# Patient Record
Sex: Female | Born: 1935 | Race: Black or African American | Hispanic: No | Smoking: Never smoker
Health system: Southern US, Community
[De-identification: ages and names within clinical notes are randomized; demographics above are authoritative.]

## PROBLEM LIST (undated history)

## (undated) DIAGNOSIS — F0280 Dementia in other diseases classified elsewhere without behavioral disturbance: Secondary | ICD-10-CM

## (undated) DIAGNOSIS — C801 Malignant (primary) neoplasm, unspecified: Secondary | ICD-10-CM

## (undated) DIAGNOSIS — I4891 Unspecified atrial fibrillation: Secondary | ICD-10-CM

## (undated) DIAGNOSIS — G309 Alzheimer's disease, unspecified: Secondary | ICD-10-CM

## (undated) HISTORY — PX: COLON SURGERY: SHX602

## (undated) HISTORY — PX: APPENDECTOMY: SHX54

## (undated) HISTORY — PX: CHOLECYSTECTOMY: SHX55

## (undated) HISTORY — PX: TONSILLECTOMY: SUR1361

---

## 1998-06-29 ENCOUNTER — Ambulatory Visit (HOSPITAL_COMMUNITY): Admission: RE | Admit: 1998-06-29 | Discharge: 1998-06-29 | Payer: Self-pay | Admitting: General Surgery

## 2000-01-01 ENCOUNTER — Ambulatory Visit (HOSPITAL_COMMUNITY): Admission: RE | Admit: 2000-01-01 | Discharge: 2000-01-01 | Payer: Self-pay | Admitting: *Deleted

## 2001-04-10 ENCOUNTER — Encounter: Payer: Self-pay | Admitting: Emergency Medicine

## 2001-04-10 ENCOUNTER — Emergency Department (HOSPITAL_COMMUNITY): Admission: EM | Admit: 2001-04-10 | Discharge: 2001-04-10 | Payer: Self-pay | Admitting: Emergency Medicine

## 2001-11-19 ENCOUNTER — Ambulatory Visit (HOSPITAL_COMMUNITY): Admission: RE | Admit: 2001-11-19 | Discharge: 2001-11-19 | Payer: Self-pay | Admitting: *Deleted

## 2002-03-17 ENCOUNTER — Emergency Department (HOSPITAL_COMMUNITY): Admission: EM | Admit: 2002-03-17 | Discharge: 2002-03-17 | Payer: Self-pay | Admitting: Emergency Medicine

## 2002-03-17 ENCOUNTER — Encounter: Payer: Self-pay | Admitting: Emergency Medicine

## 2003-11-18 ENCOUNTER — Other Ambulatory Visit: Admission: RE | Admit: 2003-11-18 | Discharge: 2003-11-18 | Payer: Self-pay | Admitting: Internal Medicine

## 2003-12-22 ENCOUNTER — Ambulatory Visit (HOSPITAL_COMMUNITY): Admission: RE | Admit: 2003-12-22 | Discharge: 2003-12-22 | Payer: Self-pay | Admitting: *Deleted

## 2003-12-22 ENCOUNTER — Encounter (INDEPENDENT_AMBULATORY_CARE_PROVIDER_SITE_OTHER): Payer: Self-pay | Admitting: Specialist

## 2004-02-15 ENCOUNTER — Ambulatory Visit (HOSPITAL_COMMUNITY): Admission: RE | Admit: 2004-02-15 | Discharge: 2004-02-15 | Payer: Self-pay | Admitting: *Deleted

## 2004-03-15 ENCOUNTER — Ambulatory Visit (HOSPITAL_COMMUNITY): Admission: RE | Admit: 2004-03-15 | Discharge: 2004-03-15 | Payer: Self-pay | Admitting: *Deleted

## 2006-12-18 ENCOUNTER — Emergency Department (HOSPITAL_COMMUNITY): Admission: EM | Admit: 2006-12-18 | Discharge: 2006-12-18 | Payer: Self-pay | Admitting: Family Medicine

## 2007-04-08 ENCOUNTER — Emergency Department (HOSPITAL_COMMUNITY): Admission: EM | Admit: 2007-04-08 | Discharge: 2007-04-08 | Payer: Self-pay | Admitting: Emergency Medicine

## 2007-12-28 ENCOUNTER — Emergency Department (HOSPITAL_COMMUNITY): Admission: EM | Admit: 2007-12-28 | Discharge: 2007-12-28 | Payer: Self-pay | Admitting: Emergency Medicine

## 2008-04-05 ENCOUNTER — Ambulatory Visit (HOSPITAL_COMMUNITY): Admission: RE | Admit: 2008-04-05 | Discharge: 2008-04-05 | Payer: Self-pay | Admitting: *Deleted

## 2010-10-10 NOTE — Op Note (Signed)
NAMEQUEEN, ABBETT NO.:  [id number]   MEDICAL RECORD NO.:  [id number]          PATIENT TYPE:  AMB   LOCATION:  ENDO                         FACILITY:  [facility]   PHYSICIAN:  Georgiana Spinner, M.D.    DATE OF BIRTH:  [birth year]   DATE OF PROCEDURE:  04/05/2008  DATE OF DISCHARGE:                               OPERATIVE REPORT   PROCEDURE:  Colonoscopy.   INDICATIONS:  Colon cancer follow-up.   ANESTHESIA:  Fentanyl 60 mcg, Versed 5 mg.   PROCEDURE:  With the patient mildly sedated in the left lateral  decubitus position, the Pentax videoscopic pediatric colonoscope was  inserted in the rectum and passed under direct vision to the cecum,  identified by ileocecal valve and appendiceal orifice, both which were  photographed.  From this point the colonoscope was slowly withdrawn  taking circumferential views of colonic mucosa stopping only in the  rectum which appeared normal on direct.  Tried to get a retroflexed view  but could not and decided not to proceed further.  The endoscope was  withdrawn.  The patient's vital signs, pulse oximeter remained stable.  The patient tolerated procedure well without apparent complication.   FINDINGS:  Negative examination.   PLAN:  Probably would not to any further screening, but will discuss  this in 5 years with the patient.           ______________________________  Georgiana Spinner, M.D.     GMO/MEDQ  D:  04/05/2008  T:  04/06/2008  Job:  [id number]

## 2010-10-13 NOTE — Op Note (Signed)
NAME:  Kathleen [name], Kathleen [name]                           ACCOUNT NO.:  [id number]   MEDICAL RECORD NO.:  [id number]                   PATIENT TYPE:  AMB   LOCATION:  ENDO                                 FACILITY:  [facility]   PHYSICIAN:  Georgiana Spinner, M.D.                 DATE OF BIRTH:  06-20-1925   DATE OF PROCEDURE:  12/22/2003  DATE OF DISCHARGE:                                 OPERATIVE REPORT   PROCEDURE:  Colonoscopy.   INDICATIONS:  Rectal bleeding.   ANESTHESIA:  1. Demerol 20.  2. Versed 2 mg.   DESCRIPTION OF PROCEDURE:  With patient mildly sedated in the left lateral  decubitus position, the Olympus videoscopic colonoscope was inserted in the  rectum and passed under direct vision to the cecum, identified by the  ileocecal valve and base of cecum.  From this point, the colonoscope was  slowly withdrawn, taking circumferential views of the colonic mucosa,  stopping in the rectum which appeared somewhat thickened but without  abnormal mucosa or mass.  This was photographed on direct view.  I attempted  briefly a retroflexed view, but the lumen was not capable of handling this,  so I just pulled back into the anal canal and photographed hemorrhoids that  were seen.  The endoscope was withdrawn.  The patient's vital signs and  pulse oximetry remained stable.  The patient tolerated the procedure well  without apparent complications.   FINDINGS:  Status post colonic resection for cancer with somewhat thickened  rectum which does not appear to be pathologic internal hemorrhoids.   PLAN:  1. See endoscopy note for further details.  2. Will have patient follow up with me as an outpatient, consider further     studies depending on patient's clinical situation.                                               Georgiana Spinner, M.D.    GMO/MEDQ  D:  12/22/2003  T:  12/22/2003  Job:  [id number]

## 2010-10-13 NOTE — Op Note (Signed)
NAME:  Kathleen [name], Kathleen [name]                           ACCOUNT NO.:  [id number]   MEDICAL RECORD NO.:  [id number]                   PATIENT TYPE:  AMB   LOCATION:  ENDO                                 FACILITY:  [facility]   PHYSICIAN:  Georgiana Spinner, M.D.                 DATE OF BIRTH:  [birth year]   DATE OF PROCEDURE:  DATE OF DISCHARGE:                                 OPERATIVE REPORT   PROCEDURE:  Upper endoscopy.   INDICATIONS FOR PROCEDURE:  Abdominal pain, rectal bleeding.   ANESTHESIA:  Demerol 40, Versed 4 mg.   DESCRIPTION OF PROCEDURE:  With the patient mildly sedated in the left  lateral decubitus position, the Olympus videoscopic endoscope was inserted  in the mouth and passed under direct vision through the esophagus which  appeared normal until we reached the distal esophagus and there were changes  of some mild __________ of the gastroesophageal junction.  It was not  uniform and this was photographed and biopsied. We entered into the stomach.  The fundus, body, antrum, duodenal bulb and second portion of the duodenum  were visualized.  From this point, the endoscope was slowly withdrawn taking  circumferential views of the duodenal mucosa until the endoscope was then  pulled back in the stomach, placed in retroflexion to view the stomach from  below. The endoscope was straightened and withdrawn taking circumferential  views of the remaining gastric and esophageal mucosa. The patient's vital  signs and pulse oximeter remained stable. The patient tolerated the  procedure well without apparent complications.   FINDINGS:  Changes of the distal esophagus biopsied. Await biopsy report.  The patient will call me for results and followup with me as an outpatient.  Proceed to colonoscopy as planned.                                               Georgiana Spinner, M.D.    GMO/MEDQ  D:  12/22/2003  T:  12/22/2003  Job:  [id number]

## 2010-10-13 NOTE — Procedures (Signed)
[facility]  Patient:    Kathleen [name], PUCCIO                        MRN: [id number] Proc. Date: 01/01/00 Adm. Date:  [id number] Attending:  Sabino Gasser                           Procedure Report  PROCEDURE PERFORMED:  Colonoscopy.  ENDOSCOPIST:  Sabino Gasser, M.D.  INDICATIONS:  Colon cancer with rectal bleeding.  ANESTHESIA:  Demerol 60 mg and Versed 5 mg given intravenously in divided doses.  DESCRIPTION OF PROCEDURE:  With the patient mildly sedated in the left lateral decubitus position, the Olympus videoscopic pediatric colonoscope was inserted into the rectum and passed under direct vision to the cecum.  The cecum identified, right ileocecal valve and appendiceal orifice both of which were photographed.  From this point, the colonoscope was slowly withdrawn taking circumferential views of the entire colonic mucosa.  We pulled through the surgical anastomosis spot which was somewhat narrowed but smooth without mucosal lesions noted and this was done until we reached the rectum which appeared normal under direct view at first and then we could see the hemorrhoids.  We did not attempt a retroflex view.  The patient did not hold the air well, so therefore this was not attempted and visualized this area quite well in straight view.  The endoscope was withdrawn.  The patients vital signs and pulse oximeter remained stable.  The patient tolerated the procedure well without apparent complications.  FINDINGS:  Hemorrhoids.  A smooth tapered stricture at anastomosis site otherwise unremarkable examination.  PLAN:  Treat symptomatically. DD:  01/01/00 TD:  01/01/00 Job: [id number]

## 2010-10-13 NOTE — Procedures (Signed)
[facility]  Patient:    Kathleen [name], Kathleen [name] Visit Number: [id number] MRN: [id number]          Service Type: END Location: ENDO Attending Physician:  Sabino Gasser Dictated by:   Sabino Gasser, M.D. Proc. Date: 11/19/01 Admit Date:  11/19/2001   CC:         Samul Dada, M.D.  Mardene Celeste Lurene Shadow, M.D.   Procedure Report  PROCEDURE:  Colonoscopy.  SURGEON:  Sabino Gasser, M.D.  INDICATIONS:  Followup of colon cancer.  ANESTHESIA:  Demerol 60 and Versed 6 mg.  PREPARATION:  Visicol tablets and result was very good and excellent.  DESCRIPTION OF PROCEDURE:  With the patient mildly sedated in the left lateral decubitus position, the Olympus videoscopic colonoscope was inserted in the rectum, and passed under direct vision to the cecum, identified by the ileocecal valve and appendiceal orifice, the former of which was photographed. From this point, the colonoscope was slowly withdrawn, taking circumferential views of the remaining colonic mucosa, stopping only in the proximal rectum where it was scarred from the previous anastomosis was well-seen.  No abnormalities were noted except there was some mild modeling, presumably secondary to radiation changes, and we withdrew all the way to the distal rectum.  I elected not to place the endoscope in retroflexion due to the smallness of the residual rectum and the scope was withdrawn.  The patients vital signs and pulse oximeter remained stable.  The patient tolerated the procedure well and without apparent complications.  FINDINGS:  Negative postoperative colonoscopic examination to the cecum.  PLAN:  Repeat examination and have the patient seen as an outpatient in approximately two to three years. Dictated by:   Sabino Gasser, M.D. Attending Physician:  Sabino Gasser DD:  11/19/01 TD:  11/20/01 Job: [id number]

## 2010-12-18 ENCOUNTER — Ambulatory Visit (INDEPENDENT_AMBULATORY_CARE_PROVIDER_SITE_OTHER): Payer: BC Managed Care – PPO

## 2010-12-18 ENCOUNTER — Inpatient Hospital Stay (INDEPENDENT_AMBULATORY_CARE_PROVIDER_SITE_OTHER)
Admission: RE | Admit: 2010-12-18 | Discharge: 2010-12-18 | Disposition: A | Discharge status: Home / Self Care | Payer: BC Managed Care – PPO | Source: Ambulatory Visit | Attending: Emergency Medicine | Admitting: Emergency Medicine

## 2010-12-18 ENCOUNTER — Other Ambulatory Visit (HOSPITAL_COMMUNITY): Payer: Self-pay | Admitting: Emergency Medicine

## 2010-12-18 DIAGNOSIS — W19XXXA Unspecified fall, initial encounter: Secondary | ICD-10-CM

## 2010-12-18 DIAGNOSIS — S20219A Contusion of unspecified front wall of thorax, initial encounter: Secondary | ICD-10-CM

## 2010-12-18 DIAGNOSIS — R319 Hematuria, unspecified: Secondary | ICD-10-CM

## 2010-12-18 LAB — POCT URINALYSIS DIP (DEVICE)
Leukocytes, UA: NEGATIVE
Nitrite: NEGATIVE
Protein, ur: NEGATIVE mg/dL
pH: 5.5 (ref 5.0–8.0)

## 2012-06-12 ENCOUNTER — Emergency Department (HOSPITAL_COMMUNITY): Payer: Medicare Other

## 2012-06-12 ENCOUNTER — Encounter (HOSPITAL_COMMUNITY): Payer: Self-pay | Admitting: Emergency Medicine

## 2012-06-12 ENCOUNTER — Inpatient Hospital Stay (HOSPITAL_COMMUNITY): Payer: Medicare Other

## 2012-06-12 ENCOUNTER — Inpatient Hospital Stay (HOSPITAL_COMMUNITY)
Admission: EM | Admit: 2012-06-12 | Discharge: 2012-06-15 | DRG: 689 | Disposition: A | Discharge status: Home Health | Payer: Medicare Other | Attending: Internal Medicine | Admitting: Internal Medicine

## 2012-06-12 DIAGNOSIS — R509 Fever, unspecified: Secondary | ICD-10-CM

## 2012-06-12 DIAGNOSIS — R6889 Other general symptoms and signs: Secondary | ICD-10-CM

## 2012-06-12 DIAGNOSIS — Z9049 Acquired absence of other specified parts of digestive tract: Secondary | ICD-10-CM

## 2012-06-12 DIAGNOSIS — Z85038 Personal history of other malignant neoplasm of large intestine: Secondary | ICD-10-CM | POA: Diagnosis present

## 2012-06-12 DIAGNOSIS — F0390 Unspecified dementia without behavioral disturbance: Secondary | ICD-10-CM

## 2012-06-12 DIAGNOSIS — R197 Diarrhea, unspecified: Secondary | ICD-10-CM | POA: Diagnosis present

## 2012-06-12 DIAGNOSIS — N390 Urinary tract infection, site not specified: Principal | ICD-10-CM

## 2012-06-12 DIAGNOSIS — R4182 Altered mental status, unspecified: Secondary | ICD-10-CM

## 2012-06-12 DIAGNOSIS — K649 Unspecified hemorrhoids: Secondary | ICD-10-CM | POA: Diagnosis present

## 2012-06-12 DIAGNOSIS — E876 Hypokalemia: Secondary | ICD-10-CM | POA: Diagnosis not present

## 2012-06-12 DIAGNOSIS — F0280 Dementia in other diseases classified elsewhere without behavioral disturbance: Secondary | ICD-10-CM | POA: Diagnosis present

## 2012-06-12 DIAGNOSIS — Z9089 Acquired absence of other organs: Secondary | ICD-10-CM

## 2012-06-12 DIAGNOSIS — G309 Alzheimer's disease, unspecified: Secondary | ICD-10-CM | POA: Diagnosis present

## 2012-06-12 DIAGNOSIS — G9340 Encephalopathy, unspecified: Secondary | ICD-10-CM

## 2012-06-12 DIAGNOSIS — R5381 Other malaise: Secondary | ICD-10-CM | POA: Diagnosis present

## 2012-06-12 HISTORY — DX: Alzheimer's disease, unspecified: G30.9

## 2012-06-12 HISTORY — DX: Malignant (primary) neoplasm, unspecified: C80.1

## 2012-06-12 HISTORY — DX: Dementia in other diseases classified elsewhere, unspecified severity, without behavioral disturbance, psychotic disturbance, mood disturbance, and anxiety: F02.80

## 2012-06-12 LAB — CBC WITH DIFFERENTIAL/PLATELET
Basophils Absolute: 0 10*3/uL (ref 0.0–0.1)
Basophils Absolute: 0 10*3/uL (ref 0.0–0.1)
Basophils Relative: 0 % (ref 0–1)
Eosinophils Absolute: 0 10*3/uL (ref 0.0–0.7)
Eosinophils Absolute: 0 10*3/uL (ref 0.0–0.7)
HCT: 34.5 % — ABNORMAL LOW (ref 36.0–46.0)
HCT: 36.3 % (ref 36.0–46.0)
Lymphs Abs: 1.5 10*3/uL (ref 0.7–4.0)
MCH: 29 pg (ref 26.0–34.0)
MCH: 29 pg (ref 26.0–34.0)
MCHC: 32.8 g/dL (ref 30.0–36.0)
MCHC: 32.8 g/dL (ref 30.0–36.0)
MCV: 88.5 fL (ref 78.0–100.0)
Monocytes Absolute: 0.5 10*3/uL (ref 0.1–1.0)
Monocytes Absolute: 0.6 10*3/uL (ref 0.1–1.0)
Neutro Abs: 5.2 10*3/uL (ref 1.7–7.7)
Neutro Abs: 6.1 10*3/uL (ref 1.7–7.7)
Neutrophils Relative %: 77 % (ref 43–77)
RDW: 14.7 % (ref 11.5–15.5)
RDW: 14.6 % (ref 11.5–15.5)

## 2012-06-12 LAB — URINALYSIS, ROUTINE W REFLEX MICROSCOPIC
Ketones, ur: 15 mg/dL — AB
Nitrite: NEGATIVE
Protein, ur: 30 mg/dL — AB
Urobilinogen, UA: 4 mg/dL — ABNORMAL HIGH (ref 0.0–1.0)
pH: 6 (ref 5.0–8.0)

## 2012-06-12 LAB — POCT I-STAT, CHEM 8
Calcium, Ion: 1.16 mmol/L (ref 1.13–1.30)
Chloride: 100 mEq/L (ref 96–112)
Glucose, Bld: 124 mg/dL — ABNORMAL HIGH (ref 70–99)
HCT: 36 % (ref 36.0–46.0)
Hemoglobin: 12.2 g/dL (ref 12.0–15.0)
TCO2: 29 mmol/L (ref 0–100)

## 2012-06-12 LAB — COMPREHENSIVE METABOLIC PANEL
AST: 58 U/L — ABNORMAL HIGH (ref 0–37)
Albumin: 2.1 g/dL — ABNORMAL LOW (ref 3.5–5.2)
Alkaline Phosphatase: 187 U/L — ABNORMAL HIGH (ref 39–117)
BUN: 8 mg/dL (ref 6–23)
Chloride: 97 mEq/L (ref 96–112)
Potassium: 3.4 mEq/L — ABNORMAL LOW (ref 3.5–5.1)
Total Bilirubin: 1 mg/dL (ref 0.3–1.2)

## 2012-06-12 LAB — LIPASE, BLOOD: Lipase: 16 U/L (ref 11–59)

## 2012-06-12 LAB — URINE MICROSCOPIC-ADD ON

## 2012-06-12 LAB — INFLUENZA PANEL BY PCR (TYPE A & B): Influenza B By PCR: NEGATIVE

## 2012-06-12 LAB — CG4 I-STAT (LACTIC ACID): Lactic Acid, Venous: 1.55 mmol/L (ref 0.5–2.2)

## 2012-06-12 MED ORDER — DEXTROSE 5 % IV SOLN
1.0000 g | INTRAVENOUS | Status: DC
  Administered 2012-06-13 – 2012-06-14 (×2): 1 g via INTRAVENOUS
  Filled 2012-06-12 (×2): qty 10

## 2012-06-12 MED ORDER — IOHEXOL 300 MG/ML  SOLN: 100.0000 mL | Freq: Once | INTRAMUSCULAR | Status: AC | PRN

## 2012-06-12 MED ORDER — ACETAMINOPHEN 650 MG RE SUPP
650.0000 mg | Freq: Once | RECTAL | Status: AC
  Administered 2012-06-12: 650 mg via RECTAL
  Filled 2012-06-12: qty 1

## 2012-06-12 MED ORDER — SODIUM CHLORIDE 0.9 % IV SOLN: INTRAVENOUS | Status: AC

## 2012-06-12 MED ORDER — DEXTROSE 5 % IV SOLN
1.0000 g | Freq: Once | INTRAVENOUS | Status: AC
  Administered 2012-06-12: 1 g via INTRAVENOUS
  Filled 2012-06-12: qty 10

## 2012-06-12 MED ORDER — ACETAMINOPHEN 325 MG PO TABS
650.0000 mg | ORAL_TABLET | Freq: Four times a day (QID) | ORAL | Status: DC | PRN
  Administered 2012-06-12: 650 mg via ORAL
  Filled 2012-06-12: qty 2

## 2012-06-12 MED ORDER — ONDANSETRON HCL 4 MG/2ML IJ SOLN: 4.0000 mg | Freq: Four times a day (QID) | INTRAMUSCULAR | Status: DC | PRN

## 2012-06-12 MED ORDER — ACETAMINOPHEN 650 MG RE SUPP
650.0000 mg | Freq: Four times a day (QID) | RECTAL | Status: DC | PRN
  Administered 2012-06-13: 650 mg via RECTAL
  Filled 2012-06-12: qty 1

## 2012-06-12 MED ORDER — IOHEXOL 300 MG/ML  SOLN
80.0000 mL | Freq: Once | INTRAMUSCULAR | Status: AC | PRN
  Administered 2012-06-12: 07:00:00 via INTRAVENOUS

## 2012-06-12 MED ORDER — IOHEXOL 300 MG/ML  SOLN
50.0000 mL | Freq: Once | INTRAMUSCULAR | Status: AC | PRN
  Administered 2012-06-12: 50 mL via ORAL

## 2012-06-12 MED ORDER — ONDANSETRON HCL 4 MG PO TABS: 4.0000 mg | ORAL_TABLET | Freq: Four times a day (QID) | ORAL | Status: DC | PRN

## 2012-06-12 NOTE — ED Notes (Signed)
CT advised that patient IV was out upon arrival for CT.  They further advised that they tried x 3 to get a line in patient without success.    IV team called and will go straight to CT to place line.  IV antibiotics due, but will not be able to place on patient until she returns with line.

## 2012-06-12 NOTE — ED Notes (Signed)
This RN spoke with Dr. Rennis Chris regarding placing a EJ in pt so that she can receive CT, antibiotics, and go to floor. States that he will speak with Dr. Ranae Palms who is the MD on POD [facility] where pt is currently at.

## 2012-06-12 NOTE — ED Notes (Addendum)
Talked with Ranae Palms, he advised that he doesn't have patient, as patient was supposed to be going upstairs.  He advised that I call Dr. Benjamine Mola, attending.  I spoke with Dr. Benjamine Mola and she wants IV access in ED (central line or other) so we can complete a round of antibiotics and get CT's taken care of.  Per IV team, backed up on PICC lines and will be hours before we can get.  Trying to get either a line via doppler or EJ or central line in ED.

## 2012-06-12 NOTE — ED Provider Notes (Signed)
History     CSN: [id number]  Arrival date & time 06/12/12  0058   First MD Initiated Contact with Patient 06/12/12 0120      Chief Complaint  Patient presents with  . Fatigue  . Fever    103.8 Tempanic  . Abdominal Pain    (Consider location/radiation/quality/duration/timing/severity/associated sxs/prior treatment) Patient is a 79 y.o. female presenting with fever and abdominal pain. The history is provided by a caregiver. The history is limited by the condition of the patient. No language interpreter was used.  Fever Primary symptoms of the febrile illness include fever, abdominal pain and altered mental status. Primary symptoms do not include cough, vomiting or diarrhea. The current episode started yesterday. This is a new problem. The problem has not changed since onset. The fever began yesterday. The fever has been unchanged since its onset. The maximum temperature recorded prior to her arrival was 103 to 104 F.  The abdominal pain began yesterday. The abdominal pain has been unchanged since its onset. The abdominal pain is located in the suprapubic region.  The change in mental status began yesterday. The altered mental status developed insidiously. The change in mental status has been unchanged since its onset. The change in mental status includes confusion.  Risk factors: none.Primary symptoms comment: global weakness  Abdominal Pain The primary symptoms of the illness include abdominal pain and fever. The primary symptoms of the illness do not include vomiting or diarrhea. Primary symptoms comment: global weakness    Past Medical History  Diagnosis Date  . Cancer colon   . Alzheimer's dementia     Past Surgical History  Procedure Date  . Colon surgery     No family history on file.  History  Substance Use Topics  . Smoking status: Never Smoker   . Smokeless tobacco: Not on file  . Alcohol Use: No    OB History    Grav Para Term Preterm Abortions TAB SAB Ect  Mult Living                  Review of Systems  Unable to perform ROS Constitutional: Positive for fever.  Respiratory: Negative for cough.   Gastrointestinal: Positive for abdominal pain. Negative for vomiting and diarrhea.  Psychiatric/Behavioral: Positive for altered mental status.    Allergies  Review of patient's allergies indicates no known allergies.  Home Medications   Current Outpatient Rx  Name  Route  Sig  Dispense  Refill  . DONEPEZIL HCL 10 MG PO TABS   Oral   Take 10 mg by mouth at bedtime.         Marland Kitchen MEMANTINE HCL ER 28 MG PO CP24   Oral   Take 1 capsule by mouth daily.           BP 131/70  Pulse 76  Temp 99.8 F (37.7 C) (Rectal)  Resp 12  SpO2 98%  Physical Exam  Constitutional: She appears well-developed and well-nourished. No distress.  HENT:  Head: Normocephalic and atraumatic.  Mouth/Throat: Oropharynx is clear and moist.  Eyes: Conjunctivae normal are normal. Pupils are equal, round, and reactive to light.  Neck: Normal range of motion. Neck supple.  Cardiovascular: Normal rate, regular rhythm and intact distal pulses.   Pulmonary/Chest: Effort normal and breath sounds normal. No stridor. She has no wheezes. She has no rales.  Abdominal: Soft. Bowel sounds are normal. There is no tenderness. There is no rebound and no guarding.  Musculoskeletal: Normal range of motion. She  exhibits no edema.  Lymphadenopathy:    She has no cervical adenopathy.  Neurological: She is alert. She has normal reflexes.  Skin: Skin is warm and dry.  Psychiatric: She has a normal mood and affect.    ED Course  Procedures (including critical care time)  Labs Reviewed  CBC WITH DIFFERENTIAL - Abnormal; Notable for the following:    Hemoglobin 11.3 (*)     HCT 34.5 (*)     All other components within normal limits  URINALYSIS, ROUTINE W REFLEX MICROSCOPIC - Abnormal; Notable for the following:    Color, Urine ORANGE (*)  BIOCHEMICALS MAY BE AFFECTED BY  COLOR   APPearance CLOUDY (*)     Hgb urine dipstick MODERATE (*)     Bilirubin Urine MODERATE (*)     Ketones, ur 15 (*)     Protein, ur 30 (*)     Urobilinogen, UA 4.0 (*)     Leukocytes, UA SMALL (*)     All other components within normal limits  POCT I-STAT, CHEM 8 - Abnormal; Notable for the following:    Glucose, Bld 124 (*)     All other components within normal limits  URINE MICROSCOPIC-ADD ON - Abnormal; Notable for the following:    Casts HYALINE CASTS (*)     All other components within normal limits  CG4 I-STAT (LACTIC ACID)  CULTURE, BLOOD (ROUTINE X 2)  CULTURE, BLOOD (ROUTINE X 2)   Dg Chest 2 View  06/12/2012  *RADIOLOGY REPORT*  Clinical Data: Fever.  Lethargy.  Lower abdominal pain.  CHEST - 2 VIEW  Comparison: 01/08/2012  Findings: Borderline heart size with normal pulmonary vascularity. Peribronchial thickening and central interstitial changes suggest chronic bronchitis.  No focal airspace consolidation.  No blunting of costophrenic angles.  No pneumothorax.  Mediastinal contours appear intact.  Tortuous aorta.  Degenerative changes in the spine. No significant change since previous study.  IMPRESSION: Chronic bronchitic changes.  No evidence of active consolidation.   Original Report Authenticated By: Burman Nieves, M.D.      1. UTI (lower urinary tract infection)   2. Altered mental state       MDM  UTI fever, will CT will require admission        Naydelin Ziegler K Stephaie Dardis-Rasch, MD 06/12/12 [id number]

## 2012-06-12 NOTE — ED Notes (Signed)
Per Misty Stanley, IV team, she was unable to get access on patient after multiple attempts.  Will try to contact MD to doppler patient and to advise if waiting on CT or sending to floor.

## 2012-06-12 NOTE — ED Notes (Signed)
Ct brought patient back to be cleaned up.  Patient had two episodes of diarrhea while in CT.  Patient cleaned and IV team at bedside to gain good access.

## 2012-06-12 NOTE — ED Notes (Signed)
CT notified that patient can get scanned now.

## 2012-06-12 NOTE — H&P (Signed)
Kathleen [name] is an 79 y.o. female.  Patient was seen and examined on June 12, 2012. PCP - Dr. Pearson Grippe. History obtained from patient's caregiver and ER physician.  Chief Complaint: Confusion and fever. HPI: 79 year-old female with history of advanced dementia, previous history of colon cancer status post surgery was brought to the ER by patient's caregiver after patient was found to be increasingly confused complaining of abdominal pain and right leg pain and was found to be febrile. In the ER patient was found to be having a fever of 103F and UA was compatible UTI. Chest x-ray was only showing features of bronchitis. Patient did not have any shortness of breath cough or chest pain. As per the caregiver patient's mental status is at the baseline. Patient at this time does not complain of any abdominal pain. Not sure the exact location of the abdomen pain. Patient is able to move extremities without any pain at this time. Patient has been admitted for further management.  Past Medical History  Diagnosis Date  . Cancer colon   . Alzheimer's dementia     Past Surgical History  Procedure Date  . Colon surgery   . Appendectomy   . Cholecystectomy   . Tonsillectomy     History reviewed. No pertinent family history. Social History:  reports that she has never smoked. She does not have any smokeless tobacco history on file. She reports that she does not drink alcohol. Her drug history not on file.  Allergies: No Known Allergies   (Not in a hospital admission)  Results for orders placed during the hospital encounter of 06/12/12 (from the past 48 hour(s))  CBC WITH DIFFERENTIAL     Status: Abnormal   Collection Time   06/12/12  1:36 AM      Component Value Range Comment   WBC 6.8  4.0 - 10.5 K/uL    RBC 3.90  3.87 - 5.11 MIL/uL    Hemoglobin 11.3 (*) 12.0 - 15.0 g/dL    HCT 16.1 (*) 09.6 - 46.0 %    MCV 88.5  78.0 - 100.0 fL    MCH 29.0  26.0 - 34.0 pg    MCHC 32.8  30.0 - 36.0  g/dL    RDW 04.5  40.9 - 81.1 %    Platelets 207  150 - 400 K/uL    Neutrophils Relative 77  43 - 77 %    Neutro Abs 5.2  1.7 - 7.7 K/uL    Lymphocytes Relative 15  12 - 46 %    Lymphs Abs 1.0  0.7 - 4.0 K/uL    Monocytes Relative 8  3 - 12 %    Monocytes Absolute 0.5  0.1 - 1.0 K/uL    Eosinophils Relative 0  0 - 5 %    Eosinophils Absolute 0.0  0.0 - 0.7 K/uL    Basophils Relative 0  0 - 1 %    Basophils Absolute 0.0  0.0 - 0.1 K/uL   URINALYSIS, ROUTINE W REFLEX MICROSCOPIC     Status: Abnormal   Collection Time   06/12/12  2:00 AM      Component Value Range Comment   Color, Urine ORANGE (*) YELLOW BIOCHEMICALS MAY BE AFFECTED BY COLOR   APPearance CLOUDY (*) CLEAR    Specific Gravity, Urine 1.026  1.005 - 1.030    pH 6.0  5.0 - 8.0    Glucose, UA NEGATIVE  NEGATIVE mg/dL    Hgb urine dipstick  MODERATE (*) NEGATIVE    Bilirubin Urine MODERATE (*) NEGATIVE    Ketones, ur 15 (*) NEGATIVE mg/dL    Protein, ur 30 (*) NEGATIVE mg/dL    Urobilinogen, UA 4.0 (*) 0.0 - 1.0 mg/dL    Nitrite NEGATIVE  NEGATIVE    Leukocytes, UA SMALL (*) NEGATIVE   URINE MICROSCOPIC-ADD ON     Status: Abnormal   Collection Time   06/12/12  2:00 AM      Component Value Range Comment   Squamous Epithelial / LPF RARE  RARE    WBC, UA 3-6  <3 WBC/hpf    RBC / HPF 7-10  <3 RBC/hpf    Bacteria, UA RARE  RARE    Casts HYALINE CASTS (*) NEGATIVE    Urine-Other MUCOUS PRESENT     POCT I-STAT, CHEM 8     Status: Abnormal   Collection Time   06/12/12  2:06 AM      Component Value Range Comment   Sodium 138  135 - 145 mEq/L    Potassium 3.9  3.5 - 5.1 mEq/L    Chloride 100  96 - 112 mEq/L    BUN 10  6 - 23 mg/dL    Creatinine, Ser 7.82  0.50 - 1.10 mg/dL    Glucose, Bld 956 (*) 70 - 99 mg/dL    Calcium, Ion 2.13  0.86 - 1.30 mmol/L    TCO2 29  0 - 100 mmol/L    Hemoglobin 12.2  12.0 - 15.0 g/dL    HCT 57.8  46.9 - 62.9 %   CG4 I-STAT (LACTIC ACID)     Status: Normal   Collection Time   06/12/12  2:07  AM      Component Value Range Comment   Lactic Acid, Venous 1.55  0.5 - 2.2 mmol/L    Dg Chest 2 View  06/12/2012  *RADIOLOGY REPORT*  Clinical Data: Fever.  Lethargy.  Lower abdominal pain.  CHEST - 2 VIEW  Comparison: 01/08/2012  Findings: Borderline heart size with normal pulmonary vascularity. Peribronchial thickening and central interstitial changes suggest chronic bronchitis.  No focal airspace consolidation.  No blunting of costophrenic angles.  No pneumothorax.  Mediastinal contours appear intact.  Tortuous aorta.  Degenerative changes in the spine. No significant change since previous study.  IMPRESSION: Chronic bronchitic changes.  No evidence of active consolidation.   Original Report Authenticated By: Burman Nieves, M.D.     Review of Systems  Constitutional: Positive for fever.  HENT: Negative.   Eyes: Negative.   Respiratory: Negative.   Cardiovascular: Negative.   Gastrointestinal: Positive for abdominal pain.  Genitourinary: Negative.   Musculoskeletal: Negative.   Skin: Negative.   Neurological:       Increasing confusion.  Endo/Heme/Allergies: Negative.   Psychiatric/Behavioral: Negative.     Blood pressure 131/70, pulse 76, temperature 99.8 F (37.7 C), temperature source Rectal, resp. rate 12, SpO2 98.00%. Physical Exam  Constitutional: She appears well-developed.       Moderately nourished.  HENT:  Head: Normocephalic and atraumatic.  Eyes: Conjunctivae normal are normal. Pupils are equal, round, and reactive to light. Right eye exhibits no discharge. Left eye exhibits no discharge. No scleral icterus.  Neck: Normal range of motion. Neck supple.  Cardiovascular: Normal rate and regular rhythm.   Respiratory: Effort normal and breath sounds normal. No respiratory distress. She has no wheezes. She has no rales.  GI: Soft. Bowel sounds are normal. She exhibits no distension. There is no tenderness. There  is no rebound.  Musculoskeletal: She exhibits no edema.    Neurological: She is alert.       Does not follow command. Not oriented to name place or person.  Skin: Skin is warm.     Assessment/Plan #1. Acute encephalopathy probably secondary to fever - as per the caregiver patient's mental status is back to baseline. Care was not sure if patient had a fall but has had episodes where she had imbalance and difficulty walking for which at this time a CT head has been ordered results pending.  #2. Fever probably from UTI - urine cultures have been ordered and patient has been started on ceftriaxone. Check influenza PCR. #3. Advanced dementia - continue present medications once fever has decreased. #4. History of colon cancer.  At this moment CT head, CT abdomen and pelvis and x-ray pelvis and TSH and repeat labs are pending.  CODE STATUS - full code and is to be confirmed the patient's family once can be reached. I was not able to reach with the phone number provided.   06/12/2012, 6:59 AM

## 2012-06-12 NOTE — Progress Notes (Signed)
Patient admitted early this AM by Dr. Kirtland Bouchard.  Having diarrhea check c diff no recent abx per patient but she is demented.  Trying to get IV access.  +UTI.  Await CT abd/pelvis.  Marlin Canary DO

## 2012-06-12 NOTE — ED Notes (Signed)
Patient transported by [facility] from home with a complaint of fever, lethargic and lower abdominal pain since yesterday morning.  Caregiver at home states she has been more lethargic and not eating well since yesterday morning.

## 2012-06-12 NOTE — ED Notes (Signed)
IV antibiotics -waiting for pump.

## 2012-06-13 DIAGNOSIS — Z85038 Personal history of other malignant neoplasm of large intestine: Secondary | ICD-10-CM

## 2012-06-13 LAB — CBC
HCT: 32.8 % — ABNORMAL LOW (ref 36.0–46.0)
MCHC: 32 g/dL (ref 30.0–36.0)
MCV: 87.2 fL (ref 78.0–100.0)
RDW: 14.6 % (ref 11.5–15.5)

## 2012-06-13 LAB — BASIC METABOLIC PANEL
BUN: 8 mg/dL (ref 6–23)
CO2: 26 mEq/L (ref 19–32)
Chloride: 98 mEq/L (ref 96–112)
Creatinine, Ser: 0.75 mg/dL (ref 0.50–1.10)

## 2012-06-13 MED ORDER — POTASSIUM CHLORIDE CRYS ER 20 MEQ PO TBCR
40.0000 meq | EXTENDED_RELEASE_TABLET | Freq: Once | ORAL | Status: DC
  Filled 2012-06-13: qty 2

## 2012-06-13 MED ORDER — SODIUM CHLORIDE 0.9 % IV SOLN
INTRAVENOUS | Status: DC
  Administered 2012-06-13 – 2012-06-14 (×3): via INTRAVENOUS

## 2012-06-13 MED ORDER — CEFTRIAXONE SODIUM 1 G IJ SOLR: 1.0000 g | INTRAMUSCULAR | Status: DC

## 2012-06-13 MED ORDER — POTASSIUM CHLORIDE 20 MEQ/15ML (10%) PO LIQD
40.0000 meq | Freq: Every day | ORAL | Status: DC
  Administered 2012-06-14 – 2012-06-15 (×2): 40 meq via ORAL
  Filled 2012-06-13 (×3): qty 30

## 2012-06-13 MED ORDER — POTASSIUM CHLORIDE 20 MEQ/15ML (10%) PO LIQD
40.0000 meq | Freq: Every day | ORAL | Status: DC
  Filled 2012-06-13: qty 30

## 2012-06-13 NOTE — Progress Notes (Addendum)
[facility] PROGRESS NOTE  Kathleen [name] ZOX:[id number] DOB: 03/18/1926 DOA: 06/12/2012 PCP: Pearson Grippe, MD  Assessment/Plan: #1. Acute encephalopathy probably secondary to fever - as per the caregiver patient's mental status is back to baseline. Care was not sure if patient had a fall but has had episodes where she had imbalance and difficulty walking for which at this time a CT head has been ordered and negative.  #2. Fever probably from UTI - urine cultures have been ordered and patient has been started on ceftriaxone (give IM if unable to get access-pulled out EJ) . Urine cx pending.  influenza PCR negative, BC x 2 negative so far #3. Advanced dementia - continue present medications once fever has decreased.  #4. History of colon cancer #5.  Hypokalemia- repleat #6. Diarrhea- resolved   Code Status: full Family Communication: full Disposition Plan:    Consultants:    Procedures:    Antibiotics:    HPI/Subjective: Pleasant/cooperative Eating well Answers a few questions Up and moving with nursing help  Objective: Filed Vitals:   06/12/12 1115 06/12/12 1420 06/12/12 2105 06/13/12 0527  BP: 135/60 126/71 138/58 142/54  Pulse: 84 82 67 90  Temp: 101.3 F (38.5 C) 102.4 F (39.1 C) 98.6 F (37 C) 97.9 F (36.6 C)  TempSrc: Oral Oral    Resp: 18  18 18   Weight: 57.6 kg (126 lb 15.8 oz)     SpO2: 96% 97% 97% 97%    Intake/Output Summary (Last 24 hours) at 06/13/12 1241 Last data filed at 06/12/12 1843  Gross per 24 hour  Intake    120 ml  Output      0 ml  Net    120 ml   Filed Weights   06/12/12 1115  Weight: 57.6 kg (126 lb 15.8 oz)    Exam:   General:  Pleasant/cooperative  Cardiovascular: rrr  Respiratory: clear anterior  Abdomen: +BS, soft, NT/ND  Data Reviewed: Basic Metabolic Panel:  Lab 06/13/12 8119 06/12/12 1310 06/12/12 0206  NA 136 132* 138  K 3.0* 3.4* 3.9  CL 98 97 100  CO2 26 24 --  GLUCOSE 109* 94 124*  BUN 8 8 10     CREATININE 0.75 0.72 1.10  CALCIUM 9.2 9.4 --  MG -- -- --  PHOS -- -- --   Liver Function Tests:  Lab 06/12/12 1310  AST 58*  ALT 35  ALKPHOS 187*  BILITOT 1.0  PROT 7.8  ALBUMIN 2.1*    Lab 06/12/12 1310  LIPASE 16  AMYLASE --   No results found for this basename: AMMONIA:5 in the last 168 hours CBC:  Lab 06/13/12 0605 06/12/12 1310 06/12/12 0206 06/12/12 0136  WBC 9.4 8.2 -- 6.8  NEUTROABS -- 6.1 -- 5.2  HGB 10.5* 11.9* 12.2 11.3*  HCT 32.8* 36.3 36.0 34.5*  MCV 87.2 88.5 -- 88.5  PLT 247 PLATELET CLUMPS NOTED ON SMEAR, COUNT APPEARS ADEQUATE -- 207   Cardiac Enzymes: No results found for this basename: CKTOTAL:5,CKMB:5,CKMBINDEX:5,TROPONINI:5 in the last 168 hours BNP (last 3 results) No results found for this basename: PROBNP:3 in the last 8760 hours CBG: No results found for this basename: GLUCAP:5 in the last 168 hours  Recent Results (from the past 240 hour(s))  CULTURE, BLOOD (ROUTINE X 2)     Status: Normal (Preliminary result)   Collection Time   06/12/12  1:30 AM      Component Value Range Status Comment   Specimen Description BLOOD RIGHT ARM  Final    Special Requests BOTTLES DRAWN AEROBIC ONLY 5CC   Final    Culture  Setup Time 06/12/2012 08:36   Final    Culture     Final    Value:        BLOOD CULTURE RECEIVED NO GROWTH TO DATE CULTURE WILL BE HELD FOR 5 DAYS BEFORE ISSUING A FINAL NEGATIVE REPORT   Report Status PENDING   Incomplete   CULTURE, BLOOD (ROUTINE X 2)     Status: Normal (Preliminary result)   Collection Time   06/12/12  1:45 AM      Component Value Range Status Comment   Specimen Description BLOOD LEFT ARM   Final    Special Requests BOTTLES DRAWN AEROBIC ONLY 6CC   Final    Culture  Setup Time 06/12/2012 08:36   Final    Culture     Final    Value:        BLOOD CULTURE RECEIVED NO GROWTH TO DATE CULTURE WILL BE HELD FOR 5 DAYS BEFORE ISSUING A FINAL NEGATIVE REPORT   Report Status PENDING   Incomplete   CLOSTRIDIUM DIFFICILE BY  PCR     Status: Normal   Collection Time   06/12/12  4:40 PM      Component Value Range Status Comment   C difficile by pcr NEGATIVE  NEGATIVE Final      Studies: Ct Abdomen Pelvis Wo Contrast  06/12/2012  *RADIOLOGY REPORT*  Clinical Data: Abdominal pain, fever, UTI, advanced dementia  CT ABDOMEN AND PELVIS WITHOUT CONTRAST  Technique:  Multidetector CT imaging of the abdomen and pelvis was performed following the standard protocol without intravenous contrast.  Comparison: 02/15/2004  Findings: Trace bilateral pleural effusions.  Trace pericardial effusion, unchanged.  Unenhanced liver, spleen, and adrenal glands are unremarkable.  Fluid density lesion along the anterior aspect of the pancreatic head/body, measuring 1.2 x 1.4 cm, of uncertain etiology. Differential considerations include a pancreatic pseudocyst (in the setting of recent prior pancreatitis) or a pancreatic cystic lesion.  Status post cholecystectomy.  Stable prominence of the common bile duct.  Kidneys are unremarkable.  No renal calculi or hydronephrosis.  No evidence of bowel obstruction. Prior distal colonic resection. Mild thickening of the rectum (series 2/image 55), similar to 2005.  Associated presacral soft tissue thickening/fluid (series 2/image 55), grossly unchanged, suggesting radiation changes.  Atherosclerotic calcifications of the abdominal aorta and branch vessels.  2.0 x 2.6 cm fluid density lesion in the left para-aortic space (series 2/image 35), mildly increased when compared to 2005 (previously 1.8 x 2.0 cm), likely benign.  No suspicious abdominopelvic lymphadenopathy.  Status post hysterectomy.  No adnexal masses.  Bladder is within normal limits.  Degenerative changes of the visualized thoracolumbar spine.  Mild superior endplate changes at L2.  IMPRESSION: No evidence of bowel obstruction.  Prior distal colonic resection.  Stable mild thickening of the rectum with associated presacral soft tissue/stranding, likely  reflecting radiation changes.  1.4 cm cystic lesion along the anterior pancreatic head/body. Primary differential considerations include a pancreatic pseudocyst (in the setting of recent prior pancreatitis) or a pancreatic cystic lesion such as an IPMN.  If further characterization is clinically warranted, pancreatic protocol CT abdomen with/without contrast could be performed (MRI would likely be motion degraded).   Original Report Authenticated By: Charline Bills, M.D.    Dg Chest 2 View  06/12/2012  *RADIOLOGY REPORT*  Clinical Data: Fever.  Lethargy.  Lower abdominal pain.  CHEST - 2 VIEW  Comparison:  01/08/2012  Findings: Borderline heart size with normal pulmonary vascularity. Peribronchial thickening and central interstitial changes suggest chronic bronchitis.  No focal airspace consolidation.  No blunting of costophrenic angles.  No pneumothorax.  Mediastinal contours appear intact.  Tortuous aorta.  Degenerative changes in the spine. No significant change since previous study.  IMPRESSION: Chronic bronchitic changes.  No evidence of active consolidation.   Original Report Authenticated By: Burman Nieves, M.D.    Dg Pelvis 1-2 Views  06/12/2012  *RADIOLOGY REPORT*  Clinical Data: Pelvic pain, status post fall  PELVIS - 1-2 VIEW  Comparison: None.  Findings: Pelvis is rotated.  No fracture is seen.  Mild degenerative changes of the bilateral hips.  Iliac bones/sacrum are obscured by overlying oral contrast.  Surgical clips overlying the pelvis.  IMPRESSION: No fracture is seen.   Original Report Authenticated By: Charline Bills, M.D.    Ct Head Wo Contrast  06/12/2012  *RADIOLOGY REPORT*  Clinical Data: Fever, confusion, lethargy  CT HEAD WITHOUT CONTRAST  Technique:  Contiguous axial images were obtained from the base of the skull through the vertex without contrast.  Comparison: None.  Findings: No evidence of parenchymal hemorrhage or extra-axial fluid collection. No mass lesion, mass  effect, or midline shift.  No CT evidence of acute infarction.  Mild age related atrophy with secondary ventricular prominence.  Mild partial opacification of the left sphenoid sinus.  The visualized paranasal sinuses and mastoid air cells are otherwise clear.  No evidence of calvarial fracture.  IMPRESSION: No evidence of acute intracranial abnormality.   Original Report Authenticated By: Charline Bills, M.D.     Scheduled Meds:   . cefTRIAXone (ROCEPHIN)  IV  1 g Intravenous Q24H   Continuous Infusions:   Principal Problem:  *Encephalopathy acute Active Problems:  Fever  UTI (lower urinary tract infection)  History of colon cancer  Dementia    Time spent: 35    [facility], [place] Owczar[place]ak  [facility] Pager [phone]. If 8PM-8AM, please contact night-coverage at [email or web address], password [facility] 06/13/2012, 12:41 PM  LOS: 1 day

## 2012-06-14 MED ORDER — MEMANTINE HCL ER 28 MG PO CP24
1.0000 | ORAL_CAPSULE | Freq: Every day | ORAL | Status: DC
  Administered 2012-06-14: 28 mg via ORAL
  Filled 2012-06-14 (×2): qty 28

## 2012-06-14 MED ORDER — WITCH HAZEL-GLYCERIN EX PADS
MEDICATED_PAD | Freq: Three times a day (TID) | CUTANEOUS | Status: DC | PRN
  Filled 2012-06-14 (×2): qty 100

## 2012-06-14 MED ORDER — DONEPEZIL HCL 10 MG PO TABS
10.0000 mg | ORAL_TABLET | Freq: Every day | ORAL | Status: DC
  Administered 2012-06-14: 10 mg via ORAL
  Filled 2012-06-14 (×2): qty 1

## 2012-06-14 MED ORDER — CIPROFLOXACIN HCL 250 MG PO TABS
250.0000 mg | ORAL_TABLET | Freq: Two times a day (BID) | ORAL | Status: DC
  Administered 2012-06-14 – 2012-06-15 (×2): 250 mg via ORAL
  Filled 2012-06-14 (×4): qty 1

## 2012-06-14 NOTE — Progress Notes (Signed)
[facility] PROGRESS NOTE  Kathleen [name] ZOX:[id number] DOB: [birth year] DOA: 06/12/2012 PCP: Pearson Grippe, MD  Assessment/Plan: Acute encephalopathy probably secondary to fever As per the caregiver patient's daughters mental status is back to baseline. Head CT on 06/12/2012 showed no acute intracranial abnormality.    Generalized weakness Will request PT consultation as patient has some weakness, likely due to deconditioning.   Fever Improved. Probably from UTI. Urine cultures pending, patient on empiric ceftriaxone which will be transitioned to ciprofloxacin. Influenza PCR negative, BC x 2 negative to date.  Advanced dementia  Continue present medications once fever has decreased. Restart home medications.  History of colon cancer Stable.  Hypokalemia Replace as needed.   Diarrhea C. Diff PCR negative.   Hemorrhoidal pain Per family has seen Dr. Loreta Ave in the past. Sitz bath and Tucks as needed.  Code Status: Full code. Family Communication: Discussed with daughters at bedside. Disposition Plan: Continue to monitor, as patient has had fevers during the night of discharge today.  Consider discharge tomorrow with home health.  Consultants:  None  Procedures:  None  Antibiotics:  Ceftriaxone 06/12/2012 >> 06/14/2012  Ciprofloxacin 06/14/2012 >>  HPI/Subjective: Pleasantly confused.  Complaining of hemorrhoidal pain  Objective: Filed Vitals:   06/13/12 2015 06/13/12 2218 06/13/12 2222 06/14/12 0606  BP:   136/67 107/52  Pulse:   90 70  Temp: 101.5 F (38.6 C)  101.4 F (38.6 C) 99.9 F (37.7 C)  TempSrc: Oral  Oral Oral  Resp:   18 16  Height:  5\' 3"  (1.6 m)    Weight:  57.69 kg (127 lb 2.9 oz)    SpO2:   96% 98%    Intake/Output Summary (Last 24 hours) at 06/14/12 1601 Last data filed at 06/14/12 0600  Gross per 24 hour  Intake 1451.25 ml  Output      0 ml  Net 1451.25 ml   Filed Weights   06/12/12 1115 06/13/12 2218  Weight: 57.6 kg (126 lb  15.8 oz) 57.69 kg (127 lb 2.9 oz)    Exam: Physical Exam: General: Awake, Oriented to self, No acute distress. HEENT: EOMI. Neck: Supple CV: S1 and S2 Lungs: Clear to ascultation bilaterally Abdomen: Soft, Nontender, Nondistended, +bowel sounds. Ext: Good pulses. Trace edema.  Data Reviewed: Basic Metabolic Panel:  Lab 06/13/12 8119 06/12/12 1310 06/12/12 0206  NA 136 132* 138  K 3.0* 3.4* 3.9  CL 98 97 100  CO2 26 24 --  GLUCOSE 109* 94 124*  BUN 8 8 10   CREATININE 0.75 0.72 1.10  CALCIUM 9.2 9.4 --  MG -- -- --  PHOS -- -- --   Liver Function Tests:  Lab 06/12/12 1310  AST 58*  ALT 35  ALKPHOS 187*  BILITOT 1.0  PROT 7.8  ALBUMIN 2.1*    Lab 06/12/12 1310  LIPASE 16  AMYLASE --   No results found for this basename: AMMONIA:5 in the last 168 hours CBC:  Lab 06/13/12 0605 06/12/12 1310 06/12/12 0206 06/12/12 0136  WBC 9.4 8.2 -- 6.8  NEUTROABS -- 6.1 -- 5.2  HGB 10.5* 11.9* 12.2 11.3*  HCT 32.8* 36.3 36.0 34.5*  MCV 87.2 88.5 -- 88.5  PLT 247 PLATELET CLUMPS NOTED ON SMEAR, COUNT APPEARS ADEQUATE -- 207   Cardiac Enzymes: No results found for this basename: CKTOTAL:5,CKMB:5,CKMBINDEX:5,TROPONINI:5 in the last 168 hours BNP (last 3 results) No results found for this basename: PROBNP:3 in the last 8760 hours CBG: No results found for this basename: GLUCAP:5 in  the last 168 hours  Recent Results (from the past 240 hour(s))  CULTURE, BLOOD (ROUTINE X 2)     Status: Normal (Preliminary result)   Collection Time   06/12/12  1:30 AM      Component Value Range Status Comment   Specimen Description BLOOD RIGHT ARM   Final    Special Requests BOTTLES DRAWN AEROBIC ONLY 5CC   Final    Culture  Setup Time 06/12/2012 08:36   Final    Culture     Final    Value:        BLOOD CULTURE RECEIVED NO GROWTH TO DATE CULTURE WILL BE HELD FOR 5 DAYS BEFORE ISSUING [name] FINAL NEGATIVE REPORT   Report Status PENDING   Incomplete   CULTURE, BLOOD (ROUTINE X 2)     Status:  Normal (Preliminary result)   Collection Time   06/12/12  1:45 AM      Component Value Range Status Comment   Specimen Description BLOOD LEFT ARM   Final    Special Requests BOTTLES DRAWN AEROBIC ONLY 6CC   Final    Culture  Setup Time 06/12/2012 08:36   Final    Culture     Final    Value:        BLOOD CULTURE RECEIVED NO GROWTH TO DATE CULTURE WILL BE HELD FOR 5 DAYS BEFORE ISSUING [name] FINAL NEGATIVE REPORT   Report Status PENDING   Incomplete   CLOSTRIDIUM DIFFICILE BY PCR     Status: Normal   Collection Time   06/12/12  4:40 PM      Component Value Range Status Comment   C difficile by pcr NEGATIVE  NEGATIVE Final      Studies: No results found.  Scheduled Meds:    . ciprofloxacin  250 mg Oral BID  . potassium chloride  40 mEq Oral Daily   Continuous Infusions:    . sodium chloride 75 mL/hr at 06/14/12 0348    Principal Problem:  *Encephalopathy acute Active Problems:  Fever  UTI (lower urinary tract infection)  History of colon cancer  Dementia   Kathleen [name]  [facility] Pager [phone]. If 7PM-7AM, please contact night-coverage at [email or web address], password [facility] 06/14/2012, 4:01 PM  LOS: 2 days

## 2012-06-14 NOTE — Progress Notes (Signed)
Spoke to pharmacy and pharmacy will like family to bring in memantine ER.  Spoke to family at the bedside regarding bringing in memantine ER tonight or tomorrow.  Both daughters agreed to bring it back as soon as they can.

## 2012-06-15 LAB — BASIC METABOLIC PANEL
CO2: 23 mEq/L (ref 19–32)
Calcium: 9.5 mg/dL (ref 8.4–10.5)
Creatinine, Ser: 0.64 mg/dL (ref 0.50–1.10)
Glucose, Bld: 106 mg/dL — ABNORMAL HIGH (ref 70–99)

## 2012-06-15 LAB — CBC
HCT: 31 % — ABNORMAL LOW (ref 36.0–46.0)
Hemoglobin: 10 g/dL — ABNORMAL LOW (ref 12.0–15.0)
MCH: 28.5 pg (ref 26.0–34.0)
MCV: 88.3 fL (ref 78.0–100.0)
RBC: 3.51 MIL/uL — ABNORMAL LOW (ref 3.87–5.11)

## 2012-06-15 MED ORDER — WITCH HAZEL-GLYCERIN EX PADS: MEDICATED_PAD | Freq: Three times a day (TID) | CUTANEOUS | Status: DC | PRN

## 2012-06-15 MED ORDER — CIPROFLOXACIN HCL 250 MG PO TABS: 250.0000 mg | ORAL_TABLET | Freq: Two times a day (BID) | ORAL | Status: DC

## 2012-06-15 NOTE — Discharge Planning (Signed)
Patient discharged home in stable condition. Daughter verbalizes understanding of all discharge instructions, including home medications and follow up appointments. 

## 2012-06-15 NOTE — Progress Notes (Signed)
NCM spoke to pt's daughter, Cleda Mccreedy # [phone]. States pt lives at home alone. They plan to assist with her care at home. Alternate contact, Roselie Skinner # [phone]. Offered choice to dtr for [facility]. Request for [facility]. Faxed orders and facesheet to [facility]. Isidoro Donning RN CCM Case Mgmt phone [phone]

## 2012-06-15 NOTE — Progress Notes (Signed)
[facility] PROGRESS NOTE  Kathleen [name] ZOX:[id number] DOB: 1925/07/21 DOA: 06/12/2012 PCP: Pearson Grippe, MD  Assessment/Plan: Acute encephalopathy probably secondary to fever As per patient's daughters mental status is back to baseline. Head CT on 06/12/2012 showed no acute intracranial abnormality.   Generalized weakness Request PT consultation as patient has some weakness, likely due to deconditioning. PT has not had [name] chance to evaluate the patient. Request home health PT.   Fever Improved. Probably from UTI. Urine cultures unfortunately not sent. Patient was on empiric ceftriaxone which will be transitioned to ciprofloxacin. Influenza PCR negative, BC x 2 negative to date.  Advanced dementia  Continue present medications once fever has decreased. Restart home medications.  History of colon cancer Stable.  Hypokalemia Resolved with replacement.   Diarrhea C. Diff PCR negative.   Hemorrhoidal pain Per family has seen Dr. Loreta Ave in the past. Sitz bath and Tucks as needed.  Code Status: Full code. Family Communication: Discussed with daughters at bedside. Disposition Plan: Discharge the patient with home health services.  Consultants:  None  Procedures:  None  Antibiotics:  Ceftriaxone 06/12/2012 >> 06/14/2012  Ciprofloxacin 06/14/2012 >>  HPI/Subjective: Pleasantly confused.  No specific complaints.  Objective: Filed Vitals:   06/14/12 0606 06/14/12 1742 06/14/12 2224 06/15/12 0525  BP: 107/52  142/61 146/54  Pulse: 70  80 80  Temp: 99.9 F (37.7 C) 98 F (36.7 C) 100.2 F (37.9 C) 99.7 F (37.6 C)  TempSrc: Oral Oral Oral   Resp: 16  17 17   Height:      Weight:      SpO2: 98%  98% 95%    Intake/Output Summary (Last 24 hours) at 06/15/12 1335 Last data filed at 06/15/12 0900  Gross per 24 hour  Intake    240 ml  Output      0 ml  Net    240 ml   Filed Weights   06/12/12 1115 06/13/12 2218  Weight: 57.6 kg (126 lb 15.8 oz) 57.69 kg (127  lb 2.9 oz)    Exam: Physical Exam: General: Awake, Oriented to self, No acute distress. HEENT: EOMI. Neck: Supple CV: S1 and S2 Lungs: Clear to ascultation bilaterally Abdomen: Soft, Nontender, Nondistended, +bowel sounds. Ext: Good pulses. Trace edema.  Data Reviewed: Basic Metabolic Panel:  Lab 06/15/12 8119 06/13/12 0605 06/12/12 1310 06/12/12 0206  NA 137 136 132* 138  K 3.5 3.0* 3.4* 3.9  CL 103 98 97 100  CO2 23 26 24  --  GLUCOSE 106* 109* 94 124*  BUN 7 8 8 10   CREATININE 0.64 0.75 0.72 1.10  CALCIUM 9.5 9.2 9.4 --  MG 1.6 -- -- --  PHOS -- -- -- --   Liver Function Tests:  Lab 06/12/12 1310  AST 58*  ALT 35  ALKPHOS 187*  BILITOT 1.0  PROT 7.8  ALBUMIN 2.1*    Lab 06/12/12 1310  LIPASE 16  AMYLASE --   No results found for this basename: AMMONIA:5 in the last 168 hours CBC:  Lab 06/15/12 0650 06/13/12 0605 06/12/12 1310 06/12/12 0206 06/12/12 0136  WBC 7.5 9.4 8.2 -- 6.8  NEUTROABS -- -- 6.1 -- 5.2  HGB 10.0* 10.5* 11.9* 12.2 11.3*  HCT 31.0* 32.8* 36.3 36.0 34.5*  MCV 88.3 87.2 88.5 -- 88.5  PLT 254 247 PLATELET CLUMPS NOTED ON SMEAR, COUNT APPEARS ADEQUATE -- 207   Cardiac Enzymes: No results found for this basename: CKTOTAL:5,CKMB:5,CKMBINDEX:5,TROPONINI:5 in the last 168 hours BNP (last 3 results) No results  found for this basename: PROBNP:3 in the last 8760 hours CBG: No results found for this basename: GLUCAP:5 in the last 168 hours  Recent Results (from the past 240 hour(s))  CULTURE, BLOOD (ROUTINE X 2)     Status: Normal (Preliminary result)   Collection Time   06/12/12  1:30 AM      Component Value Range Status Comment   Specimen Description BLOOD RIGHT ARM   Final    Special Requests BOTTLES DRAWN AEROBIC ONLY 5CC   Final    Culture  Setup Time 06/12/2012 08:36   Final    Culture     Final    Value:        BLOOD CULTURE RECEIVED NO GROWTH TO DATE CULTURE WILL BE HELD FOR 5 DAYS BEFORE ISSUING [name] FINAL NEGATIVE REPORT   Report  Status PENDING   Incomplete   CULTURE, BLOOD (ROUTINE X 2)     Status: Normal (Preliminary result)   Collection Time   06/12/12  1:45 AM      Component Value Range Status Comment   Specimen Description BLOOD LEFT ARM   Final    Special Requests BOTTLES DRAWN AEROBIC ONLY 6CC   Final    Culture  Setup Time 06/12/2012 08:36   Final    Culture     Final    Value:        BLOOD CULTURE RECEIVED NO GROWTH TO DATE CULTURE WILL BE HELD FOR 5 DAYS BEFORE ISSUING [name] FINAL NEGATIVE REPORT   Report Status PENDING   Incomplete   CLOSTRIDIUM DIFFICILE BY PCR     Status: Normal   Collection Time   06/12/12  4:40 PM      Component Value Range Status Comment   C difficile by pcr NEGATIVE  NEGATIVE Final      Studies: No results found.  Scheduled Meds:    . ciprofloxacin  250 mg Oral BID  . donepezil  10 mg Oral QHS  . Memantine HCl ER  1 capsule Oral Daily  . potassium chloride  40 mEq Oral Daily   Continuous Infusions:    Principal Problem:  *Encephalopathy acute Active Problems:  Fever  UTI (lower urinary tract infection)  History of colon cancer  Dementia   Kathleen [name]  [facility] Pager [phone]. If 7PM-7AM, please contact night-coverage at [email or web address], password [facility] 06/15/2012, 1:35 PM  LOS: 3 days

## 2012-06-15 NOTE — Discharge Summary (Signed)
Physician Discharge Summary  Kathleen [name] ZOX:[id number] DOB: 1926/01/12 DOA: 06/12/2012  PCP: Pearson Grippe, MD  Admit date: 06/12/2012 Discharge date: 06/15/2012  Time spent: 35 minutes  Recommendations for Outpatient Follow-up:  Followup with Pearson Grippe, MD (PCP) in 1 week.  Home health PT/OT/RN/Aide arranged at discharge.  Discharge Diagnoses:  Principal Problem:  *Encephalopathy acute Active Problems:  Fever  UTI (lower urinary tract infection)  History of colon cancer  Dementia   Discharge Condition: Stable  Diet recommendation: General diet  Filed Weights   06/12/12 1115 06/13/12 2218  Weight: 57.6 kg (126 lb 15.8 oz) 57.69 kg (127 lb 2.9 oz)    History of present illness:  On admission: "79 year-old female with history of advanced dementia, previous history of colon cancer status post surgery was brought to the ER by patient's caregiver after patient was found to be increasingly confused complaining of abdominal pain and right leg pain and was found to be febrile. In the ER patient was found to be having a fever of 103F and UA was compatible UTI. Chest x-ray was only showing features of bronchitis. Patient did not have any shortness of breath cough or chest pain. As per the caregiver patient's mental status is at the baseline. Patient at this time does not complain of any abdominal pain. Not sure the exact location of the abdomen pain. Patient is able to move extremities without any pain at this time. Patient has been admitted for further management."  Hospital Course:  Acute encephalopathy probably secondary to fever  As per patient's daughters mental status is back to baseline. Head CT on 06/12/2012 showed no acute intracranial abnormality.   Generalized weakness  Requested PT consultation as patient has some weakness, likely due to deconditioning. PT has not had a chance to evaluate the patient. Request home health PT at the time of discharge.   Fever  Improved.  Probably from UTI. Urine cultures unfortunately not sent. Patient was on empiric ceftriaxone which will be transitioned to ciprofloxacin. Influenza PCR negative, BC x 2 negative to date. Continue ciprofloxacin till 06/17/2012 to complete 7 day course.  Advanced dementia  Continue present medications once fever has decreased. Restart home medications.   History of colon cancer  Stable.   Hypokalemia  Resolved with replacement.   Diarrhea  C. Diff PCR negative.   Hemorrhoidal pain  Per family has seen Dr. Loreta Ave in the past. Sitz bath and Tucks as needed.   Consultants:  None Procedures:  None Antibiotics:  Ceftriaxone 06/12/2012 >> 06/14/2012  Ciprofloxacin 06/14/2012 >> Till 06/17/2012 to complete 7 day course.  Discharge Exam: Filed Vitals:   06/14/12 0606 06/14/12 1742 06/14/12 2224 06/15/12 0525  BP: 107/52  142/61 146/54  Pulse: 70  80 80  Temp: 99.9 F (37.7 C) 98 F (36.7 C) 100.2 F (37.9 C) 99.7 F (37.6 C)  TempSrc: Oral Oral Oral   Resp: 16  17 17   Height:      Weight:      SpO2: 98%  98% 95%   Discharge Instructions  Discharge Orders    Future Orders Please Complete By Expires   Diet general      Increase activity slowly      Discharge instructions      Comments:   Followup with Pearson Grippe, MD (PCP) in 1 week.       Medication List     As of 06/15/2012  1:42 PM    TAKE these medications  acetaminophen 500 MG tablet   Commonly known as: TYLENOL   Take 1,000 mg by mouth every 6 (six) hours as needed. For pain/fever      ciprofloxacin 250 MG tablet   Commonly known as: CIPRO   Take 1 tablet (250 mg total) by mouth 2 (two) times daily.      donepezil 10 MG tablet   Commonly known as: ARICEPT   Take 10 mg by mouth at bedtime.      NAMENDA XR 28 MG Cp24   Generic drug: Memantine HCl ER   Take 1 capsule by mouth daily.      witch hazel-glycerin pad   Commonly known as: TUCKS   Apply topically 3 (three) times daily as needed.           The results of significant diagnostics from this hospitalization (including imaging, microbiology, ancillary and laboratory) are listed below for reference.    Significant Diagnostic Studies: Ct Abdomen Pelvis Wo Contrast  06/12/2012  *RADIOLOGY REPORT*  Clinical Data: Abdominal pain, fever, UTI, advanced dementia  CT ABDOMEN AND PELVIS WITHOUT CONTRAST  Technique:  Multidetector CT imaging of the abdomen and pelvis was performed following the standard protocol without intravenous contrast.  Comparison: 02/15/2004  Findings: Trace bilateral pleural effusions.  Trace pericardial effusion, unchanged.  Unenhanced liver, spleen, and adrenal glands are unremarkable.  Fluid density lesion along the anterior aspect of the pancreatic head/body, measuring 1.2 x 1.4 cm, of uncertain etiology. Differential considerations include a pancreatic pseudocyst (in the setting of recent prior pancreatitis) or a pancreatic cystic lesion.  Status post cholecystectomy.  Stable prominence of the common bile duct.  Kidneys are unremarkable.  No renal calculi or hydronephrosis.  No evidence of bowel obstruction. Prior distal colonic resection. Mild thickening of the rectum (series 2/image 55), similar to 2005.  Associated presacral soft tissue thickening/fluid (series 2/image 55), grossly unchanged, suggesting radiation changes.  Atherosclerotic calcifications of the abdominal aorta and branch vessels.  2.0 x 2.6 cm fluid density lesion in the left para-aortic space (series 2/image 35), mildly increased when compared to 2005 (previously 1.8 x 2.0 cm), likely benign.  No suspicious abdominopelvic lymphadenopathy.  Status post hysterectomy.  No adnexal masses.  Bladder is within normal limits.  Degenerative changes of the visualized thoracolumbar spine.  Mild superior endplate changes at L2.  IMPRESSION: No evidence of bowel obstruction.  Prior distal colonic resection.  Stable mild thickening of the rectum with associated  presacral soft tissue/stranding, likely reflecting radiation changes.  1.4 cm cystic lesion along the anterior pancreatic head/body. Primary differential considerations include a pancreatic pseudocyst (in the setting of recent prior pancreatitis) or a pancreatic cystic lesion such as an IPMN.  If further characterization is clinically warranted, pancreatic protocol CT abdomen with/without contrast could be performed (MRI would likely be motion degraded).   Original Report Authenticated By: Charline Bills, M.D.    Dg Chest 2 View  06/12/2012  *RADIOLOGY REPORT*  Clinical Data: Fever.  Lethargy.  Lower abdominal pain.  CHEST - 2 VIEW  Comparison: 01/08/2012  Findings: Borderline heart size with normal pulmonary vascularity. Peribronchial thickening and central interstitial changes suggest chronic bronchitis.  No focal airspace consolidation.  No blunting of costophrenic angles.  No pneumothorax.  Mediastinal contours appear intact.  Tortuous aorta.  Degenerative changes in the spine. No significant change since previous study.  IMPRESSION: Chronic bronchitic changes.  No evidence of active consolidation.   Original Report Authenticated By: Burman Nieves, M.D.    Dg Pelvis 1-2  Views  06/12/2012  *RADIOLOGY REPORT*  Clinical Data: Pelvic pain, status post fall  PELVIS - 1-2 VIEW  Comparison: None.  Findings: Pelvis is rotated.  No fracture is seen.  Mild degenerative changes of the bilateral hips.  Iliac bones/sacrum are obscured by overlying oral contrast.  Surgical clips overlying the pelvis.  IMPRESSION: No fracture is seen.   Original Report Authenticated By: Charline Bills, M.D.    Ct Head Wo Contrast  06/12/2012  *RADIOLOGY REPORT*  Clinical Data: Fever, confusion, lethargy  CT HEAD WITHOUT CONTRAST  Technique:  Contiguous axial images were obtained from the base of the skull through the vertex without contrast.  Comparison: None.  Findings: No evidence of parenchymal hemorrhage or extra-axial fluid  collection. No mass lesion, mass effect, or midline shift.  No CT evidence of acute infarction.  Mild age related atrophy with secondary ventricular prominence.  Mild partial opacification of the left sphenoid sinus.  The visualized paranasal sinuses and mastoid air cells are otherwise clear.  No evidence of calvarial fracture.  IMPRESSION: No evidence of acute intracranial abnormality.   Original Report Authenticated By: Charline Bills, M.D.     Microbiology: Recent Results (from the past 240 hour(s))  CULTURE, BLOOD (ROUTINE X 2)     Status: Normal (Preliminary result)   Collection Time   06/12/12  1:30 AM      Component Value Range Status Comment   Specimen Description BLOOD RIGHT ARM   Final    Special Requests BOTTLES DRAWN AEROBIC ONLY 5CC   Final    Culture  Setup Time 06/12/2012 08:36   Final    Culture     Final    Value:        BLOOD CULTURE RECEIVED NO GROWTH TO DATE CULTURE WILL BE HELD FOR 5 DAYS BEFORE ISSUING A FINAL NEGATIVE REPORT   Report Status PENDING   Incomplete   CULTURE, BLOOD (ROUTINE X 2)     Status: Normal (Preliminary result)   Collection Time   06/12/12  1:45 AM      Component Value Range Status Comment   Specimen Description BLOOD LEFT ARM   Final    Special Requests BOTTLES DRAWN AEROBIC ONLY 6CC   Final    Culture  Setup Time 06/12/2012 08:36   Final    Culture     Final    Value:        BLOOD CULTURE RECEIVED NO GROWTH TO DATE CULTURE WILL BE HELD FOR 5 DAYS BEFORE ISSUING A FINAL NEGATIVE REPORT   Report Status PENDING   Incomplete   CLOSTRIDIUM DIFFICILE BY PCR     Status: Normal   Collection Time   06/12/12  4:40 PM      Component Value Range Status Comment   C difficile by pcr NEGATIVE  NEGATIVE Final      Labs: Basic Metabolic Panel:  Lab 06/15/12 4540 06/13/12 0605 06/12/12 1310 06/12/12 0206  NA 137 136 132* 138  K 3.5 3.0* 3.4* 3.9  CL 103 98 97 100  CO2 23 26 24  --  GLUCOSE 106* 109* 94 124*  BUN 7 8 8 10   CREATININE 0.64 0.75 0.72  1.10  CALCIUM 9.5 9.2 9.4 --  MG 1.6 -- -- --  PHOS -- -- -- --   Liver Function Tests:  Lab 06/12/12 1310  AST 58*  ALT 35  ALKPHOS 187*  BILITOT 1.0  PROT 7.8  ALBUMIN 2.1*    Lab 06/12/12 1310  LIPASE 16  AMYLASE --   No results found for this basename: AMMONIA:5 in the last 168 hours CBC:  Lab 06/15/12 0650 06/13/12 0605 06/12/12 1310 06/12/12 0206 06/12/12 0136  WBC 7.5 9.4 8.2 -- 6.8  NEUTROABS -- -- 6.1 -- 5.2  HGB 10.0* 10.5* 11.9* 12.2 11.3*  HCT 31.0* 32.8* 36.3 36.0 34.5*  MCV 88.3 87.2 88.5 -- 88.5  PLT 254 247 PLATELET CLUMPS NOTED ON SMEAR, COUNT APPEARS ADEQUATE -- 207   Cardiac Enzymes: No results found for this basename: CKTOTAL:5,CKMB:5,CKMBINDEX:5,TROPONINI:5 in the last 168 hours BNP: BNP (last 3 results) No results found for this basename: PROBNP:3 in the last 8760 hours CBG: No results found for this basename: GLUCAP:5 in the last 168 hours     Signed:  Alem Fahl A  [facility] 06/15/2012, 1:42 PM

## 2012-06-17 ENCOUNTER — Encounter (HOSPITAL_COMMUNITY): Payer: Self-pay

## 2012-06-17 ENCOUNTER — Emergency Department (INDEPENDENT_AMBULATORY_CARE_PROVIDER_SITE_OTHER)
Admission: EM | Admit: 2012-06-17 | Discharge: 2012-06-17 | Disposition: A | Discharge status: Home / Self Care | Payer: Medicare Other | Source: Home / Self Care | Attending: Family Medicine | Admitting: Family Medicine

## 2012-06-17 DIAGNOSIS — H109 Unspecified conjunctivitis: Secondary | ICD-10-CM

## 2012-06-17 MED ORDER — TOBRAMYCIN 0.3 % OP SOLN: 1.0000 [drp] | Freq: Four times a day (QID) | OPHTHALMIC | Status: DC

## 2012-06-17 NOTE — ED Notes (Signed)
Eye redness; seen by MD only

## 2012-06-17 NOTE — ED Provider Notes (Addendum)
History     CSN: [id number]  Arrival date & time 06/17/12  1844   First MD Initiated Contact with Patient 06/17/12 1850      No chief complaint on file.   (Consider location/radiation/quality/duration/timing/severity/associated sxs/prior treatment) Patient is a 79 y.o. female presenting with eye problem. The history is provided by a caregiver.  Eye Problem  This is a new problem. The current episode started yesterday. The left eye is affected.There was no injury mechanism. The patient is experiencing no pain. There is no history of trauma to the eye. There is no known exposure to pink eye.    Past Medical History  Diagnosis Date  . Cancer colon   . Alzheimer's dementia     Past Surgical History  Procedure Date  . Colon surgery   . Appendectomy   . Cholecystectomy   . Tonsillectomy     No family history on file.  History  Substance Use Topics  . Smoking status: Never Smoker   . Smokeless tobacco: Never Used  . Alcohol Use: No    OB History    Grav Para Term Preterm Abortions TAB SAB Ect Mult Living                  Review of Systems  Constitutional: Negative.   Eyes: Negative.     Allergies  Review of patient's allergies indicates no known allergies.  Home Medications   Current Outpatient Rx  Name  Route  Sig  Dispense  Refill  . ACETAMINOPHEN 500 MG PO TABS   Oral   Take 1,000 mg by mouth every 6 (six) hours as needed. For pain/fever         . CIPROFLOXACIN HCL 250 MG PO TABS   Oral   Take 1 tablet (250 mg total) by mouth 2 (two) times daily.   5 tablet   0   . DONEPEZIL HCL 10 MG PO TABS   Oral   Take 10 mg by mouth at bedtime.         Marland Kitchen MEMANTINE HCL ER 28 MG PO CP24   Oral   Take 1 capsule by mouth daily.         . TOBRAMYCIN SULFATE 0.3 % OP SOLN   Left Eye   Place 1 drop into the left eye every 6 (six) hours. After warm cloth to eye for 5 minutes   5 mL   0   . WITCH HAZEL-GLYCERIN EX PADS   Topical   Apply topically 3  (three) times daily as needed.   40 each   0     BP 143/61  Pulse 76  Temp 98.2 F (36.8 C) (Oral)  Resp 20  Ht 5\' 6"  (1.676 m)  Wt 129 lb (58.514 kg)  BMI 20.82 kg/m2  SpO2 97%  Physical Exam  Nursing note and vitals reviewed. Constitutional: She appears well-developed and well-nourished.  HENT:  Head: Normocephalic.  Eyes: Conjunctivae normal and EOM are normal. Pupils are equal, round, and reactive to light.  Neck: Normal range of motion. Neck supple.  Neurological: She is alert.       Alzheimer's dementia.  Skin: Skin is warm and dry.  Psychiatric: She has a normal mood and affect.    ED Course  Procedures (including critical care time)  Labs Reviewed - No data to display No results found.   1. Conjunctivitis of left eye       MDM  Linna Hoff, MD 06/17/12 4782  Linna Hoff, MD 06/19/12 [phone]

## 2012-06-18 LAB — CULTURE, BLOOD (ROUTINE X 2): Culture: NO GROWTH

## 2013-06-12 ENCOUNTER — Emergency Department (HOSPITAL_COMMUNITY): Payer: Medicare Other

## 2013-06-12 ENCOUNTER — Encounter (HOSPITAL_COMMUNITY): Payer: Self-pay | Admitting: Emergency Medicine

## 2013-06-12 ENCOUNTER — Inpatient Hospital Stay (HOSPITAL_COMMUNITY)
Admission: EM | Admit: 2013-06-12 | Discharge: 2013-06-13 | DRG: 071 | Disposition: A | Discharge status: Home / Self Care | Payer: Medicare Other | Attending: Internal Medicine | Admitting: Internal Medicine

## 2013-06-12 DIAGNOSIS — G309 Alzheimer's disease, unspecified: Secondary | ICD-10-CM | POA: Diagnosis present

## 2013-06-12 DIAGNOSIS — T68XXXA Hypothermia, initial encounter: Secondary | ICD-10-CM | POA: Diagnosis present

## 2013-06-12 DIAGNOSIS — G9340 Encephalopathy, unspecified: Secondary | ICD-10-CM | POA: Diagnosis present

## 2013-06-12 DIAGNOSIS — I4891 Unspecified atrial fibrillation: Secondary | ICD-10-CM

## 2013-06-12 DIAGNOSIS — X31XXXA Exposure to excessive natural cold, initial encounter: Secondary | ICD-10-CM | POA: Diagnosis present

## 2013-06-12 DIAGNOSIS — F0281 Dementia in other diseases classified elsewhere with behavioral disturbance: Secondary | ICD-10-CM | POA: Diagnosis present

## 2013-06-12 DIAGNOSIS — E860 Dehydration: Secondary | ICD-10-CM | POA: Diagnosis present

## 2013-06-12 DIAGNOSIS — G9341 Metabolic encephalopathy: Secondary | ICD-10-CM | POA: Diagnosis present

## 2013-06-12 DIAGNOSIS — F02818 Dementia in other diseases classified elsewhere, unspecified severity, with other behavioral disturbance: Secondary | ICD-10-CM | POA: Diagnosis present

## 2013-06-12 DIAGNOSIS — F0280 Dementia in other diseases classified elsewhere without behavioral disturbance: Secondary | ICD-10-CM | POA: Diagnosis present

## 2013-06-12 DIAGNOSIS — Z9181 History of falling: Secondary | ICD-10-CM

## 2013-06-12 DIAGNOSIS — Z85038 Personal history of other malignant neoplasm of large intestine: Secondary | ICD-10-CM

## 2013-06-12 HISTORY — DX: Unspecified atrial fibrillation: I48.91

## 2013-06-12 LAB — RPR: RPR Ser Ql: NONREACTIVE

## 2013-06-12 LAB — URINALYSIS, ROUTINE W REFLEX MICROSCOPIC
BILIRUBIN URINE: NEGATIVE
Glucose, UA: NEGATIVE mg/dL
KETONES UR: NEGATIVE mg/dL
Leukocytes, UA: NEGATIVE
Nitrite: NEGATIVE
PH: 6 (ref 5.0–8.0)
Protein, ur: NEGATIVE mg/dL
SPECIFIC GRAVITY, URINE: 1.009 (ref 1.005–1.030)
Urobilinogen, UA: 0.2 mg/dL (ref 0.0–1.0)

## 2013-06-12 LAB — COMPREHENSIVE METABOLIC PANEL
ALK PHOS: 68 U/L (ref 39–117)
ALT: 22 U/L (ref 0–35)
AST: 41 U/L — AB (ref 0–37)
Albumin: 3.1 g/dL — ABNORMAL LOW (ref 3.5–5.2)
BUN: 10 mg/dL (ref 6–23)
CALCIUM: 10.7 mg/dL — AB (ref 8.4–10.5)
CO2: 28 meq/L (ref 19–32)
Chloride: 101 mEq/L (ref 96–112)
Creatinine, Ser: 0.68 mg/dL (ref 0.50–1.10)
GFR calc Af Amer: 90 mL/min — ABNORMAL LOW (ref >90)
GFR, EST NON AFRICAN AMERICAN: 78 mL/min — AB (ref >90)
Glucose, Bld: 103 mg/dL — ABNORMAL HIGH (ref 70–99)
POTASSIUM: 4.3 meq/L (ref 3.7–5.3)
SODIUM: 141 meq/L (ref 137–147)
Total Bilirubin: 0.4 mg/dL (ref 0.3–1.2)
Total Protein: 9.1 g/dL — ABNORMAL HIGH (ref 6.0–8.3)

## 2013-06-12 LAB — CBC
HCT: 42.2 % (ref 36.0–46.0)
Hemoglobin: 13.6 g/dL (ref 12.0–15.0)
MCH: 30.4 pg (ref 26.0–34.0)
MCHC: 32.2 g/dL (ref 30.0–36.0)
MCV: 94.4 fL (ref 78.0–100.0)
PLATELETS: 236 10*3/uL (ref 150–400)
RBC: 4.47 MIL/uL (ref 3.87–5.11)
RDW: 14.8 % (ref 11.5–15.5)
WBC: 6.4 10*3/uL (ref 4.0–10.5)

## 2013-06-12 LAB — MRSA PCR SCREENING: MRSA BY PCR: NEGATIVE

## 2013-06-12 LAB — POCT I-STAT, CHEM 8
BUN: 9 mg/dL (ref 6–23)
CHLORIDE: 102 meq/L (ref 96–112)
CREATININE: 0.8 mg/dL (ref 0.50–1.10)
Calcium, Ion: 1.29 mmol/L (ref 1.13–1.30)
GLUCOSE: 108 mg/dL — AB (ref 70–99)
HCT: 47 % — ABNORMAL HIGH (ref 36.0–46.0)
Hemoglobin: 16 g/dL — ABNORMAL HIGH (ref 12.0–15.0)
POTASSIUM: 4.2 meq/L (ref 3.7–5.3)
Sodium: 142 mEq/L (ref 137–147)
TCO2: 30 mmol/L (ref 0–100)

## 2013-06-12 LAB — T4, FREE: Free T4: 1.16 ng/dL (ref 0.80–1.80)

## 2013-06-12 LAB — TSH: TSH: 1.274 u[IU]/mL (ref 0.350–4.500)

## 2013-06-12 LAB — VITAMIN B12: Vitamin B-12: 383 pg/mL (ref 211–911)

## 2013-06-12 LAB — URINE MICROSCOPIC-ADD ON

## 2013-06-12 MED ORDER — ONDANSETRON HCL 4 MG/2ML IJ SOLN: 4.0000 mg | Freq: Four times a day (QID) | INTRAMUSCULAR | Status: DC | PRN

## 2013-06-12 MED ORDER — HALOPERIDOL LACTATE 5 MG/ML IJ SOLN
0.5000 mg | Freq: Two times a day (BID) | INTRAMUSCULAR | Status: DC | PRN
  Administered 2013-06-12: 0.5 mg via INTRAVENOUS
  Filled 2013-06-12: qty 1

## 2013-06-12 MED ORDER — DONEPEZIL HCL 10 MG PO TABS
10.0000 mg | ORAL_TABLET | Freq: Every day | ORAL | Status: DC
  Administered 2013-06-12: 10 mg via ORAL
  Filled 2013-06-12 (×2): qty 1

## 2013-06-12 MED ORDER — SODIUM CHLORIDE 0.9 % IV SOLN
INTRAVENOUS | Status: DC
  Administered 2013-06-12: 07:00:00 via INTRAVENOUS

## 2013-06-12 MED ORDER — DILTIAZEM HCL ER COATED BEADS 120 MG PO CP24
120.0000 mg | ORAL_CAPSULE | Freq: Every day | ORAL | Status: DC
  Administered 2013-06-12 – 2013-06-13 (×2): 120 mg via ORAL
  Filled 2013-06-12 (×2): qty 1

## 2013-06-12 MED ORDER — MEMANTINE HCL 10 MG PO TABS
10.0000 mg | ORAL_TABLET | Freq: Two times a day (BID) | ORAL | Status: DC
  Filled 2013-06-12 (×2): qty 1

## 2013-06-12 MED ORDER — INFLUENZA VAC SPLIT QUAD 0.5 ML IM SUSP
0.5000 mL | INTRAMUSCULAR | Status: DC
  Filled 2013-06-12: qty 0.5

## 2013-06-12 MED ORDER — PNEUMOCOCCAL VAC POLYVALENT 25 MCG/0.5ML IJ INJ
0.5000 mL | INJECTION | INTRAMUSCULAR | Status: DC
Start: 2013-06-13 — End: 2013-06-13
  Filled 2013-06-12: qty 0.5

## 2013-06-12 MED ORDER — MEMANTINE HCL 10 MG PO TABS
10.0000 mg | ORAL_TABLET | Freq: Two times a day (BID) | ORAL | Status: DC
  Administered 2013-06-13: 10 mg via ORAL
  Filled 2013-06-12 (×3): qty 1

## 2013-06-12 MED ORDER — ACETAMINOPHEN 650 MG RE SUPP: 650.0000 mg | Freq: Four times a day (QID) | RECTAL | Status: DC | PRN

## 2013-06-12 MED ORDER — SODIUM CHLORIDE 0.9 % IV SOLN
INTRAVENOUS | Status: AC
  Administered 2013-06-12: 19:00:00 via INTRAVENOUS

## 2013-06-12 MED ORDER — MIRTAZAPINE 15 MG PO TABS
15.0000 mg | ORAL_TABLET | Freq: Every day | ORAL | Status: DC
  Administered 2013-06-12: 15 mg via ORAL
  Filled 2013-06-12 (×2): qty 1

## 2013-06-12 MED ORDER — ACETAMINOPHEN 325 MG PO TABS: 650.0000 mg | ORAL_TABLET | Freq: Four times a day (QID) | ORAL | Status: DC | PRN

## 2013-06-12 MED ORDER — HEPARIN SODIUM (PORCINE) 5000 UNIT/ML IJ SOLN
5000.0000 [IU] | Freq: Three times a day (TID) | INTRAMUSCULAR | Status: DC
  Administered 2013-06-12 – 2013-06-13 (×3): 5000 [IU] via SUBCUTANEOUS
  Filled 2013-06-12 (×6): qty 1

## 2013-06-12 MED ORDER — ONDANSETRON HCL 4 MG PO TABS: 4.0000 mg | ORAL_TABLET | Freq: Four times a day (QID) | ORAL | Status: DC | PRN

## 2013-06-12 MED ORDER — WITCH HAZEL-GLYCERIN EX PADS
1.0000 "application " | MEDICATED_PAD | Freq: Three times a day (TID) | CUTANEOUS | Status: DC | PRN
  Administered 2013-06-12: 1 via TOPICAL
  Filled 2013-06-12: qty 100

## 2013-06-12 MED ORDER — ASPIRIN EC 81 MG PO TBEC
81.0000 mg | DELAYED_RELEASE_TABLET | Freq: Every day | ORAL | Status: DC | PRN
  Filled 2013-06-12: qty 1

## 2013-06-12 MED ORDER — MEMANTINE HCL ER 28 MG PO CP24
1.0000 | ORAL_CAPSULE | Freq: Every day | ORAL | Status: DC
  Filled 2013-06-12 (×2): qty 28

## 2013-06-12 MED ORDER — QUETIAPINE 12.5 MG HALF TABLET
37.5000 mg | ORAL_TABLET | Freq: Every day | ORAL | Status: DC
  Administered 2013-06-12: 37.5 mg via ORAL
  Filled 2013-06-12 (×2): qty 1

## 2013-06-12 NOTE — ED Notes (Signed)
Attempted report.  Will await return phone call.

## 2013-06-12 NOTE — H&P (Signed)
[facility] History and Physical  [name] [id number] DOB: 15-May-1926 DOA: 06/12/2013  Referring physician: Dr. Marnette Burgess PCP: Jani Gravel, MD   Chief Complaint: hypothermic, confused  HPI: Kathleen [name] is a 80 y.o. female with pmh significant for dementia (with behavioral disturbances), atrial fibrillation and hx of colon cancer. Was found outside in cold weather very confused. EMS called and brought patient to ED. In ED other than hypothermia and confusion no other complaints or abnormalities where found. [facility] called to admit patient for further evaluation/treatment. Temp on arrival 92.5; no signs of infection or septic appearance  EMS able to get in touch with patient's daughter, who reported that patient has a history of advance dementia and has a caregiver in the daytime but not overnight. Patient does not recall what happened. She denies any pain or injury  Review of Systems:  Negative except as mentioned on HPI. Patient with dementia making difficult any further review.  Past Medical History  Diagnosis Date  . Cancer colon   . Alzheimer's dementia   . A-fib    Past Surgical History  Procedure Laterality Date  . Colon surgery    . Appendectomy    . Cholecystectomy    . Tonsillectomy     Social History:  reports that she has never smoked. She has never used smokeless tobacco. She reports that she does not drink alcohol or use illicit drugs.  No Known Allergies  Family history: unable to be reviewed due to dementia.  Prior to Admission medications   Medication Sig Start Date End Date Taking? Authorizing Provider  ALPRAZolam Duanne Moron) 1 MG tablet Take 1 mg by mouth 2 (two) times daily.   Yes Historical Provider, MD  aspirin EC 81 MG tablet Take 81 mg by mouth daily as needed for mild pain (hemorrhoids).   Yes Historical Provider, MD  donepezil (ARICEPT) 10 MG tablet Take 10 mg by mouth at bedtime.   Yes Historical Provider, MD  Memantine HCl ER (NAMENDA XR) 28 MG CP24  Take 1 capsule by mouth daily.   Yes Historical Provider, MD  mirtazapine (REMERON) 15 MG tablet Take 15 mg by mouth at bedtime.   Yes Historical Provider, MD  Vitamin D, Ergocalciferol, (DRISDOL) 50000 UNITS CAPS capsule Take 50,000 Units by mouth every 7 (seven) days.   Yes Historical Provider, MD  witch hazel-glycerin (TUCKS) pad Apply 1 application topically 3 (three) times daily as needed for hemorrhoids.   Yes Historical Provider, MD   Physical Exam: Filed Vitals:   06/12/13 0630  BP: 146/83  Pulse: 125  Temp:   Resp: 19    BP 146/83  Pulse 125  Temp(Src) 93.9 F (34.4 C) (Rectal)  Resp 19  SpO2 99%  General:  Appears comfortable, confused. Had hx of dementia, unknown baseline. Currently oriented to person only. Eyes: PERRL, normal lids, irises & conjunctiva ENT: grossly normal hearing,mild dryness of MM, no erythema or exudates Neck: no LAD, masses or thyromegaly Cardiovascular: RRR, no m/r/g. No LE edema. EKG: with atrial fibrillation. Respiratory: CTA bilaterally, no w/r/r. Normal respiratory effort. Abdomen: soft, nt, nd, positive BS Skin: no rash or induration seen on exam Musculoskeletal: grossly normal tone BUE/BLE Neurologic: grossly non-focal. Patient was able to follow simple commands, move all 4 limbs spontaneously. CN intact          Labs on Admission:  Basic Metabolic Panel:  Recent Labs Lab 06/12/13 0630 06/12/13 0647  NA 141 142  K 4.3 4.2  CL 101 102  CO2 28  --   GLUCOSE 103* 108*  BUN 10 9  CREATININE 0.68 0.80  CALCIUM 10.7*  --    Liver Function Tests:  Recent Labs Lab 06/12/13 0630  AST 41*  ALT 22  ALKPHOS 68  BILITOT 0.4  PROT 9.1*  ALBUMIN 3.1*   CBC:  Recent Labs Lab 06/12/13 0630 06/12/13 0647  WBC 6.4  --   HGB 13.6 16.0*  HCT 42.2 47.0*  MCV 94.4  --   PLT 236  --    Radiological Exams on Admission: Dg Chest 1 View  06/12/2013   CLINICAL DATA:  80 year old female with altered mental status. Shortness of  Breath. Initial encounter.  EXAM: CHEST - 1 VIEW  COMPARISON:  CT Abdomen and Pelvis 06/12/2012 and earlier.  FINDINGS: Portable AP semi upright view at 0703 hrs. Small pleural effusions no longer evident. Stable cardiomegaly and mediastinal contours. Visualized tracheal air column is within normal limits. No pneumothorax or pulmonary edema. Increased interstitial markings appear stable. No acute pulmonary opacity.  IMPRESSION: No acute cardiopulmonary abnormality.   Electronically Signed   By: Lars Pinks M.D.   On: 06/12/2013 08:35   Ct Head Wo Contrast  06/12/2013   CLINICAL DATA:  80 year old female altered mental status. Initial encounter.  EXAM: CT HEAD WITHOUT CONTRAST  TECHNIQUE: Contiguous axial images were obtained from the base of the skull through the vertex without intravenous contrast.  COMPARISON:  06/12/2012.  FINDINGS: Stable Mild sphenoid sinus mucosal thickening. Other Visualized paranasal sinuses and mastoids are clear. No acute osseous abnormality identified. No acute orbit or scalp soft tissue findings; small suboccipital probable sebaceous cyst on the right was partially visible before.  Small dural calcifications re- identified. Stable cerebral volume. Stable ventricle size and configuration. No midline shift, mass effect, or evidence of intracranial mass lesion. No evidence of cortically based acute infarction identified. No acute intracranial hemorrhage identified. No suspicious intracranial vascular hyperdensity. Gray-white matter differentiation is within normal limits throughout the brain.  IMPRESSION: No acute intracranial abnormality.  Stable non contrast CT appearance of the brain, with ventricular prominence favored to be ex vacuo in nature.   Electronically Signed   By: Lars Pinks M.D.   On: 06/12/2013 07:17    EKG:  Ventricular Rate: 112  PR Interval:  QRS Duration: 70  QT Interval: 383  QTC Calculation: 523  R Axis: 41  Text Interpretation: Atrial fibrillation Borderline  low voltage   Assessment/Plan 1-Acute encephalopathy: appears to be secondary to environmental exposure on top of underlying dementia.  -will provide supportive care and IVF's -warming blankets -to be thorough will check TSH, RPR and B12 -follow clinical response -will hold PRN xanax for now in case patient benzo's are contributing to confusion. -will check urine culture. -CT head negative and no focal neurologic deficit -CXR w/o acute cardiopulmonary process   2-Hypothermia: due to environmental exposure. On arrival to ED temp 92.5 -will continue warming blankets -admitted to step down -provide IVF's and supportive care   3-Atrial fibrillation: not a candidate for chronic anticoagulation. (high risk for falls) -continue ASA and cardizem  4-Dementia of Alzheimer's type with behavioral disturbance: unknown baseline. But definitely not safe for her be home alone. -continue namenda and aricept -continue remeron QHS -provide supportive care -CSW consulted for assistance with placement.  5-Mild dehydration: will provide IVF's -BMET in am.  DVT: heparin.  Code Status: Full Family Communication: no family at bedside Disposition Plan: inpatient, LOS > 2 midnights, stepdown  Time spent:  23 minutes  Kenzlei Runions [facility] Pager [phone]

## 2013-06-12 NOTE — ED Notes (Signed)
Report called to [facility], Therapist, sports.

## 2013-06-12 NOTE — ED Provider Notes (Signed)
CSN: [id number]     Arrival date & time 06/12/13  0548 History   First MD Initiated Contact with Patient 06/12/13 [phone]     Chief Complaint  Patient presents with  . Altered Mental Status   (Consider location/radiation/quality/duration/timing/severity/associated sxs/prior Treatment) HPI History provided by EMS: Found down outside by deliveryman in cold weather patient was sitting outside of her house. She is noted to have a history of dementia and atrial fibrillation - EMS was only able to find a prescription bottle for Cardizem. EMS able to get in touch with patient's daughter reports that she has a history of dementia and has a caregiver in the daytime hours but not overnight. Patient does not recall what happened, is not a reliable historian. She denies any pain or injury. Level V caveat applies Past Medical History  Diagnosis Date  . Cancer colon   . Alzheimer's dementia   . A-fib    Past Surgical History  Procedure Laterality Date  . Colon surgery    . Appendectomy    . Cholecystectomy    . Tonsillectomy     No family history on file. History  Substance Use Topics  . Smoking status: Never Smoker   . Smokeless tobacco: Never Used  . Alcohol Use: No   OB History   Grav Para Term Preterm Abortions TAB SAB Ect Mult Living                 Review of Systems  Unable to perform ROS  level V caveat as above  Allergies  Review of patient's allergies indicates no known allergies.  Home Medications   Current Outpatient Rx  Name  Route  Sig  Dispense  Refill  . acetaminophen (TYLENOL) 500 MG tablet   Oral   Take 1,000 mg by mouth every 6 (six) hours as needed. For pain/fever         . ciprofloxacin (CIPRO) 250 MG tablet   Oral   Take 1 tablet (250 mg total) by mouth 2 (two) times daily.   5 tablet   0   . donepezil (ARICEPT) 10 MG tablet   Oral   Take 10 mg by mouth at bedtime.         . Memantine HCl ER (NAMENDA XR) 28 MG CP24   Oral   Take 1 capsule by mouth  daily.         Marland Kitchen tobramycin (TOBREX) 0.3 % ophthalmic solution   Left Eye   Place 1 drop into the left eye every 6 (six) hours. After warm cloth to eye for 5 minutes   5 mL   0   . witch hazel-glycerin (TUCKS) pad   Topical   Apply topically 3 (three) times daily as needed.   40 each   0    BP 154/128  Pulse 108  Temp(Src) 93.9 F (34.4 C) (Rectal)  Resp 20  SpO2 98% Physical Exam  Constitutional: She appears well-developed and well-nourished.  HENT:  Head: Normocephalic and atraumatic.  Mouth/Throat: Oropharynx is clear and moist.  Eyes: EOM are normal. Pupils are equal, round, and reactive to light.  Neck: Neck supple.  Cardiovascular: Normal rate, regular rhythm and intact distal pulses.   Pulmonary/Chest: Effort normal and breath sounds normal. No respiratory distress. She exhibits no tenderness.  Abdominal: Soft. Bowel sounds are normal. She exhibits no distension. There is no tenderness. There is no rebound.  Musculoskeletal: Normal range of motion. She exhibits no edema.  Neurological:  Awake and  alert oriented to self. Not oriented to day of the week, month, year. No unilateral deficits. No pronator drift. No facial droop. Speech is clear. Equal grips, biceps, triceps. Equal dorsi plantar flexion.  Skin: No rash noted.  Cool to touch    ED Course  Procedures (including critical care time) Labs Review Labs Reviewed  URINALYSIS, ROUTINE W REFLEX MICROSCOPIC - Abnormal; Notable for the following:    Hgb urine dipstick SMALL (*)    All other components within normal limits  POCT I-STAT, CHEM 8 - Abnormal; Notable for the following:    Glucose, Bld 108 (*)    Hemoglobin 16.0 (*)    HCT 47.0 (*)    All other components within normal limits  CBC  URINE MICROSCOPIC-ADD ON  COMPREHENSIVE METABOLIC PANEL   Imaging Review Ct Head Wo Contrast  06/12/2013   CLINICAL DATA:  80 year old female altered mental status. Initial encounter.  EXAM: CT HEAD WITHOUT CONTRAST   TECHNIQUE: Contiguous axial images were obtained from the base of the skull through the vertex without intravenous contrast.  COMPARISON:  06/12/2012.  FINDINGS: Stable Mild sphenoid sinus mucosal thickening. Other Visualized paranasal sinuses and mastoids are clear. No acute osseous abnormality identified. No acute orbit or scalp soft tissue findings; small suboccipital probable sebaceous cyst on the right was partially visible before.  Small dural calcifications re- identified. Stable cerebral volume. Stable ventricle size and configuration. No midline shift, mass effect, or evidence of intracranial mass lesion. No evidence of cortically based acute infarction identified. No acute intracranial hemorrhage identified. No suspicious intracranial vascular hyperdensity. Gray-white matter differentiation is within normal limits throughout the brain.  IMPRESSION: No acute intracranial abnormality.  Stable non contrast CT appearance of the brain, with ventricular prominence favored to be ex vacuo in nature.   Electronically Signed   By: Lars Pinks M.D.   On: 06/12/2013 07:17    EKG Interpretation    Date/Time:  Friday June 12 2013 07:13:07 EST Ventricular Rate:  112 PR Interval:    QRS Duration: 70 QT Interval:  383 QTC Calculation: 523 R Axis:   41 Text Interpretation:  Atrial fibrillation Borderline low voltage, extremity leads Prolonged QT interval Confirmed by Jillayne Witte  MD, Zamzam Whinery ([id number]) on 06/12/2013 7:19:34 AM           Warming blanket warming measures initiated. Foley temp catheter placed. Labs, imaging and urinalysis obtained to further evaluate.   Plan medical admission as patient is not safe to be discharged home.   EMS did bring in all of Cardizem, will give by mouth dose now and continue to monitor heart rate.   Dr. Dyann Kief to admit to trial hospitalist service step down unit MDM  Diagnosis: Altered mental status, history of dementia  Warming measures as above EKG, CT brain, labs,  urinalysis reviewed SDU admit  Teressa Lower, MD 06/12/13 [phone]

## 2013-06-12 NOTE — Evaluation (Addendum)
Physical Therapy Evaluation/ Discharge Patient Details Name: Kathleen [name] MRN: [id number] DOB: 02/07/1926 Today's Date: 06/12/2013 Time: 7672-0947 PT Time Calculation (min): 33 min  PT Assessment / Plan / Recommendation History of Present Illness  Kathleen [name] is a 80 y.o. female with pmh significant for dementia (with behavioral disturbances), atrial fibrillation and hx of colon cancer. Was found outside in cold weather very confused. EMS called and brought patient to ED. In ED other than hypothermia and confusion no other complaints or abnormalities where found. [facility] called to admit patient for further evaluation/treatment. Temp on arrival 92.5; no signs of infection or septic appearance   Clinical Impression  Pt pleasantly confused and not oriented other than to person throughout despite various times of reorienting pt. Pt incontinent of stool on arrival with assist to [facility] [place] and assist for pericare completeness and linen change prior to gait. Pt with unsteady gait and mobility today and is not safe to be home from a mobility or cognition perspective at anytime during the day. Caregiver present throughout stating they feel family will just hire another person for home. Recommend ALF memory care, SNF or 24hrs of home assist for pt safety pending what family prefers. No further therapy needs as pt unable to retain education. Recommend daily ambulation with staff acutely. All education provided to pt and caregiver and dgtr entering room and educated for recommendation as well.     PT Assessment  Patent does not need any further PT services    Follow Up Recommendations  No PT follow up;Supervision/Assistance - 24 hour;Other (comment) (ALF memory care, SNF, vs 24hr assist at home)    Does the patient have the potential to tolerate intense rehabilitation      Barriers to Discharge        Equipment Recommendations  Rolling walker with 5" wheels    Recommendations for Other Services      Frequency      Precautions / Restrictions Precautions Precautions: Fall   Pertinent Vitals/Pain Hemorrhoid pain 8/10 VSS on RA     Mobility  Bed Mobility Overal bed mobility: Needs Assistance Bed Mobility: Supine to Sit Supine to sit: Supervision General bed mobility comments: supervision for safety with lines Transfers Overall transfer level: Needs assistance Transfers: Sit to/from Stand;Stand Pivot Transfers Sit to Stand: Min assist Stand pivot transfers: Min assist General transfer comment: min assist for balance and safety with cueing for hand placement Ambulation/Gait Ambulation/Gait assistance: Min assist Ambulation Distance (Feet): 150 Feet Assistive device: 1 person hand held assist Gait Pattern/deviations: Trunk flexed;Narrow base of support;Shuffle Gait velocity interpretation: <1.8 ft/sec, indicative of risk for recurrent falls General Gait Details: pt with min assist to maintain balance with 2 partial LOB during gait  Stairs: Yes Stairs assistance: Min guard Stair Management: One rail Right;Step to pattern;Forwards Number of Stairs: 3    Exercises     PT Diagnosis:    PT Problem List:   PT Treatment Interventions:       PT Goals(Current goals can be found in the care plan section) Acute Rehab PT Goals PT Goal Formulation: No goals set, d/c therapy  Visit Information  Last PT Received On: 06/12/13 Assistance Needed: +1 History of Present Illness: Kathleen [name] is a 80 y.o. female with pmh significant for dementia (with behavioral disturbances), atrial fibrillation and hx of colon cancer. Was found outside in cold weather very confused. EMS called and brought patient to ED. In ED other than hypothermia and confusion no other complaints or  abnormalities where found. TRH called to admit patient for further evaluation/treatment. Temp on arrival 92.5; no signs of infection or septic appearance        Prior [place] expects to  be discharged to:: Private residence Living Arrangements: Non-relatives/Friends Available Help at Discharge: Available PRN/intermittently Type of Home: House Home Access: Stairs to enter Technical brewer of Steps: 3 Home Layout: One level Home Equipment: None Prior Function Level of Independence: Needs assistance Comments: caregiver from 9am-10pm. supervision for bathing and dressing. Caregivers do all housework and cooking Communication Communication: No difficulties    Cognition  Cognition Arousal/Alertness: Awake/alert Behavior During Therapy: WFL for tasks assessed/performed Overall Cognitive Status: History of cognitive impairments - at baseline    Extremity/Trunk Assessment Upper Extremity Assessment Upper Extremity Assessment: Generalized weakness Lower Extremity Assessment Lower Extremity Assessment: Generalized weakness Cervical / Trunk Assessment Cervical / Trunk Assessment: Normal   Balance    End of Session PT - End of Session Equipment Utilized During Treatment: Gait belt Activity Tolerance: Patient tolerated treatment well Patient left: in chair;with call bell/phone within [name];with family/visitor present Nurse Communication: Mobility status  GP     Kathleen [name] 06/12/2013, 12:45 PM Kathleen [name], [place]

## 2013-06-12 NOTE — ED Notes (Signed)
Pt arrives via EMS after newspaper delivery man found pt sitting outside of her house in the cold. EMS found pt to be very cold to the touch. Hx dementia, afib. Cardizem only medication found by EMS. CBG 90 HR 90 BP 157/96. Pt has caregivers from 10a-10p. Uncertain how long pt has been outside. EMS spoke to daughter who reported pt baseline dementia.   DAUGHTER Veatrice Bourbon [phone]

## 2013-06-12 NOTE — Progress Notes (Signed)
Clinical Social Work Department BRIEF PSYCHOSOCIAL ASSESSMENT 06/12/2013  Patient:  Kathleen [name],Kathleen [name]     Account Number:  [id number]     Admit date:  06/12/2013  Clinical Social Worker:  Freeman Caldron  Date/Time:  06/12/2013 03:48 PM  Referred by:  Physician  Date Referred:  06/12/2013 Referred for  SNF Placement  ALF Placement   Other Referral:   Interview type:  Family Other interview type:   CSW also spoke with [name] caregiver that was in pt's room.    PSYCHOSOCIAL DATA Living Status:  ALONE Admitted from facility:   Level of care:   Primary support name:  Adaisha Campise [phone]) Primary support relationship to patient:  CHILD, ADULT Degree of support available:   Good--pt has caregivers in the home and [name] daughter who provides support.    CURRENT CONCERNS Current Concerns  Post-Acute Placement   Other Concerns:    SOCIAL WORK ASSESSMENT / PLAN CSW read PT note stating "Recommend ALF memory care, SNF or 24hrs of home assist for pt safety pending what family prefers. No further therapy needs as pt unable to retain education." CSW consulted for placement, and CSW went to pt's room to speak with family about placement. Pt's caregiver was in the room and explained that pt's daughter was likely more interested in hiring [name] night-shift caregiver than placing her in [name] facility. Pt's caregiver explains pt was placed in SNF before and was very depressed and lost [name] lot of weight. CSW called pt's daughter to touch base with her about plan for when pt leaves the hospital. CSW recounted conversation with caregiver and that according to the PT note it appears daughter is interested in hiring more help to be with her mother 24/7. Daughter states this is her plan, and when CSW offered resources and information about memory care or assisted living, pt's daughter declined, stating she is going to take care of her mother and hire more help. CSW affirmed decision and thanked daughter for  speaking with CSW.   Assessment/plan status:  No Further Intervention Required Other assessment/ plan:   Information/referral to community resources:   N/[name]--pt's daughter refused resources for memory care/assisted living.    PATIENT'S/FAMILY'S RESPONSE TO PLAN OF CARE: Daughter was brief with CSW on the phone, stating she plans to hire more help for her mother and does not want assistance with placement. CSW respected this decision and thanked daughter for her time.       Ky Barban, MSW, [facility] Clinical Social Worker [phone]

## 2013-06-13 LAB — COMPREHENSIVE METABOLIC PANEL
ALBUMIN: 2.4 g/dL — AB (ref 3.5–5.2)
ALT: 17 U/L (ref 0–35)
AST: 33 U/L (ref 0–37)
Alkaline Phosphatase: 62 U/L (ref 39–117)
BILIRUBIN TOTAL: 0.7 mg/dL (ref 0.3–1.2)
BUN: 9 mg/dL (ref 6–23)
CHLORIDE: 104 meq/L (ref 96–112)
CO2: 27 mEq/L (ref 19–32)
CREATININE: 0.79 mg/dL (ref 0.50–1.10)
Calcium: 9.5 mg/dL (ref 8.4–10.5)
GFR calc Af Amer: 86 mL/min — ABNORMAL LOW (ref >90)
GFR calc non Af Amer: 74 mL/min — ABNORMAL LOW (ref >90)
Glucose, Bld: 96 mg/dL (ref 70–99)
Potassium: 3.5 mEq/L — ABNORMAL LOW (ref 3.7–5.3)
Sodium: 142 mEq/L (ref 137–147)
TOTAL PROTEIN: 7.1 g/dL (ref 6.0–8.3)

## 2013-06-13 LAB — CBC
HEMATOCRIT: 35.5 % — AB (ref 36.0–46.0)
Hemoglobin: 11.5 g/dL — ABNORMAL LOW (ref 12.0–15.0)
MCH: 30.3 pg (ref 26.0–34.0)
MCHC: 32.4 g/dL (ref 30.0–36.0)
MCV: 93.7 fL (ref 78.0–100.0)
Platelets: 199 10*3/uL (ref 150–400)
RBC: 3.79 MIL/uL — ABNORMAL LOW (ref 3.87–5.11)
RDW: 15.1 % (ref 11.5–15.5)
WBC: 7.8 10*3/uL (ref 4.0–10.5)

## 2013-06-13 MED ORDER — DILTIAZEM HCL ER COATED BEADS 120 MG PO CP24: 120.0000 mg | ORAL_CAPSULE | Freq: Every day | ORAL | Status: DC

## 2013-06-13 NOTE — Progress Notes (Signed)
Pt has a foley catheter, but there is no MD order for foley catheter insertion. Foley catheter not indicated; Foley removed.

## 2013-06-13 NOTE — Progress Notes (Signed)
PT Cancellation Note  Patient Details Name: HONESTII MARTON MRN: [id number] DOB: 04-08-1926   Cancelled Treatment:    Reason Eval/Treat Not Completed: Other (comment) (received 2nd eval order 1/16 however eval completed and entered 1/16 without change in pt status or DC plan. Will not perform another evaluation at this time please refer to 1/16 note.)Thanks   Lanetta Inch Beth 06/13/2013, 7:08 AM Elwyn Reach, [place]

## 2013-06-13 NOTE — Discharge Summary (Signed)
Physician Discharge Summary  Kathleen [name] [id number] DOB: Jun 29, 1925 DOA: 06/12/2013  PCP: Jani Gravel, MD  Admit date: 06/12/2013 Discharge date: 06/13/2013  Time spent: >30 minutes  Recommendations for Outpatient Follow-up:  -follow EKG for evaluation of prolong QT -reassess needs for seroquel and if QT still elevated consider discontinue this drug -reassess BP and rate/rhythm and adjust cardizem dose as needed  Discharge Diagnoses:  Principal Problem:   Acute encephalopathy Active Problems:   Encephalopathy acute   Hypothermia   Atrial fibrillation   Dementia of Alzheimer's type with behavioral disturbance   Mild dehydration   Discharge Condition: stable and improved. Will be discharge home with family care and private caregiver. Family understands needs of 24/7 assistance and care.  Diet recommendation: heart healthy diet  Filed Weights   06/12/13 0857 06/12/13 2000 06/13/13 0402  Weight: 62.7 kg (138 lb 3.7 oz) 62.5 kg (137 lb 12.6 oz) 62.6 kg (138 lb 0.1 oz)    History of present illness:  80 y.o. female with pmh significant for dementia (with behavioral disturbances), atrial fibrillation and hx of colon cancer. Was found outside in cold weather very confused. EMS called and brought patient to ED. In ED other than hypothermia and confusion no other complaints or abnormalities where found. TRH called to admit patient for further evaluation/treatment. Temp on arrival 92.5; no signs of infection or septic appearance  EMS able to get in touch with patient's daughter, who reported that patient has a history of advance dementia and has a caregiver in the daytime but not overnight. Patient does not recall what happened. She denies any pain or injury   Hospital Course:  1-Acute encephalopathy: appears to be secondary to environmental exposure on top of underlying dementia.  -all secondary to hypothermia -RPR, TSH and B12 WNL -will resume home meds -CT head negative and no  focal neurologic deficit appreciated on exam -CXR w/o acute cardiopulmonary process, UA no suggesting UTI   2-Hypothermia: due to environmental exposure. On arrival to ED temp 92.5  -excellent response to rewarming process -VSS now and no focal abnormalities on exam -no signs of infection  3-Atrial fibrillation: not a candidate for chronic anticoagulation. (high risk for falls)  -continue ASA and cardizem   4-Dementia of Alzheimer's type with behavioral disturbance: unknown baseline. But definitely not safe for her be home alone.  -continue namenda and aricept  -continue remeron and seroquel QHS  -plan is to provide 24/7 care/assistance at home. -seroquel dose change to 37.5 mg due prolongation of QT (please follow as an outpatient for further adjustments  5-Mild dehydration: resolved with IVF's -patient eating and drinking ok now.  Procedures: See below for x-ray reports  Consultations:  None   Discharge Exam: Filed Vitals:   06/13/13 0800  BP: 136/60  Pulse:   Temp:   Resp: 21    General: pleasantly confused, afebrile, NAD Cardiovascular: rate controlled now, no rubs or gallops Respiratory: CTA bilaterally Abdomen:soft, NT, ND, positive BS Extremities: no edema Neuro: no focal deficit. CN intact  Discharge Instructions  Discharge Orders   Future Orders Complete By Expires   Discharge instructions  As directed    Comments:     Keep patient well hydrated Arrange follow up with PCP in 1 week Patient home under family care and plans for 24/7 private caregivers       Medication List         ALPRAZolam 1 MG tablet  Commonly known as:  XANAX  Take 1 mg by  mouth 2 (two) times daily.     aspirin EC 81 MG tablet  Take 81 mg by mouth daily as needed for mild pain (hemorrhoids).     diltiazem 120 MG 24 hr capsule  Commonly known as:  CARDIZEM CD  Take 1 capsule (120 mg total) by mouth daily.     donepezil 10 MG tablet  Commonly known as:  ARICEPT  Take 10  mg by mouth at bedtime.     memantine 10 MG tablet  Commonly known as:  NAMENDA  Take 10 mg by mouth 2 (two) times daily.     mirtazapine 15 MG tablet  Commonly known as:  REMERON  Take 15 mg by mouth at bedtime.     QUEtiapine 25 MG tablet  Commonly known as:  SEROQUEL  Take 37.5 mg by mouth at bedtime.     Vitamin D (Ergocalciferol) 50000 UNITS Caps capsule  Commonly known as:  DRISDOL  Take 50,000 Units by mouth every 7 (seven) days. On Wednesday     witch hazel-glycerin pad  Commonly known as:  TUCKS  Apply 1 application topically 3 (three) times daily as needed for hemorrhoids.       No Known Allergies     Follow-up Information   Follow up with Jani Gravel, MD. Schedule an appointment as soon as possible for a visit in 1 week.   Specialty:  Internal Medicine   Contact information:   [address] [place] [address] [phone]       The results of significant diagnostics from this hospitalization (including imaging, microbiology, ancillary and laboratory) are listed below for reference.    Significant Diagnostic Studies: Dg Chest 1 View  06/12/2013   CLINICAL DATA:  80 year old female with altered mental status. Shortness of Breath. Initial encounter.  EXAM: CHEST - 1 VIEW  COMPARISON:  CT Abdomen and Pelvis 06/12/2012 and earlier.  FINDINGS: Portable AP semi upright view at 0703 hrs. Small pleural effusions no longer evident. Stable cardiomegaly and mediastinal contours. Visualized tracheal air column is within normal limits. No pneumothorax or pulmonary edema. Increased interstitial markings appear stable. No acute pulmonary opacity.  IMPRESSION: No acute cardiopulmonary abnormality.   Electronically Signed   By: Lars Pinks M.D.   On: 06/12/2013 08:35   Ct Head Wo Contrast  06/12/2013   CLINICAL DATA:  80 year old female altered mental status. Initial encounter.  EXAM: CT HEAD WITHOUT CONTRAST  TECHNIQUE: Contiguous axial images were obtained from  the base of the skull through the vertex without intravenous contrast.  COMPARISON:  06/12/2012.  FINDINGS: Stable Mild sphenoid sinus mucosal thickening. Other Visualized paranasal sinuses and mastoids are clear. No acute osseous abnormality identified. No acute orbit or scalp soft tissue findings; small suboccipital probable sebaceous cyst on the right was partially visible before.  Small dural calcifications re- identified. Stable cerebral volume. Stable ventricle size and configuration. No midline shift, mass effect, or evidence of intracranial mass lesion. No evidence of cortically based acute infarction identified. No acute intracranial hemorrhage identified. No suspicious intracranial vascular hyperdensity. Gray-white matter differentiation is within normal limits throughout the brain.  IMPRESSION: No acute intracranial abnormality.  Stable non contrast CT appearance of the brain, with ventricular prominence favored to be ex vacuo in nature.   Electronically Signed   By: Lars Pinks M.D.   On: 06/12/2013 07:17    Microbiology: Recent Results (from the past 240 hour(s))  MRSA PCR SCREENING     Status: None   Collection  Time    06/12/13  8:57 AM      Result Value Range Status   MRSA by PCR NEGATIVE  NEGATIVE Final   Comment:            The GeneXpert MRSA Assay (FDA     approved for NASAL specimens     only), is one component of a     comprehensive MRSA colonization     surveillance program. It is not     intended to diagnose MRSA     infection nor to guide or     monitor treatment for     MRSA infections.    Labs: Basic Metabolic Panel:  Recent Labs Lab 06/12/13 0630 06/12/13 0647 06/13/13 0305  NA 141 142 142  K 4.3 4.2 3.5*  CL 101 102 104  CO2 28  --  27  GLUCOSE 103* 108* 96  BUN 10 9 9   CREATININE 0.68 0.80 0.79  CALCIUM 10.7*  --  9.5   Liver Function Tests:  Recent Labs Lab 06/12/13 0630 06/13/13 0305  AST 41* 33  ALT 22 17  ALKPHOS 68 62  BILITOT 0.4 0.7   PROT 9.1* 7.1  ALBUMIN 3.1* 2.4*   CBC:  Recent Labs Lab 06/12/13 0630 06/12/13 0647 06/13/13 0305  WBC 6.4  --  7.8  HGB 13.6 16.0* 11.5*  HCT 42.2 47.0* 35.5*  MCV 94.4  --  93.7  PLT 236  --  199    Signed:  Daniel Ritthaler  [facility] 06/13/2013, 9:06 AM

## 2013-06-13 NOTE — Progress Notes (Signed)
D/c instructions reviewed and discussed with pt's daughter; all questions answered and pt's daughter verbalized understanding. Pt d/c home with her daughter.

## 2013-06-15 LAB — URINE CULTURE

## 2015-06-22 ENCOUNTER — Encounter (HOSPITAL_COMMUNITY): Payer: Self-pay | Admitting: Emergency Medicine

## 2015-06-22 ENCOUNTER — Emergency Department (HOSPITAL_COMMUNITY): Payer: Medicare Other

## 2015-06-22 ENCOUNTER — Emergency Department (HOSPITAL_COMMUNITY)
Admission: EM | Admit: 2015-06-22 | Discharge: 2015-06-22 | Disposition: A | Discharge status: Home / Self Care | Payer: Medicare Other | Attending: Emergency Medicine | Admitting: Emergency Medicine

## 2015-06-22 DIAGNOSIS — W268XXA Contact with other sharp object(s), not elsewhere classified, initial encounter: Secondary | ICD-10-CM | POA: Insufficient documentation

## 2015-06-22 DIAGNOSIS — G309 Alzheimer's disease, unspecified: Secondary | ICD-10-CM | POA: Insufficient documentation

## 2015-06-22 DIAGNOSIS — F0280 Dementia in other diseases classified elsewhere without behavioral disturbance: Secondary | ICD-10-CM | POA: Diagnosis not present

## 2015-06-22 DIAGNOSIS — Y998 Other external cause status: Secondary | ICD-10-CM | POA: Diagnosis not present

## 2015-06-22 DIAGNOSIS — S61215A Laceration without foreign body of left ring finger without damage to nail, initial encounter: Secondary | ICD-10-CM

## 2015-06-22 DIAGNOSIS — S299XXA Unspecified injury of thorax, initial encounter: Secondary | ICD-10-CM | POA: Insufficient documentation

## 2015-06-22 DIAGNOSIS — Z79899 Other long term (current) drug therapy: Secondary | ICD-10-CM | POA: Diagnosis not present

## 2015-06-22 DIAGNOSIS — Z7982 Long term (current) use of aspirin: Secondary | ICD-10-CM | POA: Insufficient documentation

## 2015-06-22 DIAGNOSIS — Z85038 Personal history of other malignant neoplasm of large intestine: Secondary | ICD-10-CM | POA: Diagnosis not present

## 2015-06-22 DIAGNOSIS — Y9389 Activity, other specified: Secondary | ICD-10-CM | POA: Diagnosis not present

## 2015-06-22 DIAGNOSIS — I4891 Unspecified atrial fibrillation: Secondary | ICD-10-CM | POA: Diagnosis not present

## 2015-06-22 DIAGNOSIS — Y92009 Unspecified place in unspecified non-institutional (private) residence as the place of occurrence of the external cause: Secondary | ICD-10-CM | POA: Insufficient documentation

## 2015-06-22 DIAGNOSIS — N289 Disorder of kidney and ureter, unspecified: Secondary | ICD-10-CM

## 2015-06-22 DIAGNOSIS — S6992XA Unspecified injury of left wrist, hand and finger(s), initial encounter: Secondary | ICD-10-CM | POA: Diagnosis present

## 2015-06-22 LAB — BASIC METABOLIC PANEL
Anion gap: 10 (ref 5–15)
BUN: 21 mg/dL — ABNORMAL HIGH (ref 6–20)
CHLORIDE: 104 mmol/L (ref 101–111)
CO2: 26 mmol/L (ref 22–32)
CREATININE: 1.22 mg/dL — AB (ref 0.44–1.00)
Calcium: 10.1 mg/dL (ref 8.9–10.3)
GFR calc Af Amer: 45 mL/min — ABNORMAL LOW (ref >60)
GFR calc non Af Amer: 39 mL/min — ABNORMAL LOW (ref >60)
GLUCOSE: 88 mg/dL (ref 65–99)
Potassium: 3.5 mmol/L (ref 3.5–5.1)
Sodium: 140 mmol/L (ref 135–145)

## 2015-06-22 LAB — I-STAT TROPONIN, ED: Troponin i, poc: 0.01 ng/mL (ref 0.00–0.08)

## 2015-06-22 MED ORDER — LIDOCAINE HCL (PF) 1 % IJ SOLN
5.0000 mL | Freq: Once | INTRAMUSCULAR | Status: AC
  Administered 2015-06-22: 5 mL
  Filled 2015-06-22: qty 5

## 2015-06-22 MED ORDER — ACETAMINOPHEN 325 MG PO TABS
650.0000 mg | ORAL_TABLET | Freq: Once | ORAL | Status: AC
  Administered 2015-06-22: 650 mg via ORAL
  Filled 2015-06-22: qty 2

## 2015-06-22 NOTE — ED Notes (Signed)
Portable Xray at bedside.

## 2015-06-22 NOTE — ED Notes (Signed)
Pt taken to CT/X-ray without distress noted.

## 2015-06-22 NOTE — ED Provider Notes (Signed)
CSN: [id number]     Arrival date & time 06/22/15  1156 History   First MD Initiated Contact with Patient 06/22/15 1236     Chief Complaint  Patient presents with  . Laceration    The history is provided by a caregiver.    Ms. Youngers is an 82 y.o. female with history of alzheimer's dementia who presents to the ED for evaluation of laceration to her left ring finger. She is accompanied by her caregiver who provides her history due to dementia. Per pt's caregiver, she assumed care of the pt this morning and noticed laceration to pt's left ring finger. The overnight caregiver reported that pt did not get out of bed overnight so they think the pt might have slipped and grabbed onto the corner of the dresser in her room at some point and cut her finger then. They are not sure as there were not witnesses of the event. The laceration is now with well controlled bleeding and no visualized foreign bodies. Pt is reportedly not on any blood thinners and her daughter told the caregiver that last tetanus shot was within one year. Pt complains that her left hand is sore, especially the middle and ring fingers. She is neurovascularly intact with intact strength and sensation, good cap refill. Per her caregiver, pt's mentation is at baseline.  Past Medical History  Diagnosis Date  . Cancer colon   . Alzheimer's dementia   . A-fib [facility])    Past Surgical History  Procedure Laterality Date  . Colon surgery    . Appendectomy    . Cholecystectomy    . Tonsillectomy     Family History  Problem Relation Age of Onset  . Family history unknown: Yes   Social History  Substance Use Topics  . Smoking status: Never Smoker   . Smokeless tobacco: Never Used  . Alcohol Use: No   OB History    No data available     Review of Systems  Unable to perform ROS: Dementia      Allergies  Review of patient's allergies indicates no known allergies.  Home Medications   Prior to Admission medications     Medication Sig Start Date End Date Taking? Authorizing Provider  ALPRAZolam Duanne Moron) 1 MG tablet Take 1 mg by mouth 2 (two) times daily.    Historical Provider, MD  aspirin EC 81 MG tablet Take 81 mg by mouth daily as needed for mild pain (hemorrhoids).    Historical Provider, MD  diltiazem (CARDIZEM CD) 120 MG 24 hr capsule Take 1 capsule (120 mg total) by mouth daily. 06/13/13   Barton Dubois, MD  donepezil (ARICEPT) 10 MG tablet Take 10 mg by mouth at bedtime.    Historical Provider, MD  memantine (NAMENDA) 10 MG tablet Take 10 mg by mouth 2 (two) times daily.    Historical Provider, MD  mirtazapine (REMERON) 15 MG tablet Take 15 mg by mouth at bedtime.    Historical Provider, MD  QUEtiapine (SEROQUEL) 25 MG tablet Take 37.5 mg by mouth at bedtime.    Historical Provider, MD  Vitamin D, Ergocalciferol, (DRISDOL) 50000 UNITS CAPS capsule Take 50,000 Units by mouth every 7 (seven) days. On Wednesday    Historical Provider, MD  witch hazel-glycerin (TUCKS) pad Apply 1 application topically 3 (three) times daily as needed for hemorrhoids.    Historical Provider, MD   BP 123/73 mmHg  Pulse 63  Temp(Src) 97.7 F (36.5 C) (Oral)  Resp 20  SpO2 100%  Physical Exam  HENT:  Right Ear: External ear normal.  Left Ear: External ear normal.  Nose: Nose normal.  Mouth/Throat: Oropharynx is clear and moist. No oropharyngeal exudate.  Eyes: Conjunctivae and EOM are normal. Pupils are equal, round, and reactive to light.  Neck: Normal range of motion. Neck supple.  Cardiovascular: Normal rate, regular rhythm, normal heart sounds and intact distal pulses.   Pulmonary/Chest: Effort normal and breath sounds normal. No respiratory distress.    Mild tenderness at superior sternal edge  Abdominal: Soft. Bowel sounds are normal. She exhibits no distension. There is no tenderness.  Musculoskeletal: She exhibits no edema.  No c-spine, t-spine, or l-spine tenderness  Left ring finger with 1.5cm superficial  laceration. Intact strength and sensation. Good cap refill. Left 5th finger bruised. Diffusely ttp. FROM, intact strength and sensation.  No other visible or palpable injury.  Neurological: She is alert. No cranial nerve deficit.  Skin: Skin is warm and dry.  Psychiatric: She has a normal mood and affect.  Nursing note and vitals reviewed.   ED Course  Procedures (including critical care time) LACERATION REPAIR Performed by: Delrae Rend Authorized by: Delrae Rend Consent: Verbal consent obtained. Risks and benefits: risks, benefits and alternatives were discussed Consent given by: patient Patient identity confirmed: provided demographic data Prepped and Draped in normal sterile fashion Wound explored  Laceration Location: left 4th finger  Laceration Length: 1.5cm  No Foreign Bodies seen or palpated  Anesthesia: digital block  Local anesthetic: lidocaine 1% without epinephrine  Anesthetic total: 4 ml  Irrigation method: syringe Amount of cleaning: standard  Skin closure: 4-0 prolene  Number of sutures: 3  Technique: simple interrupted  Patient tolerance: Patient tolerated the procedure well with no immediate complications.   Labs Review Labs Reviewed  BASIC METABOLIC PANEL - Abnormal; Notable for the following:    BUN 21 (*)    Creatinine, Ser 1.22 (*)    GFR calc non Af Amer 39 (*)    GFR calc Af Amer 45 (*)    All other components within normal limits  Randolm Idol, ED    Imaging Review Dg Chest 2 View  06/22/2015  CLINICAL DATA:  [place] today at home.  No chest symptoms. EXAM: CHEST  2 VIEW COMPARISON:  06/12/2013. FINDINGS: The cardiac silhouette remains mildly enlarged. Clear lungs with normal vascularity. Thoracic spine and bilateral shoulder degenerative changes. No fracture or pneumothorax seen. IMPRESSION: No acute abnormality. Electronically Signed   By: Claudie Revering M.D.   On: 06/22/2015 15:02   Ct Head Wo Contrast  06/22/2015  CLINICAL DATA:   Recent fall EXAM: CT HEAD WITHOUT CONTRAST TECHNIQUE: Contiguous axial images were obtained from the base of the skull through the vertex without intravenous contrast. COMPARISON:  06/12/1948 FINDINGS: Bony calvarium is intact. Diffuse atrophic changes are again identified. No findings to suggest acute hemorrhage, acute infarction or space-occupying mass lesion are noted. IMPRESSION: Atrophic changes without acute abnormality. Electronically Signed   By: Inez Catalina M.D.   On: 06/22/2015 14:41   Dg Hand Complete Left  06/22/2015  CLINICAL DATA:  [place] at home, laceration LEFT ring finger distally EXAM: LEFT HAND - COMPLETE 3+ VIEW COMPARISON:  None FINDINGS: Fingers superimposed on lateral view limiting assessment. Diffuse osseous demineralization. Joint space narrowing at radiocarpal joint and minimally at the mid carpal joint. Chondrocalcinosis at the MCP joints diffusely as well as at the wrist. Spur formation at IP joint thumb and at first MCP joint as well as scattered  DIP joints. No acute fracture, dislocation, or bone destruction. IMPRESSION: Scattered degenerative changes and osseous demineralization. Chondrocalcinosis question question CPPD. No acute abnormalities. Electronically Signed   By: Lavonia Dana M.D.   On: 06/22/2015 13:39   I have personally reviewed and evaluated these images and lab results as part of my medical decision-making.   EKG Interpretation None      MDM   Final diagnoses:  Laceration of left ring finger w/o foreign body w/o damage to nail, initial encounter  Renal insufficiency    Pt is an 82 y.o. female with history of alzheimer's dementia presenting to the ED for evaluation of laceration to her left fourth finger after presumed fall. Injury was unwitnessed. Pt does not think she fell. She reports pain in her left hand but denies pain otherwise. L hand XR negative. Given unwitnessed possible fall, ordered non con CT head, CBC, BMP, i stat troponin, CXR, and EKG.  Trop negative. CXR negative. CT head negative. BMP is pending. After several sticks could not draw blood for CBC per staff. Given that pt is afebrile, not tachycardic, with no other symptoms suggesting infection or anemia, will cancel CBC. 3 simple interrupted sutures place to relieve tension at laceration site to promote better healing, though laceration is quite superficial.  BMP shows some renal insufficiency but otherwise unremarkable. No new meds. Likely due to dehydration. Discussed with pt and caregiver. Instructed to f/u with PCP within one week. Encouraged increased fluid intake.  Patient may be safely discharged home. Discussed reasons for return (fever, stitches break/wound reopens, purulent wound drainage, redness, increasing pain). Patient to follow-up with primary care provider or [facility] within 7-10 days for suture removal. ER return precautions given.  Anne Ng, PA-C 06/22/15 1753  Leonard Schwartz, MD 06/23/15 1250

## 2015-06-22 NOTE — Discharge Instructions (Signed)
Kathleen [name] was seen in the emergency room today for evaluation of a cut to her left hand and after a possible fall. Her X-rays and CT scan of her head were unremarkable. Her labs did show evidence of some changes in her kidney function. This is most likely due to dehydration. Please encourage her to drink plenty of fluids. Please call her primary care provider to schedule a follow up appointment to re-check her kidney function. She will also need the stitches removed from her left hand in ~7-10 days. Return to the ER for new or worsening symptoms.   Laceration Care, Adult A laceration is a cut that goes through all of the layers of the skin and into the tissue that is right under the skin. Some lacerations heal on their own. Others need to be closed with stitches (sutures), staples, skin adhesive strips, or skin glue. Proper laceration care minimizes the risk of infection and helps the laceration to heal better. HOW TO CARE FOR YOUR LACERATION If sutures or staples were used:  Keep the wound clean and dry.  If you were given a bandage (dressing), you should change it at least one time per day or as told by your health care provider. You should also change it if it becomes wet or dirty.  Keep the wound completely dry for the first 24 hours or as told by your health care provider. After that time, you may shower or bathe. However, make sure that the wound is not soaked in water until after the sutures or staples have been removed.  Clean the wound one time each day or as told by your health care provider:  Wash the wound with soap and water.  Rinse the wound with water to remove all soap.  Pat the wound dry with a clean towel. Do not rub the wound.  After cleaning the wound, apply a thin layer of antibiotic ointmentas told by your health care provider. This will help to prevent infection and keep the dressing from sticking to the wound.  Have the sutures or staples removed as told by your  health care provider. If skin adhesive strips were used:  Keep the wound clean and dry.  If you were given a bandage (dressing), you should change it at least one time per day or as told by your health care provider. You should also change it if it becomes dirty or wet.  Do not get the skin adhesive strips wet. You may shower or bathe, but be careful to keep the wound dry.  If the wound gets wet, pat it dry with a clean towel. Do not rub the wound.  Skin adhesive strips fall off on their own. You may trim the strips as the wound heals. Do not remove skin adhesive strips that are still stuck to the wound. They will fall off in time. If skin glue was used:  Try to keep the wound dry, but you may briefly wet it in the shower or bath. Do not soak the wound in water, such as by swimming.  After you have showered or bathed, gently pat the wound dry with a clean towel. Do not rub the wound.  Do not do any activities that will make you sweat heavily until the skin glue has fallen off on its own.  Do not apply liquid, cream, or ointment medicine to the wound while the skin glue is in place. Using those may loosen the film before the wound has healed.  If you were given a bandage (dressing), you should change it at least one time per day or as told by your health care provider. You should also change it if it becomes dirty or wet.  If a dressing is placed over the wound, be careful not to apply tape directly over the skin glue. Doing that may cause the glue to be pulled off before the wound has healed.  Do not pick at the glue. The skin glue usually remains in place for 5-10 days, then it falls off of the skin. General Instructions  Take over-the-counter and prescription medicines only as told by your health care provider.  If you were prescribed an antibiotic medicine or ointment, take or apply it as told by your doctor. Do not stop using it even if your condition improves.  To help prevent  scarring, make sure to cover your wound with sunscreen whenever you are outside after stitches are removed, after adhesive strips are removed, or when glue remains in place and the wound is healed. Make sure to wear a sunscreen of at least 30 SPF.  Do not scratch or pick at the wound.  Keep all follow-up visits as told by your health care provider. This is important.  Check your wound every day for signs of infection. Watch for:  Redness, swelling, or pain.  Fluid, blood, or pus.  Raise (elevate) the injured area above the level of your heart while you are sitting or lying down, if possible. SEEK MEDICAL CARE IF:  You received a tetanus shot and you have swelling, severe pain, redness, or bleeding at the injection site.  You have a fever.  A wound that was closed breaks open.  You notice a bad smell coming from your wound or your dressing.  You notice something coming out of the wound, such as wood or glass.  Your pain is not controlled with medicine.  You have increased redness, swelling, or pain at the site of your wound.  You have fluid, blood, or pus coming from your wound.  You notice a change in the color of your skin near your wound.  You need to change the dressing frequently due to fluid, blood, or pus draining from the wound.  You develop a new rash.  You develop numbness around the wound. SEEK IMMEDIATE MEDICAL CARE IF:  You develop severe swelling around the wound.  Your pain suddenly increases and is severe.  You develop painful lumps near the wound or on skin that is anywhere on your body.  You have a red streak going away from your wound.  The wound is on your hand or foot and you cannot properly move a finger or toe.  The wound is on your hand or foot and you notice that your fingers or toes look pale or bluish.   This information is not intended to replace advice given to you by your health care provider. Make sure you discuss any questions you  have with your health care provider.   Document Released: 05/14/2005 Document Revised: 09/28/2014 Document Reviewed: 05/10/2014 Elsevier Interactive Patient Education [employer].

## 2015-06-22 NOTE — ED Notes (Signed)
Bed: [facility] Expected date:  Expected time:  Means of arrival:  Comments: EMS- 82yo F, finger lac/Hx of dementia

## 2015-06-22 NOTE — ED Notes (Signed)
Awake. Verbally responsive. A/O x4. Resp even and unlabored. No audible adventitious breath sounds noted. ABC's intact. Caregiver at bedside. 

## 2015-06-22 NOTE — ED Notes (Signed)
Pt in radiology will obtain EKG upon return

## 2015-06-22 NOTE — ED Notes (Signed)
Attempt lab draw in right a/c

## 2015-06-22 NOTE — ED Notes (Signed)
Pt arrived via EMS with report of 4th finger lac with controlled bleeding and bruising to 5th finger tip. Pt has hx of Alzheimer and unable to recall what happened or when it happened.

## 2015-06-22 NOTE — ED Notes (Signed)
Bed: [id number] Expected date: 06/22/15 Expected time:  Means of arrival:  Comments: Hold for [place] [facility]

## 2015-07-13 ENCOUNTER — Encounter: Payer: Self-pay | Admitting: Podiatry

## 2015-07-13 ENCOUNTER — Ambulatory Visit (INDEPENDENT_AMBULATORY_CARE_PROVIDER_SITE_OTHER): Payer: Medicare Other | Admitting: Podiatry

## 2015-07-13 DIAGNOSIS — M79606 Pain in leg, unspecified: Secondary | ICD-10-CM | POA: Diagnosis not present

## 2015-07-13 DIAGNOSIS — B351 Tinea unguium: Secondary | ICD-10-CM

## 2015-07-13 DIAGNOSIS — Q828 Other specified congenital malformations of skin: Secondary | ICD-10-CM | POA: Diagnosis not present

## 2015-07-13 NOTE — Progress Notes (Signed)
SUBJECTIVE: 82 y.o. year old female presents accompanied by her daughter with problematic callus that she is pick on it. Daughter stated that she has dementia and hemorrhoid problem.   REVIEW OF SYSTEMS: A comprehensive review of systems was negative except for: Dementia and Hemorrhoid.  OBJECTIVE: DERMATOLOGIC EXAMINATION: Nails: Thick dystrophic nails x 10. Pre ulcerative plantar callus with intra dermal bleeding under 5th MPJ left, mild callus under 5th MPJ right.  VASCULAR EXAMINATION OF LOWER LIMBS: Pedal pulses: Pedal pulses are not palpable on both feet. Temperature gradient from tibial crest to dorsum of foot is within normal bilateral.  NEUROLOGIC EXAMINATION OF THE LOWER LIMBS: All epicritic and tactile sensations grossly intact.   MUSCULOSKELETAL EXAMINATION: Rectus foot without gross deformities.  ASSESSMENT: Pre ulcerative painful callus under 5th MPJ left. Mycotic nails x 10. Pain in lower limbs.  PLAN: All nails and calluses debrided. Return in one month to repeat debridement on 5th MPJ left foot.

## 2015-07-13 NOTE — Patient Instructions (Signed)
Seen for painful calluses and hypertrophic nails. All calluses and nails debrided. Return in one month to repeat treatment for left foot lesion.

## 2015-07-15 ENCOUNTER — Ambulatory Visit: Payer: Self-pay | Admitting: Podiatry

## 2015-10-10 ENCOUNTER — Encounter: Payer: Self-pay | Admitting: Podiatry

## 2015-10-10 ENCOUNTER — Ambulatory Visit (INDEPENDENT_AMBULATORY_CARE_PROVIDER_SITE_OTHER): Payer: Medicare Other | Admitting: Podiatry

## 2015-10-10 VITALS — BP 190/74 | HR 63

## 2015-10-10 DIAGNOSIS — L97521 Non-pressure chronic ulcer of other part of left foot limited to breakdown of skin: Secondary | ICD-10-CM | POA: Diagnosis not present

## 2015-10-10 DIAGNOSIS — M79606 Pain in leg, unspecified: Secondary | ICD-10-CM

## 2015-10-10 DIAGNOSIS — B351 Tinea unguium: Secondary | ICD-10-CM | POA: Diagnosis not present

## 2015-10-10 NOTE — Patient Instructions (Signed)
Seen for hypertrophic nails and ulcerating callus under 5th MPJ left foot. All nails debrided. Left foot callus debrided and padded. Keep the pad for a week and return in 2 weeks.

## 2015-10-10 NOTE — Progress Notes (Signed)
SUBJECTIVE: 82 y.o. year old female presents accompanied by her daughter with problematic nails and painful callus on left foot.  Daughter stated that she has dementia.  REVIEW OF SYSTEMS: A comprehensive review of systems was negative except for: Dementia and Hemorrhoid.  OBJECTIVE: DERMATOLOGIC EXAMINATION: Nails: Thick dystrophic nails x 10. Pre ulcerative plantar callus with intra dermal bleeding under 5th MPJ left, mild callus under 5th MPJ right. Base is moist but not draining.  VASCULAR EXAMINATION OF LOWER LIMBS: Pedal pulses: Pedal pulses are not palpable on both feet. Temperature gradient from tibial crest to dorsum of foot is within normal bilateral.  NEUROLOGIC EXAMINATION OF THE LOWER LIMBS: All epicritic and tactile sensations grossly intact.   MUSCULOSKELETAL EXAMINATION: Digital contracture 2nd bilateral.   ASSESSMENT: Ulcerative painful callus with intradermal bleeding under 5th MPJ left. Mycotic nails x 10. Pain in lower limbs.  PLAN: All nails and calluses debrided. Aperture pad with Amerigel ointment placed under left foot with instruction to keep th pad x 1 week and return in 2 weeks.

## 2017-01-19 ENCOUNTER — Inpatient Hospital Stay (HOSPITAL_COMMUNITY)
Admission: EM | Admit: 2017-01-19 | Discharge: 2017-01-28 | DRG: 853 | Disposition: A | Discharge status: Skilled Nursing Facility | Payer: Medicare Other | Attending: Internal Medicine | Admitting: Internal Medicine

## 2017-01-19 ENCOUNTER — Emergency Department (HOSPITAL_COMMUNITY): Payer: Medicare Other

## 2017-01-19 ENCOUNTER — Encounter (HOSPITAL_COMMUNITY): Payer: Self-pay | Admitting: Emergency Medicine

## 2017-01-19 DIAGNOSIS — R509 Fever, unspecified: Secondary | ICD-10-CM

## 2017-01-19 DIAGNOSIS — F0281 Dementia in other diseases classified elsewhere with behavioral disturbance: Secondary | ICD-10-CM | POA: Diagnosis present

## 2017-01-19 DIAGNOSIS — Z515 Encounter for palliative care: Secondary | ICD-10-CM

## 2017-01-19 DIAGNOSIS — Z681 Body mass index (BMI) 19 or less, adult: Secondary | ICD-10-CM

## 2017-01-19 DIAGNOSIS — A419 Sepsis, unspecified organism: Secondary | ICD-10-CM | POA: Diagnosis not present

## 2017-01-19 DIAGNOSIS — I482 Chronic atrial fibrillation: Secondary | ICD-10-CM | POA: Diagnosis not present

## 2017-01-19 DIAGNOSIS — G9341 Metabolic encephalopathy: Secondary | ICD-10-CM | POA: Diagnosis present

## 2017-01-19 DIAGNOSIS — I4891 Unspecified atrial fibrillation: Secondary | ICD-10-CM | POA: Diagnosis present

## 2017-01-19 DIAGNOSIS — L89154 Pressure ulcer of sacral region, stage 4: Secondary | ICD-10-CM

## 2017-01-19 DIAGNOSIS — E860 Dehydration: Secondary | ICD-10-CM | POA: Diagnosis present

## 2017-01-19 DIAGNOSIS — E8809 Other disorders of plasma-protein metabolism, not elsewhere classified: Secondary | ICD-10-CM | POA: Diagnosis not present

## 2017-01-19 DIAGNOSIS — G309 Alzheimer's disease, unspecified: Secondary | ICD-10-CM | POA: Diagnosis present

## 2017-01-19 DIAGNOSIS — Z85038 Personal history of other malignant neoplasm of large intestine: Secondary | ICD-10-CM | POA: Diagnosis not present

## 2017-01-19 DIAGNOSIS — E876 Hypokalemia: Secondary | ICD-10-CM | POA: Diagnosis present

## 2017-01-19 DIAGNOSIS — R1310 Dysphagia, unspecified: Secondary | ICD-10-CM | POA: Diagnosis present

## 2017-01-19 DIAGNOSIS — I480 Paroxysmal atrial fibrillation: Secondary | ICD-10-CM | POA: Diagnosis present

## 2017-01-19 DIAGNOSIS — Z7189 Other specified counseling: Secondary | ICD-10-CM

## 2017-01-19 DIAGNOSIS — Z7401 Bed confinement status: Secondary | ICD-10-CM

## 2017-01-19 DIAGNOSIS — Z9181 History of falling: Secondary | ICD-10-CM

## 2017-01-19 DIAGNOSIS — D6489 Other specified anemias: Secondary | ICD-10-CM | POA: Diagnosis present

## 2017-01-19 DIAGNOSIS — G9340 Encephalopathy, unspecified: Secondary | ICD-10-CM

## 2017-01-19 DIAGNOSIS — Z7982 Long term (current) use of aspirin: Secondary | ICD-10-CM

## 2017-01-19 DIAGNOSIS — D509 Iron deficiency anemia, unspecified: Secondary | ICD-10-CM | POA: Diagnosis present

## 2017-01-19 DIAGNOSIS — L89159 Pressure ulcer of sacral region, unspecified stage: Secondary | ICD-10-CM | POA: Diagnosis present

## 2017-01-19 DIAGNOSIS — R627 Adult failure to thrive: Secondary | ICD-10-CM | POA: Diagnosis present

## 2017-01-19 DIAGNOSIS — E43 Unspecified severe protein-calorie malnutrition: Secondary | ICD-10-CM | POA: Diagnosis present

## 2017-01-19 DIAGNOSIS — L89221 Pressure ulcer of left hip, stage 1: Secondary | ICD-10-CM | POA: Diagnosis present

## 2017-01-19 DIAGNOSIS — Z66 Do not resuscitate: Secondary | ICD-10-CM | POA: Diagnosis present

## 2017-01-19 DIAGNOSIS — F02818 Dementia in other diseases classified elsewhere, unspecified severity, with other behavioral disturbance: Secondary | ICD-10-CM | POA: Diagnosis present

## 2017-01-19 LAB — URINALYSIS, ROUTINE W REFLEX MICROSCOPIC
Bilirubin Urine: NEGATIVE
GLUCOSE, UA: NEGATIVE mg/dL
HGB URINE DIPSTICK: NEGATIVE
Ketones, ur: NEGATIVE mg/dL
Leukocytes, UA: NEGATIVE
Nitrite: NEGATIVE
PH: 6 (ref 5.0–8.0)
Protein, ur: 30 mg/dL — AB
SPECIFIC GRAVITY, URINE: 1.02 (ref 1.005–1.030)
WBC UA: NONE SEEN WBC/hpf (ref 0–5)

## 2017-01-19 LAB — COMPREHENSIVE METABOLIC PANEL
ALK PHOS: 74 U/L (ref 38–126)
ALT: 14 U/L (ref 14–54)
AST: 24 U/L (ref 15–41)
Albumin: 1.9 g/dL — ABNORMAL LOW (ref 3.5–5.0)
Anion gap: 7 (ref 5–15)
BUN: 11 mg/dL (ref 6–20)
CALCIUM: 9.2 mg/dL (ref 8.9–10.3)
CO2: 27 mmol/L (ref 22–32)
CREATININE: 0.76 mg/dL (ref 0.44–1.00)
Chloride: 105 mmol/L (ref 101–111)
GFR calc non Af Amer: >60 mL/min (ref >60)
Glucose, Bld: 116 mg/dL — ABNORMAL HIGH (ref 65–99)
Potassium: 3.3 mmol/L — ABNORMAL LOW (ref 3.5–5.1)
SODIUM: 139 mmol/L (ref 135–145)
Total Bilirubin: 1 mg/dL (ref 0.3–1.2)
Total Protein: 6.9 g/dL (ref 6.5–8.1)

## 2017-01-19 LAB — CBC WITH DIFFERENTIAL/PLATELET
BASOS PCT: 0 %
Basophils Absolute: 0 10*3/uL (ref 0.0–0.1)
EOS ABS: 0 10*3/uL (ref 0.0–0.7)
Eosinophils Relative: 0 %
HCT: 29.2 % — ABNORMAL LOW (ref 36.0–46.0)
HEMOGLOBIN: 9.2 g/dL — AB (ref 12.0–15.0)
Lymphocytes Relative: 7 %
Lymphs Abs: 1 10*3/uL (ref 0.7–4.0)
MCH: 28.2 pg (ref 26.0–34.0)
MCHC: 31.5 g/dL (ref 30.0–36.0)
MCV: 89.6 fL (ref 78.0–100.0)
MONOS PCT: 5 %
Monocytes Absolute: 0.8 10*3/uL (ref 0.1–1.0)
NEUTROS PCT: 88 %
Neutro Abs: 12.8 10*3/uL — ABNORMAL HIGH (ref 1.7–7.7)
PLATELETS: 233 10*3/uL (ref 150–400)
RBC: 3.26 MIL/uL — ABNORMAL LOW (ref 3.87–5.11)
RDW: 17.2 % — AB (ref 11.5–15.5)
WBC: 14.6 10*3/uL — ABNORMAL HIGH (ref 4.0–10.5)

## 2017-01-19 LAB — I-STAT CG4 LACTIC ACID, ED: LACTIC ACID, VENOUS: 1.54 mmol/L (ref 0.5–1.9)

## 2017-01-19 MED ORDER — SODIUM CHLORIDE 0.9 % IV BOLUS (SEPSIS)
1000.0000 mL | Freq: Once | INTRAVENOUS | Status: AC
  Administered 2017-01-19: 1000 mL via INTRAVENOUS

## 2017-01-19 MED ORDER — ACETAMINOPHEN 650 MG RE SUPP
650.0000 mg | Freq: Once | RECTAL | Status: AC
  Administered 2017-01-19: 650 mg via RECTAL
  Filled 2017-01-19: qty 1

## 2017-01-19 NOTE — H&P (Signed)
Kathleen [name] CVE:[id number] DOB: [birth year] DOA: 01/19/2017     PCP: Jani Gravel, MD   Outpatient Specialists: none Patient coming from:  home Lives   With family    Chief Complaint: Confusion decreased by mouth intake  HPI: Kathleen [name] is a 83 y.o. female with medical history significant of dementia (with behavioral disturbances), atrial fibrillation and hx of colon cancer    Presented with infusion for past few days she has baseline dementia and has some confusion at baseline but for the past few days has not been herself she have had strong order of urine as well as strong order coming from her sacral decubitus ulcer family thinks it's been present for at least 2 weeks patient had declined progressively she has been refusing to eat. Have had trouble ambulating and mainly been laying down in bed for the past few weeks.  Patient was noted to be febrile she has home health at home meds been taking care of by home health staff. Family also reports occasional coughing but not productive and mild. Denies any diarrhea nausea vomiting   Regarding pertinent Chronic problems:  History of atrial fibrillation rate controlled with Cardizem not on anticoagulation History of dementia on Aricept and Namenda suspected the Alzheimer's type Has had UTIs in the past with multiresistant Escherichia coli thus diagnosed in 2015 attack Tamela sensitive to ceftriaxone and gentamicin and nitrofurantoin and tobramycin IN ER:  Temp (24hrs), Avg:99.3 F (37.4 C), Min:98.2 F (36.8 C), Max:100.8 F (38.2 C)      on arrival  ED Triage Vitals [01/19/17 1947]  Enc Vitals Group     BP 117/63     Pulse Rate 92     Resp 17     Temp 98.2 F (36.8 C)     Temp Source Oral     SpO2 100 %     Weight      Height      Head Circumference      Peak Flow      Pain Score      Pain Loc      Pain Edu?      Excl. in [place]?     Latest RR 17 100% Hr 76 BP 136/80 Lactic acid 1.54 Na 139  K 3.3 Cr 0.76  Alb  1.9 WBC  14.6 Hg 9.2  plt 233   CXR: non-acute Sacral film no osteomyelitis Following Medications were ordered in ER: Medications  sodium chloride 0.9 % bolus 1,000 mL (0 mLs Intravenous Stopped 01/19/17 2227)  acetaminophen (TYLENOL) suppository 650 mg (650 mg Rectal Given 01/19/17 2142)     Hospitalist was called for admission for Sepsis likely secondary to infected decubitus ulcer  Review of Systems:    Pertinent positives include:  Fevers, chills, fatigue,confusion  Constitutional:  No weight loss, night sweats, weight loss  HEENT:  No headaches, Difficulty swallowing,Tooth/dental problems,Sore throat,  No sneezing, itching, ear ache, nasal congestion, post nasal drip,  Cardio-vascular:  No chest pain, Orthopnea, PND, anasarca, dizziness, palpitations.no Bilateral lower extremity swelling  GI:  No heartburn, indigestion, abdominal pain, nausea, vomiting, diarrhea, change in bowel habits, loss of appetite, melena, blood in stool, hematemesis Resp:  no shortness of breath at rest. No dyspnea on exertion, No excess mucus, no productive cough, No non-productive cough, No coughing up of blood.No change in color of mucus .No wheezing. Skin:  no rash or lesions. No jaundice GU:  no dysuria, change in color of urine, no urgency  or frequency. No straining to urinate.  No flank pain.  Musculoskeletal:  No joint pain or no joint swelling. No decreased range of motion. No back pain.  Psych:  No change in mood or affect. No depression or anxiety. No memory loss.  Neuro: no localizing neurological complaints, no tingling, no weakness, no double vision, no gait abnormality, no slurred speech, no   As per HPI otherwise 10 point review of systems negative.   Past Medical History: Past Medical History:  Diagnosis Date  . A-fib ([place])   . Alzheimer's dementia   . Cancer colon    Past Surgical History:  Procedure Laterality Date  . APPENDECTOMY    . CHOLECYSTECTOMY    . COLON SURGERY    .  TONSILLECTOMY       Social History:  Ambulatory lately  bed bound    reports that she has never smoked. She has never used smokeless tobacco. She reports that she does not drink alcohol or use drugs.  Allergies:   Allergies  Allergen Reactions  . Penicillins Other (See Comments)    Pt family and pt does not know the reaction.       Family History:   Family History  Problem Relation Age of Onset  . Family history unknown: Yes    Medications: Prior to Admission medications   Medication Sig Start Date End Date Taking? Authorizing Provider  ALPRAZolam Duanne Moron) 1 MG tablet Take 0.5-1 mg by mouth 2 (two) times daily. 0.5 tablet in the am and 1mg  at bedtime   Yes [provider]  aspirin EC 81 MG tablet Take 81 mg by mouth daily as needed for mild pain (hemorrhoids).   Yes [provider]  Cyanocobalamin (VITAMIN B-12 CR PO) Take 1 tablet by mouth daily.   Yes [provider]  donepezil (ARICEPT) 10 MG tablet Take 10 mg by mouth at bedtime.   Yes [provider]  memantine (NAMENDA) 10 MG tablet Take 10 mg by mouth 2 (two) times daily.   Yes [provider]  QUEtiapine (SEROQUEL) 25 MG tablet Take 37.5 mg by mouth at bedtime.   Yes [provider]  Vitamin D, Ergocalciferol, (DRISDOL) 50000 UNITS CAPS capsule Take 50,000 Units by mouth every 7 (seven) days. On Wednesday   Yes [provider]  diltiazem (CARDIZEM CD) 120 MG 24 hr capsule Take 1 capsule (120 mg total) by mouth daily. Patient not taking: Reported on 01/19/2017 06/13/13   Barton Dubois, MD    Physical Exam: Patient Vitals for the past 24 hrs:  BP Temp Temp src Pulse Resp SpO2  01/19/17 2229 - 99.1 F (37.3 C) Rectal - - -  01/19/17 2227 136/80 99.1 F (37.3 C) Rectal 76 17 100 %  01/19/17 1948 - (!) 100.8 F (38.2 C) Rectal - - -  01/19/17 1947 117/63 98.2 F (36.8 C) Oral 92 17 100 %    1. General:  in No Acute distress   Chronically ill  cachectic  -appearing 2. Psychological: somnolent  not  Oriented 3. Head/ENT:   Dry Mucous Membranes                          Head Non traumatic, neck supple                            Poor Dentition 4. SKIN:  decreased Skin turgor,  Skin clean Dry Sacral  decubitus ulcer malodorous with drainage 5. Heart: Regular rate and rhythm no  Murmur, no Rub or gallop 6. Lungs:  no wheezes or crackles   7. Abdomen: Soft,  non-tender, Non distended  8. Lower extremities: no clubbing, cyanosis, or edema 9. Neurologically Grossly intact, moving all 4 extremities equally response to painful stimuli 10. MSK: Normal range of motion   body mass index is unknown because there is no height or weight on file.  Labs on Admission:   Labs on Admission: I have personally reviewed following labs and imaging studies  CBC:  Recent Labs Lab 01/19/17 2011  WBC 14.6*  NEUTROABS 12.8*  HGB 9.2*  HCT 29.2*  MCV 89.6  PLT 295   Basic Metabolic Panel:  Recent Labs Lab 01/19/17 2011  NA 139  K 3.3*  CL 105  CO2 27  GLUCOSE 116*  BUN 11  CREATININE 0.76  CALCIUM 9.2   GFR: CrCl cannot be calculated (Unknown ideal weight.). Liver Function Tests:  Recent Labs Lab 01/19/17 2011  AST 24  ALT 14  ALKPHOS 74  BILITOT 1.0  PROT 6.9  ALBUMIN 1.9*   No results for input(s): LIPASE, AMYLASE in the last 168 hours. No results for input(s): AMMONIA in the last 168 hours. Coagulation Profile: No results for input(s): INR, PROTIME in the last 168 hours. Cardiac Enzymes: No results for input(s): CKTOTAL, CKMB, CKMBINDEX, TROPONINI in the last 168 hours. BNP (last 3 results) No results for input(s): PROBNP in the last 8760 hours. HbA1C: No results for input(s): HGBA1C in the last 72 hours. CBG: No results for input(s): GLUCAP in the last 168 hours. Lipid Profile: No results for input(s): CHOL, HDL, LDLCALC, TRIG, CHOLHDL, LDLDIRECT in the last 72 hours. Thyroid Function Tests: No results for  input(s): TSH, T4TOTAL, FREET4, T3FREE, THYROIDAB in the last 72 hours. Anemia Panel: No results for input(s): VITAMINB12, FOLATE, FERRITIN, TIBC, IRON, RETICCTPCT in the last 72 hours. Urine analysis:    Component Value Date/Time   COLORURINE YELLOW 01/19/2017 2000   APPEARANCEUR CLEAR 01/19/2017 2000   LABSPEC 1.020 01/19/2017 2000   PHURINE 6.0 01/19/2017 2000   GLUCOSEU NEGATIVE 01/19/2017 2000   HGBUR NEGATIVE 01/19/2017 2000   BILIRUBINUR NEGATIVE 01/19/2017 2000   [place] NEGATIVE 01/19/2017 2000   PROTEINUR 30 (A) 01/19/2017 2000   UROBILINOGEN 0.2 06/12/2013 0632   NITRITE NEGATIVE 01/19/2017 2000   LEUKOCYTESUR NEGATIVE 01/19/2017 2000   Sepsis Labs: @LABRCNTIP (procalcitonin:4,lacticidven:4) )No results found for this or any previous visit (from the past 240 hour(s)).    UA   no evidence of UTI    No results found for: HGBA1C  CrCl cannot be calculated (Unknown ideal weight.).  BNP (last 3 results) No results for input(s): PROBNP in the last 8760 hours.   ECG REPORT  Independently reviewed Rate:94  Rhythm: SR ST&T Change: Somewhat flat T waves QTC 481  There were no vitals filed for this visit.   Cultures:    Component Value Date/Time   SDES IN/OUT CATH URINE 06/12/2013 1542   SPECREQUEST NONE 06/12/2013 1542   CULT  06/12/2013 1542    ESCHERICHIA COLI Note: Two isolates with different morphologies were identified as the same organism.The most resistant organism was reported. Performed at [facility] 06/15/2013 FINAL 06/12/2013 1542     Radiological Exams on Admission: Dg Chest 2 View  Result Date: 01/19/2017 CLINICAL DATA:  Increased confusion over the past several days. EXAM: CHEST  2 VIEW COMPARISON:  06/22/2015 FINDINGS:  Stable cardiomegaly with aortic atherosclerosis. No acute pneumonic consolidation, effusion or pneumothorax. No overt pulmonary edema. Osteoarthritis of both AC and glenohumeral joints with high-riding  humeral heads consistent with chronic rotator cuff tears. IMPRESSION: 1. No active cardiopulmonary disease. 2. Stable cardiomegaly with aortic atherosclerosis. 3. Osteoarthritis of both shoulders. Electronically Signed   By: Ashley Royalty M.D.   On: 01/19/2017 22:19   Dg Sacrum/coccyx  Result Date: 01/19/2017 CLINICAL DATA:  Decubitus ulcer EXAM: SACRUM AND COCCYX - 2+ VIEW COMPARISON:  CT from 06/12/2012. FINDINGS: Sclerosis about both SI joints consistent with osteoarthritis. Surgical clips project over the mid pelvis. Soft tissue ulceration over the the dorsum of the mid sacrum. No conclusive evidence for acute osteomyelitis. Bones however are slightly demineralized in appearance limiting assessment. There is lower lumbar degenerative disc disease from L4 through S1. IMPRESSION: 1. Lower lumbar spondylosis with degenerative disc disease. 2. Decubitus ulcer overlying the dorsum of the mid sacrum without definite radiographic evidence of acute osteomyelitis. Electronically Signed   By: Ashley Royalty M.D.   On: 01/19/2017 22:21    Chart has been reviewed    Assessment/Plan   83 y.o. female with medical history significant of dementia (with behavioral disturbances), atrial fibrillation and hx of colon cancer  Admitted for  Sepsis likely secondary to infected decubitus ulcer   Present on Admission: . Sepsis ([place]) - most likely secondary to decubitus ulcer. Patient appears to be acutely ill. Discussed with family possibility of poor prognosis given overall frailty. At this point will continue with IV antibiotics and IV fluids order palliative care consult family is interested in discussing overall goals of care states at this point she wishes to BE DNR/DNI as per her living will . Atrial fibrillation ([place]) -           - CHA2DS2 vas score   Not on anticoagulation secondary to Risk of Falls,         -  Rate control:  Currently controlled with  Diltiazem      . Acute encephalopathy -   - most likely  multifactorial secondary to combination of  infection  mild dehydration secondary to decreased by mouth intake,    - Will rehydrate   - treat underlining infection   - Hold contributing medications   - if no improvement may need further imaging to evaluate for CNS pathology  . Dementia of Alzheimer's type with behavioral disturbance - progressively worsening expect some degree of sundowning while hospitalized . History of colon cancer -  currently stable . Sacral decubitus ulcer - most likely source of infection will treat with broad-spectrum antibiotics for now. Patient poor nutritional status likely prevents healing. Patient may need admission to nursing home facility for additional management of wound  . Hypokalemia - - will replace and repeat in AM,  check magnesium level and replace as needed  . Hypoalbuminemia we'll order nutritional consult will check prealbumin   Other plan as per orders.  DVT prophylaxis:    Lovenox     Code Status:  DNR/DNI  e as per family   Family Communication:   Family   at  Bedside  plan of care was discussed with   Daughter  Disposition Plan:     likely will need placement for rehabilitation                          Would benefit from PT/OT eval prior to DC   ordered  Social Work    Nutrition  Palliative care   consulted                          Consults called: none   Admission status:   inpatient     Level of care   tele          I have spent a total of 56 min on this admission  Phillip Sandler 01/20/2017, 12:27 AM   [facility]  Pager [phone]   after 2 AM please page floor coverage PA If 7AM-7PM, please contact the day team taking care of the patient  [email or web address]  Password [email or web address]

## 2017-01-19 NOTE — ED Notes (Signed)
Bed: VH84 Expected date:  Expected time:  Means of arrival:  Comments: 83 yo AMS

## 2017-01-19 NOTE — ED Provider Notes (Signed)
[facility] DEPT Provider Note   CSN: [id number] Arrival date & time: 01/19/17  1927     History   Chief Complaint Chief Complaint  Patient presents with  . Altered Mental Status    HPI Kathleen [name] is a 83 y.o. female w PMHx of afib, dementia of alzheimer's type, colon cancer, BIB family for altered mental status described as "increased confusion" for the past few days. Patient lives at home alone with 24 hour home health care including CNA and RNs. Her home health nurse reported that her appetite has been decreased the past few days, she has been more sedentary, and her urine has been malodorous. Her daughter reports that her mental baseline waxes and wanes between lucid thoughts and confusion, however she has been less verbal these past few days and more confused. She also reports decubitus ulcer to the sacrum for an unknown period of time, however home health staff has been caring for it and laying her on her side when possible. She reports a mild intermittent cough. She denies known fever, vomiting, recent falls, anticoagulation, or other known symptoms.  LEVEL 5 CAVEAT DUE TO DEMENTIA.  The history is provided by a relative. The history is limited by the condition of the patient.    Past Medical History:  Diagnosis Date  . A-fib ([place])   . Alzheimer's dementia   . Cancer colon     Patient Active Problem List   Diagnosis Date Noted  . Acute encephalopathy 06/12/2013  . Hypothermia 06/12/2013  . Atrial fibrillation ([place]) 06/12/2013  . Dementia of Alzheimer's type with behavioral disturbance 06/12/2013  . Mild dehydration 06/12/2013  . Encephalopathy acute 06/12/2012  . Fever 06/12/2012  . UTI (lower urinary tract infection) 06/12/2012  . History of colon cancer 06/12/2012    Past Surgical History:  Procedure Laterality Date  . APPENDECTOMY    . CHOLECYSTECTOMY    . COLON SURGERY    . TONSILLECTOMY      OB History    No data available       Home  Medications    Prior to Admission medications   Medication Sig Start Date End Date Taking? Authorizing Provider  ALPRAZolam Duanne Moron) 1 MG tablet Take 1 mg by mouth 2 (two) times daily.    [provider]  aspirin EC 81 MG tablet Take 81 mg by mouth daily as needed for mild pain (hemorrhoids).    [provider]  diltiazem (CARDIZEM CD) 120 MG 24 hr capsule Take 1 capsule (120 mg total) by mouth daily. 06/13/13   Barton Dubois, MD  donepezil (ARICEPT) 10 MG tablet Take 10 mg by mouth at bedtime.    [provider]  memantine (NAMENDA) 10 MG tablet Take 10 mg by mouth 2 (two) times daily.    [provider]  mirtazapine (REMERON) 15 MG tablet Take 15 mg by mouth at bedtime.    [provider]  QUEtiapine (SEROQUEL) 25 MG tablet Take 37.5 mg by mouth at bedtime.    [provider]  Vitamin D, Ergocalciferol, (DRISDOL) 50000 UNITS CAPS capsule Take 50,000 Units by mouth every 7 (seven) days. On Wednesday    [provider]  witch hazel-glycerin (TUCKS) pad Apply 1 application topically 3 (three) times daily as needed for hemorrhoids.    [provider]    Family History Family History  Problem Relation Age of Onset  . Family history unknown: Yes    Social History Social History  Substance Use Topics  .  Smoking status: Never Smoker  . Smokeless tobacco: Never Used  . Alcohol use No     Allergies   Patient has no known allergies.   Review of Systems Review of Systems  Unable to perform ROS: Dementia  Constitutional: Positive for appetite change. Negative for fever.  Respiratory: Positive for cough.   Gastrointestinal: Negative for diarrhea and vomiting.  Genitourinary:       Malodorous urine  Neurological: Negative for syncope.  Hematological: Does not bruise/bleed easily.  Psychiatric/Behavioral: Positive for confusion.     Physical Exam Updated Vital Signs BP 136/80   Pulse 76   Temp (S) 99.1 F  (37.3 C) (Rectal)   Resp 17   SpO2 100%   Physical Exam  Constitutional: She appears well-developed and well-nourished.  Chronically ill-appearing female. Patient answering some yes/no questions, however not consistently.  HENT:  Head: Normocephalic and atraumatic.  Lips appear dry  Eyes: Pupils are equal, round, and reactive to light. Conjunctivae are normal. Right eye exhibits no discharge. Left eye exhibits no discharge.  EOM grossly normal  Neck: Normal range of motion. Neck supple. No tracheal deviation present.  Cardiovascular: Normal rate, regular rhythm, normal heart sounds and intact distal pulses.  Exam reveals no friction rub.   No murmur heard. Pulmonary/Chest: Effort normal and breath sounds normal. No stridor. No respiratory distress. She has no wheezes. She has no rales.  Abdominal: Soft. Bowel sounds are normal. She exhibits no distension and no mass. There is guarding (Unable to determine if this is voluntary or involuntary guarding as patient visibly tensing abdomen prior to palpation.). There is no rebound. No hernia.  Musculoskeletal: She exhibits no tenderness or deformity.  Moving bilateral upper extremities equally. Grasping for the sheet and gripping with both hands  Lymphadenopathy:    She has no cervical adenopathy.  Neurological: She is alert. She exhibits normal muscle tone.  Patient is alert, however not oriented.   Skin: Skin is warm.  2 necrotic decubitus ulcers to sacral region with surrounding erythema. Most distal ulcer probing to bone. See image.  Psychiatric: She has a normal mood and affect. Her behavior is normal.  Nursing note and vitals reviewed.      ED Treatments / Results  Labs (all labs ordered are listed, but only abnormal results are displayed) Labs Reviewed  URINALYSIS, ROUTINE W REFLEX MICROSCOPIC - Abnormal; Notable for the following:       Result Value   Protein, ur 30 (*)    Bacteria, UA RARE (*)    Squamous Epithelial / LPF  0-5 (*)    All other components within normal limits  COMPREHENSIVE METABOLIC PANEL - Abnormal; Notable for the following:    Potassium 3.3 (*)    Glucose, Bld 116 (*)    Albumin 1.9 (*)    All other components within normal limits  CBC WITH DIFFERENTIAL/PLATELET - Abnormal; Notable for the following:    WBC 14.6 (*)    RBC 3.26 (*)    Hemoglobin 9.2 (*)    HCT 29.2 (*)    RDW 17.2 (*)    Neutro Abs 12.8 (*)    All other components within normal limits  URINE CULTURE  CULTURE, BLOOD (ROUTINE X 2)  CULTURE, BLOOD (ROUTINE X 2)  I-STAT CG4 LACTIC ACID, ED    EKG  EKG Interpretation None       Radiology Dg Chest 2 View  Result Date: 01/19/2017 CLINICAL DATA:  Increased confusion over the past several days. EXAM: CHEST  2 VIEW COMPARISON:  06/22/2015 FINDINGS: Stable cardiomegaly with aortic atherosclerosis. No acute pneumonic consolidation, effusion or pneumothorax. No overt pulmonary edema. Osteoarthritis of both AC and glenohumeral joints with high-riding humeral heads consistent with chronic rotator cuff tears. IMPRESSION: 1. No active cardiopulmonary disease. 2. Stable cardiomegaly with aortic atherosclerosis. 3. Osteoarthritis of both shoulders. Electronically Signed   By: Ashley Royalty M.D.   On: 01/19/2017 22:19   Dg Sacrum/coccyx  Result Date: 01/19/2017 CLINICAL DATA:  Decubitus ulcer EXAM: SACRUM AND COCCYX - 2+ VIEW COMPARISON:  CT from 06/12/2012. FINDINGS: Sclerosis about both SI joints consistent with osteoarthritis. Surgical clips project over the mid pelvis. Soft tissue ulceration over the the dorsum of the mid sacrum. No conclusive evidence for acute osteomyelitis. Bones however are slightly demineralized in appearance limiting assessment. There is lower lumbar degenerative disc disease from L4 through S1. IMPRESSION: 1. Lower lumbar spondylosis with degenerative disc disease. 2. Decubitus ulcer overlying the dorsum of the mid sacrum without definite radiographic  evidence of acute osteomyelitis. Electronically Signed   By: Ashley Royalty M.D.   On: 01/19/2017 22:21    Procedures Procedures (including critical care time)  Medications Ordered in ED Medications  sodium chloride 0.9 % bolus 1,000 mL (0 mLs Intravenous Stopped 01/19/17 2227)  acetaminophen (TYLENOL) suppository 650 mg (650 mg Rectal Given 01/19/17 2142)     Initial Impression / Assessment and Plan / ED Course  I have reviewed the triage vital signs and the nursing notes.  Pertinent labs & imaging results that were available during my care of the patient were reviewed by me and considered in my medical decision making (see chart for details).     Patient with past medical history of Alzheimer's dementia, presenting with worsening confusion, decreased appetite and activity level. Patient febrile in ED at 100.86F. UA without signs of infection. CMP unremarkable. WBC elevated at 14.6. Lactate normal at 1.54. Blood cultures 2 pending. Urine culture pending. Chest x-ray negative for pneumonia or other source of infection. Patient with necrotic decubitus ulcers to sacrum probing to bone; x-ray not showing evidence of osteomyelitis however suspect this could be source of infection.  Fever improved with PR tylenol. Consult placed to hospitalist for admission. Pt likely requiring MRI to rule out osteomyelitis. Spoke with Dr. Roel Cluck, who is accepting admission.  Patient discussed with and seen by Dr. Leonette Monarch who guided treatment.  The patient appears reasonably stabilized for admission considering the current resources, flow, and capabilities available in the ED at this time, and I doubt any other [facility] requiring further screening and/or treatment in the ED prior to admission.  Final Clinical Impressions(s) / ED Diagnoses   Final diagnoses:  Fever of unknown origin  Decubitus ulcer of coccyx, stage IV ([place])  Acute encephalopathy    New Prescriptions New Prescriptions   No medications on file       Russo, [place] N, PA-C 01/19/17 2327    Fatima Blank, MD 01/20/17 [phone]

## 2017-01-19 NOTE — ED Triage Notes (Signed)
Brought in by EMS from home with c/o altered mental status.  Per pt's daughter, pt's has been having "increased confusion for the past few days"---- pt has dementia and normally confused.  Pt was seen by home health RN today and pt was observed to have "strong odor to her urine".

## 2017-01-20 DIAGNOSIS — F0281 Dementia in other diseases classified elsewhere with behavioral disturbance: Secondary | ICD-10-CM | POA: Diagnosis not present

## 2017-01-20 DIAGNOSIS — Z66 Do not resuscitate: Secondary | ICD-10-CM | POA: Diagnosis present

## 2017-01-20 DIAGNOSIS — L89154 Pressure ulcer of sacral region, stage 4: Secondary | ICD-10-CM | POA: Diagnosis not present

## 2017-01-20 DIAGNOSIS — A419 Sepsis, unspecified organism: Secondary | ICD-10-CM | POA: Diagnosis present

## 2017-01-20 DIAGNOSIS — I482 Chronic atrial fibrillation: Secondary | ICD-10-CM | POA: Diagnosis not present

## 2017-01-20 DIAGNOSIS — Z7982 Long term (current) use of aspirin: Secondary | ICD-10-CM | POA: Diagnosis not present

## 2017-01-20 DIAGNOSIS — G309 Alzheimer's disease, unspecified: Secondary | ICD-10-CM | POA: Diagnosis not present

## 2017-01-20 DIAGNOSIS — D509 Iron deficiency anemia, unspecified: Secondary | ICD-10-CM | POA: Diagnosis present

## 2017-01-20 DIAGNOSIS — I480 Paroxysmal atrial fibrillation: Secondary | ICD-10-CM | POA: Diagnosis present

## 2017-01-20 DIAGNOSIS — E43 Unspecified severe protein-calorie malnutrition: Secondary | ICD-10-CM | POA: Diagnosis present

## 2017-01-20 DIAGNOSIS — Z85038 Personal history of other malignant neoplasm of large intestine: Secondary | ICD-10-CM | POA: Diagnosis not present

## 2017-01-20 DIAGNOSIS — R627 Adult failure to thrive: Secondary | ICD-10-CM | POA: Diagnosis present

## 2017-01-20 DIAGNOSIS — Z7189 Other specified counseling: Secondary | ICD-10-CM

## 2017-01-20 DIAGNOSIS — L89221 Pressure ulcer of left hip, stage 1: Secondary | ICD-10-CM | POA: Diagnosis present

## 2017-01-20 DIAGNOSIS — Z515 Encounter for palliative care: Secondary | ICD-10-CM | POA: Diagnosis not present

## 2017-01-20 DIAGNOSIS — Z7401 Bed confinement status: Secondary | ICD-10-CM | POA: Diagnosis not present

## 2017-01-20 DIAGNOSIS — G9340 Encephalopathy, unspecified: Secondary | ICD-10-CM | POA: Diagnosis not present

## 2017-01-20 DIAGNOSIS — R1310 Dysphagia, unspecified: Secondary | ICD-10-CM | POA: Diagnosis present

## 2017-01-20 DIAGNOSIS — E860 Dehydration: Secondary | ICD-10-CM | POA: Diagnosis present

## 2017-01-20 DIAGNOSIS — E876 Hypokalemia: Secondary | ICD-10-CM | POA: Diagnosis present

## 2017-01-20 DIAGNOSIS — G9341 Metabolic encephalopathy: Secondary | ICD-10-CM | POA: Diagnosis present

## 2017-01-20 DIAGNOSIS — D6489 Other specified anemias: Secondary | ICD-10-CM | POA: Diagnosis present

## 2017-01-20 DIAGNOSIS — R509 Fever, unspecified: Secondary | ICD-10-CM | POA: Diagnosis present

## 2017-01-20 DIAGNOSIS — Z681 Body mass index (BMI) 19 or less, adult: Secondary | ICD-10-CM | POA: Diagnosis not present

## 2017-01-20 DIAGNOSIS — Z9181 History of falling: Secondary | ICD-10-CM | POA: Diagnosis not present

## 2017-01-20 LAB — CBC
HEMATOCRIT: 27.2 % — AB (ref 36.0–46.0)
HEMOGLOBIN: 8.5 g/dL — AB (ref 12.0–15.0)
MCH: 28.3 pg (ref 26.0–34.0)
MCHC: 31.3 g/dL (ref 30.0–36.0)
MCV: 90.7 fL (ref 78.0–100.0)
Platelets: 218 10*3/uL (ref 150–400)
RBC: 3 MIL/uL — ABNORMAL LOW (ref 3.87–5.11)
RDW: 17.2 % — AB (ref 11.5–15.5)
WBC: 15.8 10*3/uL — ABNORMAL HIGH (ref 4.0–10.5)

## 2017-01-20 LAB — BLOOD GAS, VENOUS
Acid-Base Excess: 2.2 mmol/L — ABNORMAL HIGH (ref 0.0–2.0)
Bicarbonate: 27.7 mmol/L (ref 20.0–28.0)
O2 Content: 2 L/min
O2 Saturation: 38.3 %
PCO2 VEN: 52.5 mmHg (ref 44.0–60.0)
PH VEN: 7.344 (ref 7.250–7.430)
Patient temperature: 99.1

## 2017-01-20 LAB — COMPREHENSIVE METABOLIC PANEL
ALBUMIN: 1.8 g/dL — AB (ref 3.5–5.0)
ALK PHOS: 76 U/L (ref 38–126)
ALT: 13 U/L — ABNORMAL LOW (ref 14–54)
ANION GAP: 8 (ref 5–15)
AST: 22 U/L (ref 15–41)
BILIRUBIN TOTAL: 1 mg/dL (ref 0.3–1.2)
BUN: 11 mg/dL (ref 6–20)
CALCIUM: 8.9 mg/dL (ref 8.9–10.3)
CO2: 24 mmol/L (ref 22–32)
Chloride: 109 mmol/L (ref 101–111)
Creatinine, Ser: 0.66 mg/dL (ref 0.44–1.00)
GFR calc Af Amer: >60 mL/min (ref >60)
GFR calc non Af Amer: >60 mL/min (ref >60)
GLUCOSE: 106 mg/dL — AB (ref 65–99)
POTASSIUM: 3.6 mmol/L (ref 3.5–5.1)
SODIUM: 141 mmol/L (ref 135–145)
Total Protein: 6.9 g/dL (ref 6.5–8.1)

## 2017-01-20 LAB — PREALBUMIN: Prealbumin: <5 mg/dL — ABNORMAL LOW (ref 18–38)

## 2017-01-20 LAB — MAGNESIUM: MAGNESIUM: 1.4 mg/dL — AB (ref 1.7–2.4)

## 2017-01-20 LAB — MRSA PCR SCREENING: MRSA by PCR: NEGATIVE

## 2017-01-20 LAB — PHOSPHORUS: Phosphorus: 2.9 mg/dL (ref 2.5–4.6)

## 2017-01-20 LAB — LACTIC ACID, PLASMA
LACTIC ACID, VENOUS: 2.3 mmol/L — AB (ref 0.5–1.9)
Lactic Acid, Venous: 2 mmol/L (ref 0.5–1.9)
Lactic Acid, Venous: 1.7 mmol/L (ref 0.5–1.9)

## 2017-01-20 LAB — PROCALCITONIN: Procalcitonin: 0.36 ng/mL

## 2017-01-20 LAB — TSH: TSH: 0.807 u[IU]/mL (ref 0.350–4.500)

## 2017-01-20 MED ORDER — SODIUM CHLORIDE 0.9 % IV BOLUS (SEPSIS)
1000.0000 mL | Freq: Once | INTRAVENOUS | Status: AC
  Administered 2017-01-20: 1000 mL via INTRAVENOUS

## 2017-01-20 MED ORDER — DONEPEZIL HCL 10 MG PO TABS
10.0000 mg | ORAL_TABLET | Freq: Every day | ORAL | Status: DC
  Administered 2017-01-20 – 2017-01-27 (×8): 10 mg via ORAL
  Filled 2017-01-20 (×8): qty 1

## 2017-01-20 MED ORDER — BISACODYL 10 MG RE SUPP: 10.0000 mg | Freq: Every day | RECTAL | Status: DC | PRN

## 2017-01-20 MED ORDER — ORAL CARE MOUTH RINSE
15.0000 mL | Freq: Two times a day (BID) | OROMUCOSAL | Status: DC
  Administered 2017-01-21 – 2017-01-28 (×11): 15 mL via OROMUCOSAL

## 2017-01-20 MED ORDER — ONDANSETRON HCL 4 MG/2ML IJ SOLN: 4.0000 mg | Freq: Four times a day (QID) | INTRAMUSCULAR | Status: DC | PRN

## 2017-01-20 MED ORDER — POLYETHYLENE GLYCOL 3350 17 G PO PACK: 17.0000 g | PACK | Freq: Every day | ORAL | Status: DC | PRN

## 2017-01-20 MED ORDER — SODIUM CHLORIDE 0.9 % IV SOLN
500.0000 mg | Freq: Two times a day (BID) | INTRAVENOUS | Status: DC
  Administered 2017-01-20 – 2017-01-23 (×6): 500 mg via INTRAVENOUS
  Filled 2017-01-20 (×6): qty 500

## 2017-01-20 MED ORDER — ASPIRIN EC 81 MG PO TBEC: 81.0000 mg | DELAYED_RELEASE_TABLET | Freq: Every day | ORAL | Status: DC | PRN

## 2017-01-20 MED ORDER — QUETIAPINE FUMARATE 25 MG PO TABS
37.5000 mg | ORAL_TABLET | Freq: Every day | ORAL | Status: DC
  Administered 2017-01-21 – 2017-01-27 (×7): 37.5 mg via ORAL
  Filled 2017-01-20 (×7): qty 2

## 2017-01-20 MED ORDER — ONDANSETRON HCL 4 MG PO TABS
4.0000 mg | ORAL_TABLET | Freq: Four times a day (QID) | ORAL | Status: DC | PRN
Start: 2017-01-20 — End: 2017-01-28

## 2017-01-20 MED ORDER — DEXTROSE 5 % IV SOLN
1.0000 g | Freq: Two times a day (BID) | INTRAVENOUS | Status: DC
  Administered 2017-01-20 – 2017-01-24 (×8): 1 g via INTRAVENOUS
  Filled 2017-01-20 (×9): qty 1

## 2017-01-20 MED ORDER — FOLIC ACID 1 MG PO TABS
1.0000 mg | ORAL_TABLET | Freq: Every day | ORAL | Status: DC
  Administered 2017-01-20 – 2017-01-28 (×9): 1 mg via ORAL
  Filled 2017-01-20 (×9): qty 1

## 2017-01-20 MED ORDER — MEMANTINE HCL 10 MG PO TABS
10.0000 mg | ORAL_TABLET | Freq: Two times a day (BID) | ORAL | Status: DC
  Administered 2017-01-20 – 2017-01-28 (×17): 10 mg via ORAL
  Filled 2017-01-20 (×17): qty 1

## 2017-01-20 MED ORDER — ACETAMINOPHEN 650 MG RE SUPP: 650.0000 mg | Freq: Four times a day (QID) | RECTAL | Status: DC | PRN

## 2017-01-20 MED ORDER — METRONIDAZOLE IN NACL 5-0.79 MG/ML-% IV SOLN
500.0000 mg | Freq: Three times a day (TID) | INTRAVENOUS | Status: DC
  Filled 2017-01-20 (×2): qty 100

## 2017-01-20 MED ORDER — ENOXAPARIN SODIUM 40 MG/0.4ML ~~LOC~~ SOLN
40.0000 mg | SUBCUTANEOUS | Status: DC
  Administered 2017-01-20 – 2017-01-21 (×2): 40 mg via SUBCUTANEOUS
  Filled 2017-01-20 (×2): qty 0.4

## 2017-01-20 MED ORDER — METRONIDAZOLE IN NACL 5-0.79 MG/ML-% IV SOLN
500.0000 mg | Freq: Once | INTRAVENOUS | Status: AC
  Administered 2017-01-20: 500 mg via INTRAVENOUS
  Filled 2017-01-20: qty 100

## 2017-01-20 MED ORDER — ACETAMINOPHEN 325 MG PO TABS
650.0000 mg | ORAL_TABLET | Freq: Four times a day (QID) | ORAL | Status: DC | PRN
  Administered 2017-01-21: 650 mg via ORAL
  Filled 2017-01-20: qty 2

## 2017-01-20 MED ORDER — KCL IN DEXTROSE-NACL 10-5-0.45 MEQ/L-%-% IV SOLN
INTRAVENOUS | Status: DC
  Administered 2017-01-21 – 2017-01-22 (×2): via INTRAVENOUS
  Filled 2017-01-20 (×3): qty 1000

## 2017-01-20 MED ORDER — POTASSIUM CHLORIDE 10 MEQ/100ML IV SOLN
10.0000 meq | INTRAVENOUS | Status: AC
  Administered 2017-01-20 (×2): 10 meq via INTRAVENOUS
  Filled 2017-01-20 (×2): qty 100

## 2017-01-20 MED ORDER — AZTREONAM IN DEXTROSE 2 GM/50ML IV SOLN
2.0000 g | Freq: Three times a day (TID) | INTRAVENOUS | Status: DC
  Filled 2017-01-20 (×2): qty 50

## 2017-01-20 MED ORDER — DEXTROSE 5 % IV SOLN
2.0000 g | Freq: Once | INTRAVENOUS | Status: AC
  Administered 2017-01-20: 2 g via INTRAVENOUS
  Filled 2017-01-20: qty 2

## 2017-01-20 MED ORDER — DEXTROSE 5 % IV SOLN
2.0000 g | Freq: Three times a day (TID) | INTRAVENOUS | Status: DC
  Filled 2017-01-20: qty 2

## 2017-01-20 MED ORDER — VITAMIN B-1 100 MG PO TABS
100.0000 mg | ORAL_TABLET | Freq: Every day | ORAL | Status: DC
  Administered 2017-01-20 – 2017-01-28 (×9): 100 mg via ORAL
  Filled 2017-01-20 (×9): qty 1

## 2017-01-20 MED ORDER — SODIUM CHLORIDE 0.9 % IV SOLN
INTRAVENOUS | Status: AC
  Administered 2017-01-20: 05:00:00 via INTRAVENOUS

## 2017-01-20 MED ORDER — VANCOMYCIN HCL IN DEXTROSE 1-5 GM/200ML-% IV SOLN
1000.0000 mg | Freq: Once | INTRAVENOUS | Status: AC
  Administered 2017-01-20: 1000 mg via INTRAVENOUS
  Filled 2017-01-20: qty 200

## 2017-01-20 NOTE — Progress Notes (Signed)
Pharmacy Antibiotic Note  Kathleen [name] is a 83 y.o. female with increased confusion and decreased oral intake admitted on 01/19/2017 with sepsis. Likely secondary to decubitus ulcer Pharmacy has been consulted for azactam and vancomycin dosing.  Plan: Azactam 2 Gm IV q8h Vancomycin 1 Gm x1 then 500 mg IV q12h VT=15-20 mg/L Flagyl 500 mg IV q8h Consider changing azactam to cephalosprin as pt has tolerated rocephin in past F/u scr/cultures/levels     Temp (24hrs), Avg:99 F (37.2 C), Min:97.6 F (36.4 C), Max:100.8 F (38.2 C)   Recent Labs Lab 01/19/17 2011 01/19/17 2106  WBC 14.6*  --   CREATININE 0.76  --   LATICACIDVEN  --  1.54    CrCl cannot be calculated (Unknown ideal weight.).    Allergies  Allergen Reactions  . Penicillins Other (See Comments)    Pt family and pt does not know the reaction.    Antimicrobials this admission: 8/26 azactam >>  8/26 flagyl >>  8/26 vancomycin >>  Dose adjustments this admission:   Microbiology results:  BCx:   UCx:    Sputum:    MRSA PCR:   Thank you for allowing pharmacy to be a part of this patient's care.  Dorrene German 01/20/2017 3:41 AM

## 2017-01-20 NOTE — Consult Note (Signed)
[facility] Nurse wound consult note Reason for Consult: Sacral pressure injury, Unstageable, deep tissue pressure injury. Also noted in right medial heel deep tissue pressure injury. Skin changes at end life's end (SCALE) should also be considered as etiology in addition to malnourishment and pressure. Wound type: Pressure Pressure Injury POA: Yes Measurement: Sacral:  9cm x 3cm purple/maroon area of fluctuant, discolored tissue with strong odor consistent with necrotic tissue. Distal 3cm is separating slightly.  Right medial heel:  3cm x 4cm area of non-blanching erythema with white discoloration in center. Wound bed:AS described above Drainage (amount, consistency, odor) None Periwound: Skin with poor turgor, intact, dry Dressing procedure/placement/frequency: I will provide a mattress replacement with low air loss feature for comfort and pressure redistribution. Additionally, a betadine swabstick application to both the heel and sacral area for antimicrobial and drying properties is initiated. If you desire, a surgical consult (CCS) could be considered to debride the sacral area, but I do not believe that would change the outcome of tissue destruction and loss.  If you desire consultation with CCS MD, please order/arrange. [facility] nursing team will not follow, but will remain available to this patient, the nursing and medical teams.  Please re-consult if needed. Thanks, Kathleen Flakes, MSN, RN, Kathleen [name], Kathleen [name]  Pager# [phone]

## 2017-01-20 NOTE — Progress Notes (Signed)
PT Note  Patient Details Name: Kathleen [name] MRN: [id number] DOB: 09-13-1925    Met with patient's daughter and spoke to her first about the patient's prior ability and if she would like for me to continue to attempt to help her mother move from a PT standpoint. At this time, she agreed with my assessment. I did have patient sitt EOB with Max assist, and attemt to stand with Max A of 2 people and B hand held. Pt had retropulsion and stood for about 10 seconds before sitting back down.   Spoke with family after as well, they would at this point like for [place] to continue to see if patient would be able to stand and walk.   Full write up to follow.    Clide Dales 01/20/2017, 6:04 PM

## 2017-01-20 NOTE — Consult Note (Signed)
Consultation Note Date: 01/20/2017   Patient Name: Kathleen [name]  DOB: [birth year]  MRN: [id number]  Age / Sex: 83 y.o., female  PCP: Jani Gravel, MD Referring Physician: Janece Canterbury, MD  Reason for Consultation: Establishing goals of care  HPI/Patient Profile: 83 y.o. female   admitted on 01/19/2017     Clinical Assessment and Goals of Care:  Patient is a 83 year old lady with a past medical history significant for advanced Alzheimer's dementia who lives at home with her daughter. She essentially has 24 7 Homecare. It is noted that for the past 2-3 weeks or so the patient has had diminishing oral intake has become more bedbound. She has underlying atrial fibrillation. She has been admitted with encephalopathy and stage IV decubitus ulcer with serious concern for possible osteomyelitis. She remains admitted with broad-spectrum IV antibiotics Azactam and Flagyl. Her serum albumin is 1.8 g/dL. She has leukocytosis 15.8. There is concern for sepsis possible osteomyelitis. A palliative consult has been requested for goals of care discussions.  The patient is resting in bed. She appears thin and weak. She is lying on one side. She opens her eyes but does not engage. Patient's daughter Kathleen [name] is present at the bedside.  Up until the past 2-3 weeks, daughter states that the patient was eating and the patient was walking around the house slowly and short steps. She hated to use a walker or a cane. She was originally diagnosed with Alzheimer's about 15 years ago and has been on medications for that since then. Patient's daughter Kathleen [name] works as a Catering manager and has 24 7 care arranged for the patient when she is not at home. Patient has another daughter who lives in [place].  I discussed in detail with the patient's daughter Kathleen [name] about the patient's underlying comorbidities as well as the current nature of this  patient's hospitalization. Discussed dementia disease trajectory in detail. Issues pertaining to artificial nutrition and hydration and advanced dementia discussed in detail. Issues pertaining to prognosis and end of life wishes discussed in detail. Patient's daughter is tearful but is fully aware of the possibility of the patient not having a significant recovery. See discussions and recommendations below. Thank you for the consult. We will continue to follow along and help guide appropriate decision-making and appropriate disposition arrangements.  HCPOA  daughter Kathleen [name] [phone].   SUMMARY OF RECOMMENDATIONS    1. DNR DNI re discussed and re confirmed with daughter HCPOA agent Kathleen [name].  2. Continue current mode of care for the next 24-48 hours, if the patient does not have significant recovery, we have discussed about focusing exclusively on comfort measures and either residential hospice or home with hospice.  3. Monitor for non verbal gestures of distress or discomfort, use appropriate prns.   Thank you for the consult.   Code Status/Advance Care Planning:  DNR    Symptom Management:      Palliative Prophylaxis:   Delirium Protocol  Psycho-social/Spiritual:   Desire for further Chaplaincy support:yes  Additional Recommendations:  Education on Hospice  Prognosis:   Guarded, could be less than 2-3 weeks, this has been discussed frankly and compassionately with daughter HCPOA agent Kathleen [name].   Discharge Planning:  Pending, could be residential hospice versus home with hospice, based on hospital course.       Primary Diagnoses: Present on Admission: . Atrial fibrillation ([place]) . Acute encephalopathy . Dementia of Alzheimer's type with behavioral disturbance . History of colon cancer . Sacral decubitus ulcer . Sepsis ([place]) . Hypokalemia . Hypoalbuminemia   I have reviewed the medical record, interviewed the patient and family, and examined the  patient. The following aspects are pertinent.  Past Medical History:  Diagnosis Date  . A-fib ([place])   . Alzheimer's dementia   . Cancer colon    Social History   Social History  . Marital status: Widowed    Spouse name: N/A  . Number of children: N/A  . Years of education: N/A   Social History Main Topics  . Smoking status: Never Smoker  . Smokeless tobacco: Never Used  . Alcohol use No  . Drug use: No  . Sexual activity: No   Other Topics Concern  . None   Social History Narrative  . None   Family History  Problem Relation Age of Onset  . Family history unknown: Yes   Scheduled Meds: . donepezil  10 mg Oral QHS  . enoxaparin (LOVENOX) injection  40 mg Subcutaneous Q24H  . folic acid  1 mg Oral Daily  . memantine  10 mg Oral BID  . [START ON 01/21/2017] QUEtiapine  37.5 mg Oral QHS  . thiamine  100 mg Oral Daily   Continuous Infusions: . sodium chloride 100 mL/hr at 01/20/17 0437  . aztreonam    . metronidazole    . vancomycin     PRN Meds:.acetaminophen **OR** acetaminophen, aspirin EC, bisacodyl, ondansetron **OR** ondansetron (ZOFRAN) IV, polyethylene glycol Medications Prior to Admission:  Prior to Admission medications   Medication Sig Start Date End Date Taking? Authorizing Provider  ALPRAZolam Duanne Moron) 1 MG tablet Take 0.5-1 mg by mouth 2 (two) times daily. 0.5 tablet in the am and 1mg  at bedtime   Yes [provider]  aspirin EC 81 MG tablet Take 81 mg by mouth daily as needed for mild pain (hemorrhoids).   Yes [provider]  Cyanocobalamin (VITAMIN B-12 CR PO) Take 1 tablet by mouth daily.   Yes [provider]  donepezil (ARICEPT) 10 MG tablet Take 10 mg by mouth at bedtime.   Yes [provider]  memantine (NAMENDA) 10 MG tablet Take 10 mg by mouth 2 (two) times daily.   Yes [provider]  QUEtiapine (SEROQUEL) 25 MG tablet Take 37.5 mg by mouth at bedtime.   Yes [provider]  Vitamin D,  Ergocalciferol, (DRISDOL) 50000 UNITS CAPS capsule Take 50,000 Units by mouth every 7 (seven) days. On Wednesday   Yes [provider]  diltiazem (CARDIZEM CD) 120 MG 24 hr capsule Take 1 capsule (120 mg total) by mouth daily. Patient not taking: Reported on 01/19/2017 06/13/13   Barton Dubois, MD   Allergies  Allergen Reactions  . Penicillins Other (See Comments)    Pt family and pt does not know the reaction.   Review of Systems Non verbal.   Physical Exam Cachectic elderly lady lying in bed Non verbal with me Shallow regular breathing S1 S2 Chart reviewed, noted to have dry sacral decub, present on admission, mal odorous discharge  noted  Thin extremities Opens eyes, does not verbalize  Vital Signs: BP (!) 142/72 (BP Location: Right Arm)   Pulse 73   Temp 97.6 F (36.4 C) (Oral)   Resp (!) 22   Ht 5\' 9"  (1.753 m)   Wt 56 kg (123 lb 7.3 oz)   SpO2 95%   BMI 18.23 kg/m  Pain Assessment: PAINAD       SpO2: SpO2: 95 % O2 Device:SpO2: 95 % O2 Flow Rate: .O2 Flow Rate (L/min): 2 L/min  IO: Intake/output summary:  Intake/Output Summary (Last 24 hours) at 01/20/17 1148 Last data filed at 01/19/17 2227  Gross per 24 hour  Intake             1000 ml  Output                0 ml  Net             1000 ml    LBM: Last BM Date:  (pt is unable to answer questions appropriately) Baseline Weight: Weight: 56 kg (123 lb 7.3 oz) Most recent weight: Weight: 56 kg (123 lb 7.3 oz)     Palliative Assessment/Data:   Flowsheet Rows     Most Recent Value  Intake Tab  Referral Department  Hospitalist  Unit at Time of Referral  Med/Surg Unit  Palliative Care Primary Diagnosis  Other (Comment) [dementia, sacral decub, decline. ]  Palliative Care Type  New Palliative care  Reason for referral  Clarify Goals of Care, Counsel Regarding Hospice  Date first seen by Palliative Care  01/20/17  Clinical Assessment  Palliative Performance Scale Score  20%  Pain Max last 24 hours   4  Pain Min Last 24 hours  3  Dyspnea Max Last 24 Hours  3  Dyspnea Min Last 24 hours  2  Nausea Max Last 24 Hours  3  Nausea Min Last 24 Hours  2  Anxiety Max Last 24 Hours  3  Anxiety Min Last 24 Hours  2  Psychosocial & Spiritual Assessment  Palliative Care Outcomes  Patient/Family meeting held?  Yes  Who was at the meeting?  daughter Carrine Kroboth [facility] agent.   Palliative Care Outcomes  Clarified goals of care      Time In:10 am  Time Out: 11.10 Time Total: 70 min Greater than 50%  of this time was spent counseling and coordinating care related to the above assessment and plan.  Signed by: Loistine Chance, MD  [phone]  Please contact Palliative Medicine Team phone at [phone] for questions and concerns.  For individual provider: See Shea Evans

## 2017-01-20 NOTE — Progress Notes (Signed)
Pharmacy Antibiotic Note  Kathleen [name] is a 83 y.o. female with increased confusion and decreased oral intake admitted on 01/19/2017 with sepsis. Likely secondary to decubitus ulcer Pharmacy has been consulted for azactam and vancomycin dosing.  Plan: Start Cefepime 1g IV q12h Vancomycin 1 Gm x1 then 500 mg IV q12h VT=15-20 mg/L F/u scr/cultures/levels  Height: 5\' 9"  (175.3 cm) Weight: 123 lb 7.3 oz (56 kg) IBW/kg (Calculated) : 66.2  Temp (24hrs), Avg:99 F (37.2 C), Min:97.6 F (36.4 C), Max:100.8 F (38.2 C)   Recent Labs Lab 01/19/17 2011 01/19/17 2106 01/20/17 0814 01/20/17 1126  WBC 14.6*  --  15.8*  --   CREATININE 0.76  --  0.66  --   LATICACIDVEN  --  1.54 2.0* 2.3*    Estimated Creatinine Clearance: 42.1 mL/min (by C-G formula based on SCr of 0.66 mg/dL).    Allergies  Allergen Reactions  . Penicillins Other (See Comments)    Pt family and pt does not know the reaction.    Antimicrobials this admission: 8/26 azactam >> 8/26 8/26 flagyl >>  8/26 vancomycin >> 8/26 cefepime >>  Dose adjustments this admission:   Microbiology results:  8/25 BCx:   8/25 UCx:    8/26 MRSA PCR:   Thank you for allowing pharmacy to be a part of this patient's care.  Romeo Rabon, PharmD, pager [phone]. 01/20/2017,12:54 PM.

## 2017-01-20 NOTE — Evaluation (Signed)
Physical Therapy Evaluation Patient Details Name: Kathleen [name] MRN: [id number] DOB: [birth year] Today's Date: 01/20/2017   History of Present Illness  The patient is an 83 year old female with history of dementia with behavioral disturbance, atrial fibrillation, recurrent UTI and colon cancer who presents with increased confusion, refusal to eat, strong odor of urine and strong odor from her sacral decubitus ulcer. She has been bedbound for several weeks now.  She was found to be septic with fever, tachycardia, altered mentation. She was started on broad-spectrum antibiotics for suspected sacral decubitus ulcer infection  Clinical Impression  Pt with weakness and change in her ability to perform functional ability at this time. We will continue to follow pt while on acute care.     Follow Up Recommendations SNF (or home if family continues to want home and provide 24/7 care )    Equipment Recommendations  Rolling walker with 5" wheels;Hospital bed (may need hospital bed if return home))    Recommendations for Other Services       Precautions / Restrictions Precautions Precautions: Other (comment) Precaution Comments: sacral decubitus that is very painful for the patient Restrictions Weight Bearing Restrictions: No      Mobility  Bed Mobility Overal bed mobility: Needs Assistance Bed Mobility: Supine to Sit;Sit to Supine     Supine to sit: Max assist Sit to supine: Max assist;+2 for physical assistance   General bed mobility comments: little restistive due to the nature of her alzheimers disease as well and with retropulsion with movement. (stiff) did get her to EOB and slightly relaxed for about 3 miuntes, just very painful for her to be in this position so she wanted to lean to the side.   Transfers Overall transfer level: Needs assistance Equipment used: 2 person hand held assist Transfers: Sit to/from Stand Sit to Stand: +2 physical assistance;Max assist          General transfer comment: stood for about 10 seconds with retropulsion (leaning backwards ) and never achieved full upright before sitting back down.   Ambulation/Gait Ambulation/Gait assistance:  (unable to at this time )              Financial trader Rankin (Stroke Patients Only)       Balance Overall balance assessment: Needs assistance   Sitting balance-Leahy Scale: Poor                                       Pertinent Vitals/Pain Pain Assessment: Faces Faces Pain Scale: Hurts whole lot Pain Location: when moving and especially sitting patient with pain pointing and stating in the front and in the back (bottom /sacral) area.  Pain Descriptors / Indicators: Burning;Sharp Pain Intervention(s): Limited activity within patient's tolerance;Monitored during session;Repositioned    Home Living Family/patient expects to be discharged to:: Private residence Living Arrangements: Children Available Help at Discharge: Available 24 hours/day (her dtr assists and also has caregivers ) Type of Home: House Home Access: Stairs to enter   [employer] of Steps: 4 Home Layout: One level Home Equipment: [place] - 4 wheels;Shower seat;Hand held shower head      Prior Function Level of Independence: Needs assistance (dtr states caregivers 24/7. up to about 3 weeks ago, she would walk around nt eh house guided by hand held assist, and move about. she  was able to get her on the shower bench and bath her, lately caregivers have been putting her in the whirlpool tub)               Hand Dominance        Extremity/Trunk Assessment        Lower Extremity Assessment Lower Extremity Assessment: Generalized weakness       Communication   Communication: No difficulties (she did communicate with a few words reponsive to my questions and communication today. )  Cognition Arousal/Alertness: Awake/alert Behavior  During Therapy: WFL for tasks assessed/performed Overall Cognitive Status: Impaired/Different from baseline                                        General Comments      Exercises     Assessment/Plan    PT Assessment Patient needs continued PT services (depending on family's wishes and palliative discussions as well. )  PT Problem List Decreased strength;Decreased activity tolerance;Decreased mobility       PT Treatment Interventions Functional mobility training;Gait training;Therapeutic activities    PT Goals (Current goals can be found in the Care Plan section)  Acute Rehab PT Goals Patient Stated Goal: Dtr wanted [place] to try the assessment to see what her mother is still able to do  PT Goal Formulation: Patient unable to participate in goal setting    Frequency Min 3X/week   Barriers to discharge        Co-evaluation               AM-PAC PT "6 Clicks" Daily Activity  Outcome Measure Difficulty turning over in bed (including adjusting bedclothes, sheets and blankets)?: Unable Difficulty moving from lying on back to sitting on the side of the bed? : Unable Difficulty sitting down on and standing up from a chair with arms (e.g., wheelchair, bedside commode, etc,.)?: Unable Help needed moving to and from a bed to chair (including a wheelchair)?: A Lot Help needed walking in hospital room?: Total Help needed climbing 3-5 steps with a railing? : Total 6 Click Score: 7    End of Session   Activity Tolerance: Patient limited by pain;Treatment limited secondary to medical complications (Comment) Patient left: in bed;with call bell/phone within reach;with nursing/sitter in room (bursing was going to do a dressing change and clean patient up. ) Nurse Communication: Mobility status PT Visit Diagnosis: Unsteadiness on feet (R26.81);Other abnormalities of gait and mobility (R26.89)    Time:  -      Charges:         PT G Codes:        Clide Dales,  PT Pager: [phone] 01/20/2017   Margaret Cockerill, Gatha Mayer 01/20/2017, 9:45 PM

## 2017-01-20 NOTE — Progress Notes (Signed)
CRITICAL VALUE ALERT  Critical Value:  Lactic acid 2.0  Date & Time Notied:  01/20/17 0913  Provider Notified: M. Short  Orders Received/Actions taken: 1L NS bolus

## 2017-01-20 NOTE — Progress Notes (Signed)
RN may call Threasa Beards for report at 01:15 [phone]

## 2017-01-20 NOTE — Evaluation (Signed)
Clinical/Bedside Swallow Evaluation Patient Details  Name: Kathleen [name] MRN: [id number] Date of Birth: March 28, 1926  Today's Date: 01/20/2017 Time: SLP Start Time (ACUTE ONLY): 60 SLP Stop Time (ACUTE ONLY): 1419 SLP Time Calculation (min) (ACUTE ONLY): 18 min  Past Medical History:  Past Medical History:  Diagnosis Date  . A-fib ([place])   . Alzheimer's dementia   . Cancer colon    Past Surgical History:  Past Surgical History:  Procedure Laterality Date  . APPENDECTOMY    . CHOLECYSTECTOMY    . COLON SURGERY    . TONSILLECTOMY     HPI:  Kathleen [name] a 83 y.o.femalewith medical history significant of dementia (with behavioral disturbances), atrial fibrillation and hx of colon cancer who was admitted with increased confusion and decreased oral intakeadmittedon 8/25/2018with sepsis. Likely secondary to decubitus ulcer. Chest x ray without active cardiopulmonary disease   Assessment / Plan / Recommendation Clinical Impression  Pt presents with a suspected mild oropharyngeal dysphagia which is impacted by her advanced cognitive decline. Pt alert during BSE and pleasantly confused. SLP obtained swallow hx from daughter who was present at the end of session. Baseline diet prior to hospitalization was regular consistencies and thin liquids. Pts positioning this date was partially reclined secondary to grimacing and pain reported at sacral wound site with attempts at fully upright positioning. Despite pt being partially reclined, pt without overt s/sx of aspiration with any consistencies. Note prolonged mastication with solid PO. Intake suspected to be negatively impacted by pts dementia with cognitive deficits as oral readiness and acceptance noted to be variable. Recommend dysphagia 2 (fine chopped) and thin liquids with medicines crushed in puree and full supervision/feeding assistance to maximze nutrition and hydration and decrease aspiration risk. SLP to continue to monitor.  SLP  Visit Diagnosis: Dysphagia, unspecified (R13.10)    Aspiration Risk  Mild aspiration risk    Diet Recommendation   Dysphagia 2 (fine chopped) thin liquids   Medication Administration: Crushed with puree    Other  Recommendations Oral Care Recommendations: Oral care BID   Follow up Recommendations 24 hour supervision/assistance      Frequency and Duration min 1 x/week  1 week       Prognosis Prognosis for Safe Diet Advancement: Guarded Barriers to Reach Goals: Severity of deficits      Swallow Study   General Date of Onset: 07/37/10 HPI: Lavender A [name] a 83 y.o.femalewith medical history significant of dementia (with behavioral disturbances), atrial fibrillation and hx of colon cancer who was admitted with increased confusion and decreased oral intakeadmittedon 8/25/2018with sepsis. Likely secondary to decubitus ulcer. Chest x ray without active cardiopulmonary disease Type of Study: Bedside Swallow Evaluation Previous Swallow Assessment: none on file Diet Prior to this Study: Dysphagia 1 (puree);Thin liquids Temperature Spikes Noted: No Respiratory Status: Nasal cannula History of Recent Intubation: No Behavior/Cognition: Alert;Confused;Pleasant mood Oral Cavity Assessment: Dry Oral Cavity - Dentition: Adequate natural dentition Self-Feeding Abilities: Needs assist Patient Positioning: Partially reclined (secondary to pain with fully upright positioning) Baseline Vocal Quality: Low vocal intensity Volitional Cough: Cognitively unable to elicit    Oral/Motor/Sensory Function Overall Oral Motor/Sensory Function: Generalized oral weakness   Ice Chips Ice chips: Not tested   Thin Liquid Thin Liquid: Within functional limits    Nectar Thick Nectar Thick Liquid: Not tested   Honey Thick Honey Thick Liquid: Not tested   Puree Puree: Within functional limits   Solid   GO   Solid: Impaired Presentation: Spoon Oral Phase Impairments: Reduced  lingual  movement/coordination;Impaired mastication Oral Phase Functional Implications: Prolonged oral transit;Impaired mastication Pharyngeal Phase Impairments: Suspected delayed Swallow;Multiple swallows       Arvil Chaco MA, CCC-SLP Acute Care Speech Language Pathologist    Levi Aland 01/20/2017,2:24 PM

## 2017-01-20 NOTE — Progress Notes (Addendum)
PROGRESS NOTE  Kathleen [name]  ALP:[id number] DOB: Oct 10, 1925 DOA: 01/19/2017 PCP: Jani Gravel, MD  Brief Narrative:   The patient is an 83 year old female with history of dementia with behavioral disturbance, atrial fibrillation, recurrent UTI and colon cancer who presents with increased confusion, refusal to eat, strong odor of urine and strong odor from her sacral decubitus ulcer. She has been bedbound for several weeks now.  She was found to be septic with fever, tachycardia, altered mentation. She was started on broad-spectrum antibiotics for suspected sacral decubitus ulcer infection.    Assessment & Plan:   Active Problems:   History of colon cancer   Acute encephalopathy   Atrial fibrillation (HCC)   Dementia of Alzheimer's type with behavioral disturbance   Sacral decubitus ulcer   Sepsis ([place])   Hypokalemia   Hypoalbuminemia   Decubitus ulcer of coccyx, stage IV ([place])   Encounter for palliative care   Goals of care, counseling/discussion  Sepsis secondary to infection of her sacral decubitus ulcer.  Her chest x-ray and urinalysis were unremarkable. She has foul odor with drainage of her sacral ulcer.  X-ray did not demonstrate evidence of acute osteomyelitis sacrum. -  Lactic acid elevated -  Normal saline boluses, IV fluids -  Repeat lactic acid again in another 3 hours -  Continue vancomycin -  Check MRSA PCR -  Discontinue aztreonam and Flagyl -  Start cefepime -  Wound care consult -  Palliative care consult appreciated  Metabolic encephalopathy, able to speak a few words to me today -  Continue IV fluids and antibiotics -  Aspiration precautions and speech therapy evaluation, appreciate assistance -  Dysphagia 2 with thin   Dementia with behavioral disturbance -  Continue namenda, seroquel, and aricept  Paroxysmal atrial fibrillation, no longer on anticoagulation due to falls risk - continue aspirin -  NSR on telemetry  History of colon cancer,  stable  Probable protein calorie malnutrition -  Nutrition consultation -  supplements   DVT prophylaxis:  Lovenox Code Status:  DNR/DO NOT INTUBATE Family Communication:  Attempted to call family but was unable to reach.   Disposition Plan:  Likely to SNF in a few days   Consultants:   Palliative care  Wound care  Procedures:   None  Antimicrobials:  Anti-infectives    Start     Dose/Rate Route Frequency Ordered Stop   01/20/17 2200  vancomycin (VANCOCIN) 500 mg in sodium chloride 0.9 % 100 mL IVPB     500 mg 100 mL/hr over 60 Minutes Intravenous Every 12 hours 01/20/17 0638     01/20/17 1400  metroNIDAZOLE (FLAGYL) IVPB 500 mg  Status:  Discontinued     500 mg 100 mL/hr over 60 Minutes Intravenous Every 8 hours 01/20/17 0510 01/20/17 1244   01/20/17 1400  aztreonam (AZACTAM) 2 g in dextrose 5 % 50 mL IVPB  Status:  Discontinued     2 g 100 mL/hr over 30 Minutes Intravenous Every 8 hours 01/20/17 0542 01/20/17 0638   01/20/17 1400  aztreonam (AZACTAM) 2 GM IVPB  Status:  Discontinued     2 g 100 mL/hr over 30 Minutes Intravenous Every 8 hours 01/20/17 0638 01/20/17 1244   01/20/17 1400  ceFEPIme (MAXIPIME) 1 g in dextrose 5 % 50 mL IVPB     1 g 100 mL/hr over 30 Minutes Intravenous Every 12 hours 01/20/17 1257     01/20/17 0600  vancomycin (VANCOCIN) IVPB 1000 mg/200 mL premix  1,000 mg 200 mL/hr over 60 Minutes Intravenous  Once 01/20/17 0335 01/20/17 1012   01/20/17 0345  aztreonam (AZACTAM) 2 g in dextrose 5 % 50 mL IVPB     2 g 100 mL/hr over 30 Minutes Intravenous  Once 01/20/17 0335 01/20/17 0508   01/20/17 0345  metroNIDAZOLE (FLAGYL) IVPB 500 mg     500 mg 100 mL/hr over 60 Minutes Intravenous  Once 01/20/17 0335 01/20/17 0903       Subjective:  Patient confused, unable to provide history. History of dementia  Objective: Vitals:   01/20/17 0100 01/20/17 0145 01/20/17 0331 01/20/17 1330  BP: (!) 110/58  (!) 142/72 (!) 114/47  Pulse: 65 61 73 67   Resp: 19 12 (!) 22   Temp:   97.6 F (36.4 C)   TempSrc:   Oral Other (Comment)  SpO2: 100% 100% 95% 100%  Weight:   56 kg (123 lb 7.3 oz)   Height:   5\' 9"  (1.753 m)     Intake/Output Summary (Last 24 hours) at 01/20/17 1507 Last data filed at 01/19/17 2227  Gross per 24 hour  Intake             1000 ml  Output                0 ml  Net             1000 ml   Filed Weights   01/20/17 0331  Weight: 56 kg (123 lb 7.3 oz)    Examination:  General exam:  Adult Female, lying with eyes closed in bed, moaning.  No acute distress.  HEENT:  NCAT, MMM Respiratory system: Clear to auscultation anteriorly Cardiovascular system: Regular rate and rhythm, normal S1/S2. No murmurs, rubs, gallops or clicks.  Cool extremities Gastrointestinal system: Hypoactive bowel sounds, soft, full feeling, tender to palpation diffusely without rebound or guarding based on facial grimacing.  Patient had a bowel movement during my visit MSK:  Normal tone and bulk, no lower extremity edema Neuro:  Grossly moves all extremities GU:  Stage I pressure ulcer on the left hip, blisters on both heels, sacral area has multiple areas of ulceration, to 2 cm areas of stage 2/3 ulcers near the gluteal fold, stage III ulcer that is larger along the sacrum with an area of necrosis along the superior margin.  Copious malodorous discharge.    Data Reviewed: I have personally reviewed following labs and imaging studies  CBC:  Recent Labs Lab 01/19/17 2011 01/20/17 0814  WBC 14.6* 15.8*  NEUTROABS 12.8*  --   HGB 9.2* 8.5*  HCT 29.2* 27.2*  MCV 89.6 90.7  PLT 233 272   Basic Metabolic Panel:  Recent Labs Lab 01/19/17 2011 01/20/17 0814  NA 139 141  K 3.3* 3.6  CL 105 109  CO2 27 24  GLUCOSE 116* 106*  BUN 11 11  CREATININE 0.76 0.66  CALCIUM 9.2 8.9  MG  --  1.4*  PHOS  --  2.9   GFR: Estimated Creatinine Clearance: 42.1 mL/min (by C-G formula based on SCr of 0.66 mg/dL). Liver Function  Tests:  Recent Labs Lab 01/19/17 2011 01/20/17 0814  AST 24 22  ALT 14 13*  ALKPHOS 74 76  BILITOT 1.0 1.0  PROT 6.9 6.9  ALBUMIN 1.9* 1.8*   No results for input(s): LIPASE, AMYLASE in the last 168 hours. No results for input(s): AMMONIA in the last 168 hours. Coagulation Profile: No results for input(s): INR, PROTIME  in the last 168 hours. Cardiac Enzymes: No results for input(s): CKTOTAL, CKMB, CKMBINDEX, TROPONINI in the last 168 hours. BNP (last 3 results) No results for input(s): PROBNP in the last 8760 hours. HbA1C: No results for input(s): HGBA1C in the last 72 hours. CBG: No results for input(s): GLUCAP in the last 168 hours. Lipid Profile: No results for input(s): CHOL, HDL, LDLCALC, TRIG, CHOLHDL, LDLDIRECT in the last 72 hours. Thyroid Function Tests:  Recent Labs  01/20/17 0814  TSH 0.807   Anemia Panel: No results for input(s): VITAMINB12, FOLATE, FERRITIN, TIBC, IRON, RETICCTPCT in the last 72 hours. Urine analysis:    Component Value Date/Time   COLORURINE YELLOW 01/19/2017 2000   APPEARANCEUR CLEAR 01/19/2017 2000   LABSPEC 1.020 01/19/2017 2000   PHURINE 6.0 01/19/2017 2000   GLUCOSEU NEGATIVE 01/19/2017 2000   HGBUR NEGATIVE 01/19/2017 2000   BILIRUBINUR NEGATIVE 01/19/2017 2000   KETONESUR NEGATIVE 01/19/2017 2000   PROTEINUR 30 (A) 01/19/2017 2000   UROBILINOGEN 0.2 06/12/2013 0632   NITRITE NEGATIVE 01/19/2017 2000   LEUKOCYTESUR NEGATIVE 01/19/2017 2000   Sepsis Labs: @LABRCNTIP (procalcitonin:4,lacticidven:4)  ) Recent Results (from the past 240 hour(s))  Blood culture (routine x 2)     Status: None (Preliminary result)   Collection Time: 01/19/17  9:00 PM  Result Value Ref Range Status   Specimen Description BLOOD RIGHT ARM UPPER  Final   Special Requests IN PEDIATRIC BOTTLE Blood Culture adequate volume  Final   Culture PENDING  Incomplete   Report Status PENDING  Incomplete      Radiology Studies: Dg Chest 2 View  Result  Date: 01/19/2017 CLINICAL DATA:  Increased confusion over the past several days. EXAM: CHEST  2 VIEW COMPARISON:  06/22/2015 FINDINGS: Stable cardiomegaly with aortic atherosclerosis. No acute pneumonic consolidation, effusion or pneumothorax. No overt pulmonary edema. Osteoarthritis of both AC and glenohumeral joints with high-riding humeral heads consistent with chronic rotator cuff tears. IMPRESSION: 1. No active cardiopulmonary disease. 2. Stable cardiomegaly with aortic atherosclerosis. 3. Osteoarthritis of both shoulders. Electronically Signed   By: Ashley Royalty M.D.   On: 01/19/2017 22:19   Dg Sacrum/coccyx  Result Date: 01/19/2017 CLINICAL DATA:  Decubitus ulcer EXAM: SACRUM AND COCCYX - 2+ VIEW COMPARISON:  CT from 06/12/2012. FINDINGS: Sclerosis about both SI joints consistent with osteoarthritis. Surgical clips project over the mid pelvis. Soft tissue ulceration over the the dorsum of the mid sacrum. No conclusive evidence for acute osteomyelitis. Bones however are slightly demineralized in appearance limiting assessment. There is lower lumbar degenerative disc disease from L4 through S1. IMPRESSION: 1. Lower lumbar spondylosis with degenerative disc disease. 2. Decubitus ulcer overlying the dorsum of the mid sacrum without definite radiographic evidence of acute osteomyelitis. Electronically Signed   By: Ashley Royalty M.D.   On: 01/19/2017 22:21     Scheduled Meds: . donepezil  10 mg Oral QHS  . enoxaparin (LOVENOX) injection  40 mg Subcutaneous Q24H  . folic acid  1 mg Oral Daily  . memantine  10 mg Oral BID  . [START ON 01/21/2017] QUEtiapine  37.5 mg Oral QHS  . thiamine  100 mg Oral Daily   Continuous Infusions: . ceFEPime (MAXIPIME) IV 1 g (01/20/17 1457)  . dextrose 5 % and 0.45 % NaCl with KCl 10 mEq/L    . sodium chloride    . vancomycin       LOS: 0 days    Time spent: 30 min    Jveon Pound, MD [facility] Pager  [phone]  If 7PM-7AM, please contact  night-coverage [email or web address] Password [email or web address] 01/20/2017, 3:07 PM

## 2017-01-21 LAB — BASIC METABOLIC PANEL
ANION GAP: 6 (ref 5–15)
BUN: 10 mg/dL (ref 6–20)
CHLORIDE: 112 mmol/L — AB (ref 101–111)
CO2: 24 mmol/L (ref 22–32)
Calcium: 9.2 mg/dL (ref 8.9–10.3)
Creatinine, Ser: 0.65 mg/dL (ref 0.44–1.00)
GFR calc non Af Amer: >60 mL/min (ref >60)
GLUCOSE: 127 mg/dL — AB (ref 65–99)
Potassium: 3.5 mmol/L (ref 3.5–5.1)
Sodium: 142 mmol/L (ref 135–145)

## 2017-01-21 LAB — CBC
HEMATOCRIT: 26.1 % — AB (ref 36.0–46.0)
HEMOGLOBIN: 8.1 g/dL — AB (ref 12.0–15.0)
MCH: 27.6 pg (ref 26.0–34.0)
MCHC: 31 g/dL (ref 30.0–36.0)
MCV: 89.1 fL (ref 78.0–100.0)
Platelets: 207 10*3/uL (ref 150–400)
RBC: 2.93 MIL/uL — AB (ref 3.87–5.11)
RDW: 17.2 % — ABNORMAL HIGH (ref 11.5–15.5)
WBC: 14 10*3/uL — ABNORMAL HIGH (ref 4.0–10.5)

## 2017-01-21 LAB — URINE CULTURE

## 2017-01-21 MED ORDER — ENSURE ENLIVE PO LIQD
237.0000 mL | Freq: Two times a day (BID) | ORAL | Status: DC
  Administered 2017-01-21 – 2017-01-28 (×11): 237 mL via ORAL

## 2017-01-21 MED ORDER — MORPHINE SULFATE (PF) 2 MG/ML IV SOLN
1.0000 mg | INTRAVENOUS | Status: DC | PRN
  Administered 2017-01-22 – 2017-01-27 (×6): 1 mg via INTRAVENOUS
  Filled 2017-01-21 (×6): qty 1

## 2017-01-21 MED ORDER — ENOXAPARIN SODIUM 40 MG/0.4ML ~~LOC~~ SOLN
40.0000 mg | SUBCUTANEOUS | Status: DC
  Administered 2017-01-23 – 2017-01-28 (×6): 40 mg via SUBCUTANEOUS
  Filled 2017-01-21 (×6): qty 0.4

## 2017-01-21 MED ORDER — ADULT MULTIVITAMIN LIQUID CH
15.0000 mL | Freq: Every day | ORAL | Status: DC
  Administered 2017-01-21 – 2017-01-28 (×8): 15 mL via ORAL
  Filled 2017-01-21 (×8): qty 15

## 2017-01-21 MED ORDER — OXYCODONE HCL 5 MG PO TABS
5.0000 mg | ORAL_TABLET | ORAL | Status: DC | PRN
  Administered 2017-01-21 – 2017-01-28 (×5): 5 mg via ORAL
  Filled 2017-01-21 (×5): qty 1

## 2017-01-21 NOTE — Progress Notes (Signed)
Initial Nutrition Assessment  DOCUMENTATION CODES:   Underweight, Severe malnutrition in context of chronic illness  INTERVENTION:   -Continue Ensure Enlive po BID, each supplement provides 350 kcal and 20 grams of protein -Provide Magic cup TID with meals, each supplement provides 290 kcal and 9 grams of protein -Provide Multivitamin with minerals daily -Encourage PO intake  RD will continue to monitor for plan  NUTRITION DIAGNOSIS:   Malnutrition(severe) related to chronic illness, wound healing, lethargy/confusion as evidenced by severe depletion of body fat, severe depletion of muscle mass.  GOAL:   Patient will meet greater than or equal to 90% of their needs  MONITOR:   PO intake, Supplement acceptance, Labs, Weight trends, Skin, I & O's  REASON FOR ASSESSMENT:   Consult Assessment of nutrition requirement/status  ASSESSMENT:   83 year old female with history of dementia with behavioral disturbance, atrial fibrillation, recurrent UTI and colon cancer who presents with increased confusion, refusal to eat, strong odor of urine and strong odor from her sacral decubitus ulcer. She has been bedbound for several weeks now.  She was found to be septic with fever, tachycardia, altered mentation. She was started on broad-spectrum antibiotics for suspected sacral decubitus ulcer infection.    Patient in room with no family at bedside. Pt sleeping soundly. Pt with dementia, unable to provide history. Per palliative care note, pt was drinking an ensure supplement. Will continue order along with Magic Cups. Per chart review, family has reported poor intakes for 3 weeks PTA. Pt has been bedbound during this time. Now with pressure injuries.  SLP evaluated 8/26: recommends dysphagia 2 diet with thin liquids. Mild aspiration risk.  No recent weight loss noted in chart. Nutrition-Focused physical exam completed. Findings are severe fat depletion, severe muscle depletion, and no edema.    Medications: Folic acid tablet daily, Thiamine tablet daily, D5 and .45% NaCl w/ KCl infusion at 75 ml/hr -provides 306 kcal Labs reviewed: Low Mg  Diet Order:  DIET DYS 2 Room service appropriate? Yes; Fluid consistency: Thin  Skin:  Wound (see comment) (Pressure injuries: Stage IV sacrum, Stage I hip, DTI on rt heel)  Last BM:  8/27  Height:   Ht Readings from Last 1 Encounters:  01/20/17 5\' 9"  (1.753 m)    Weight:   Wt Readings from Last 1 Encounters:  01/20/17 123 lb 7.3 oz (56 kg)    Ideal Body Weight:  65.9 kg  BMI:  Body mass index is 18.23 kg/m.  Estimated Nutritional Needs:   Kcal:  1350-1550  Protein:  65-75g  Fluid:  1.5L/day  EDUCATION NEEDS:   No education needs identified at this time  Clayton Bibles, MS, RD, LDN Pager: [phone] After Hours Pager: [phone]

## 2017-01-21 NOTE — Progress Notes (Signed)
Daily Progress Note   Patient Name: Kathleen [name]       Date: 01/21/2017 DOB: [birth year]  Age: 83 y.o. MRN#: [id number] Attending Physician: Janece Canterbury, MD Primary Care Physician: Jani Gravel, MD Admit Date: 01/19/2017  Reason for Consultation/Follow-up: Establishing goals of care  Subjective: Kathleen [name] appears to have pain this morning. Caregivers from home at bedside. They state she has been drinking ensure this morning.  Spoke with daughter Kathleen [name] this morning who wants to continue current treatment.    Length of Stay: 1  Current Medications: Scheduled Meds:  . donepezil  10 mg Oral QHS  . enoxaparin (LOVENOX) injection  40 mg Subcutaneous Q24H  . feeding supplement (ENSURE ENLIVE)  237 mL Oral BID BM  . folic acid  1 mg Oral Daily  . mouth rinse  15 mL Mouth Rinse BID  . memantine  10 mg Oral BID  . QUEtiapine  37.5 mg Oral QHS  . thiamine  100 mg Oral Daily    Continuous Infusions: . ceFEPime (MAXIPIME) IV 1 g (01/21/17 1041)  . dextrose 5 % and 0.45 % NaCl with KCl 10 mEq/L 75 mL/hr at 01/21/17 1042  . vancomycin 500 mg (01/21/17 1041)    PRN Meds: acetaminophen **OR** acetaminophen, aspirin EC, bisacodyl, morphine injection, ondansetron **OR** ondansetron (ZOFRAN) IV, oxyCODONE, polyethylene glycol  Physical Exam  Constitutional: She appears distressed.  Appears to have pain.   Eyes: EOM are normal.  Pulmonary/Chest: Effort normal. No respiratory distress.  Neurological: She is alert.           Thin frail lady, moaning at times Eyes open, tracks her caregivers present in the room Shallow clear respirations S1 S2 Abdomen soft  Vital Signs: BP 133/76 (BP Location: Left Arm)   Pulse 87   Temp 97.9 F (36.6 C) (Axillary)   Resp 16   Ht 5\' 9"  (1.753 m)    Wt 56 kg (123 lb 7.3 oz)   SpO2 91%   BMI 18.23 kg/m  SpO2: SpO2: 91 % O2 Device: O2 Device: Not Delivered O2 Flow Rate: O2 Flow Rate (L/min): 3 L/min  Intake/output summary:  Intake/Output Summary (Last 24 hours) at 01/21/17 1142 Last data filed at 01/21/17 0900  Gross per 24 hour  Intake           513.33  ml  Output                0 ml  Net           513.33 ml   LBM: Last BM Date: 01/21/17 Baseline Weight: Weight: 56 kg (123 lb 7.3 oz) Most recent weight: Weight: 56 kg (123 lb 7.3 oz)       Palliative Assessment/Data: 20%    Flowsheet Rows     Most Recent Value  Intake Tab  Referral Department  Hospitalist  Unit at Time of Referral  Med/Surg Unit  Palliative Care Primary Diagnosis  Other (Comment) [dementia, sacral decub, decline. ]  Palliative Care Type  New Palliative care  Reason for referral  Clarify Goals of Care, Counsel Regarding Hospice  Date first seen by Palliative Care  01/20/17  Clinical Assessment  Palliative Performance Scale Score  20%  Pain Max last 24 hours  4  Pain Min Last 24 hours  3  Dyspnea Max Last 24 Hours  3  Dyspnea Min Last 24 hours  2  Nausea Max Last 24 Hours  3  Nausea Min Last 24 Hours  2  Anxiety Max Last 24 Hours  3  Anxiety Min Last 24 Hours  2  Psychosocial & Spiritual Assessment  Palliative Care Outcomes  Patient/Family meeting held?  Yes  Who was at the meeting?  daughter Kathleen [name] [facility] agent.   Palliative Care Outcomes  Clarified goals of care      Patient Active Problem List   Diagnosis Date Noted  . Decubitus ulcer of coccyx, stage IV ([place])   . Encounter for palliative care   . Goals of care, counseling/discussion   . Sacral decubitus ulcer 01/19/2017  . Sepsis ([place]) 01/19/2017  . Hypokalemia 01/19/2017  . Hypoalbuminemia 01/19/2017  . Acute encephalopathy 06/12/2013  . Hypothermia 06/12/2013  . Atrial fibrillation ([place]) 06/12/2013  . Dementia of Alzheimer's type with behavioral disturbance 06/12/2013  .  Mild dehydration 06/12/2013  . Encephalopathy acute 06/12/2012  . Fever 06/12/2012  . UTI (lower urinary tract infection) 06/12/2012  . History of colon cancer 06/12/2012    Palliative Care Assessment & Plan   Patient Profile: Patient is a 83 year old lady with a past medical history significant for advanced Alzheimer's dementia who lives at home with her daughter. She essentially has [facility]. It is noted that for the past 2-3 weeks or so the patient has had diminishing oral intake has become more bedbound. She has underlying atrial fibrillation. She has been admitted with encephalopathy and stage IV decubitus ulcer   Assessment: Kathleen [name] appears to have pain this morning and is repositioning herself. She is continuing antibiotics which have been adjusted for infection. Her WBC's have decreased and she has been afebrile.    Recommendations/Plan:  Spoke with daughter Kathleen [name], who wishes to continue current level of therapy. Will continue to follow. Daughter Kathleen [name] remains hopeful for some degree of stabilization/recovery. We will follow hospital course and help address symptom management and disposition needs.     Oxycodone 5mg  q 4 hours PRN for moderate pain and Morphine IV 1mg  q 3 hours PRN for severe pain added.   Goals of Care and Additional Recommendations:  Limitations on Scope of Treatment: DNR  Code Status:    Code Status Orders        Start     Ordered   01/20/17 0336  Do not attempt resuscitation (DNR)  Continuous    Question Answer Comment  In  the event of cardiac or respiratory ARREST Do not call a "code blue"   In the event of cardiac or respiratory ARREST Do not perform Intubation, CPR, defibrillation or ACLS   In the event of cardiac or respiratory ARREST Use medication by any route, position, wound care, and other measures to relive pain and suffering. May use oxygen, suction and manual treatment of airway obstruction as needed for comfort.      01/20/17  0335    Code Status History    Date Active Date Inactive Code Status Order ID Comments User Context   06/12/2013  9:01 AM 06/13/2013  2:36 PM Full Code [id number]  Barton Dubois, MD Inpatient   06/12/2012 11:35 AM 06/15/2012  7:16 PM Full Code [id number]  Jeannine Kitten, RN Inpatient    Advance Directive Documentation     Most Recent Value  Type of Advance Directive  Healthcare Power of Attorney  Pre-existing out of facility DNR order (yellow form or pink MOST form)  -  "MOST" Form in Place?  -       Prognosis:   < 6 months Depends on PO intake and resolution of infection. Overall poor prognosis and failure to thrive.   Discharge Planning:  To Be Determined Home with hospice care vs SNF at this time.   Care plan was discussed with daughter.  Thank you for allowing the Palliative Medicine Team to assist in the care of this patient.   Time In: 11:00 Time Out: 11:59 Total Time 59 minutes Prolonged Time Billed No       Greater than 50%  of this time was spent counseling and coordinating care related to the above assessment and plan.  Asencion Gowda, NP Loistine Chance MD [phone])  Please contact Palliative Medicine Team phone at [phone] for questions and concerns.

## 2017-01-21 NOTE — Progress Notes (Signed)
Unable to complete admission questions d/t hx of dementia, will pass onto next RN if family is present and can answer questions to finish admission.

## 2017-01-21 NOTE — Progress Notes (Signed)
PROGRESS NOTE  Kathleen [name]  HDQ:[id number] DOB: [birth year] DOA: 01/19/2017 PCP: Jani Gravel, MD  Brief Narrative:   The patient is an 83 year old female with history of dementia with behavioral disturbance, atrial fibrillation, recurrent UTI and colon cancer who presents with increased confusion, refusal to eat, strong odor of urine and strong odor from her sacral decubitus ulcer. She has been bedbound for several weeks now.  She was found to be septic with fever, tachycardia, altered mentation. She was started on broad-spectrum antibiotics for suspected sacral decubitus ulcer infection.  She appears clinically improved. Consult to General surgery for possible debridement.  Assessment & Plan:   Active Problems:   History of colon cancer   Acute encephalopathy   Atrial fibrillation (HCC)   Dementia of Alzheimer's type with behavioral disturbance   Sacral decubitus ulcer   Sepsis ([place])   Hypokalemia   Hypoalbuminemia   Decubitus ulcer of coccyx, stage IV ([place])   Encounter for palliative care   Goals of care, counseling/discussion  Sepsis secondary to infection of her sacral decubitus ulcer.  Her chest x-ray and urinalysis were unremarkable. She has foul odor with drainage of her sacral ulcer.  X-ray did not demonstrate evidence of acute osteomyelitis sacrum.  Leukocytosis, fevers, and mentation improving. -  Lactic acid trending down with IV fluids -  Continue IV fluids -  Continue vancomycin -  MRSA PCR negative > d/c vancomycin -  Continue cefepime  -  Appreciate Wound care consult -  General surgery consult -  Palliative care consult appreciated  Pain, likely on sacrum -  continue tylenol and prn morphine/oxycodone per palliative care  Metabolic encephalopathy, speaking some with her caretakers today -  Continue IV fluids and antibiotics -  Aspiration precautions and speech therapy evaluation, appreciate assistance -  Dysphagia 2 with thin   Dementia with behavioral  disturbance -  Continue namenda, seroquel, and aricept  Paroxysmal atrial fibrillation, no longer on anticoagulation due to falls risk - continue aspirin  History of colon cancer, stable  Severe protein calorie malnutrition -  Nutrition consultation appreciated -  Supplements  Normocytic anemia, hgb trending down, likely hemodilutional and due to sepsis -  Iron studies, B12, folate -  TSH wnl -  Occult stool -  Repeat hgb in AM  DVT prophylaxis:  Lovenox (need to hold in AM) Code Status:  DNR/DO NOT INTUBATE Family Communication:  Spoke with daughter and caretakers at bedside. Daughter gave permission to speak about medical issues with caretakers Disposition Plan:  Likely to SNF versus home. Patient has multiple caretakers.   Consultants:   Palliative care  Wound care  Procedures:   None  Antimicrobials:  Anti-infectives    Start     Dose/Rate Route Frequency Ordered Stop   01/20/17 2200  vancomycin (VANCOCIN) 500 mg in sodium chloride 0.9 % 100 mL IVPB     500 mg 100 mL/hr over 60 Minutes Intravenous Every 12 hours 01/20/17 0638     01/20/17 1400  metroNIDAZOLE (FLAGYL) IVPB 500 mg  Status:  Discontinued     500 mg 100 mL/hr over 60 Minutes Intravenous Every 8 hours 01/20/17 0510 01/20/17 1244   01/20/17 1400  aztreonam (AZACTAM) 2 g in dextrose 5 % 50 mL IVPB  Status:  Discontinued     2 g 100 mL/hr over 30 Minutes Intravenous Every 8 hours 01/20/17 0542 01/20/17 0638   01/20/17 1400  aztreonam (AZACTAM) 2 GM IVPB  Status:  Discontinued     2  g 100 mL/hr over 30 Minutes Intravenous Every 8 hours 01/20/17 0638 01/20/17 1244   01/20/17 1400  ceFEPIme (MAXIPIME) 1 g in dextrose 5 % 50 mL IVPB     1 g 100 mL/hr over 30 Minutes Intravenous Every 12 hours 01/20/17 1257     01/20/17 0600  vancomycin (VANCOCIN) IVPB 1000 mg/200 mL premix     1,000 mg 200 mL/hr over 60 Minutes Intravenous  Once 01/20/17 0335 01/20/17 1012   01/20/17 0345  aztreonam (AZACTAM) 2 g in  dextrose 5 % 50 mL IVPB     2 g 100 mL/hr over 30 Minutes Intravenous  Once 01/20/17 0335 01/20/17 0508   01/20/17 0345  metroNIDAZOLE (FLAGYL) IVPB 500 mg     500 mg 100 mL/hr over 60 Minutes Intravenous  Once 01/20/17 0335 01/20/17 4765       Subjective:  Patient has dementia but is able to communicate with me today. She denies that she has any difficulty breathing, nausea, headache, chest pains. Indicates that she has pain somewhere but denies that is in her abdomen or on her buttocks or in her legs.  Has been moaning. Palliative care has started some pain medication.  Objective: Vitals:   01/20/17 1330 01/20/17 2025 01/21/17 0518 01/21/17 1440  BP: (!) 114/47 (!) 122/51 133/76 (!) 104/41  Pulse: 67 78 87   Resp:  16 16   Temp:  98 F (36.7 C) 97.9 F (36.6 C)   TempSrc: Other (Comment) Axillary Axillary   SpO2: 100% 100% 91%   Weight:      Height:        Intake/Output Summary (Last 24 hours) at 01/21/17 1520 Last data filed at 01/21/17 1500  Gross per 24 hour  Intake          1623.33 ml  Output                0 ml  Net          1623.33 ml   Filed Weights   01/20/17 0331  Weight: 56 kg (123 lb 7.3 oz)    Examination:  General exam:  Adult Female, Eyes open, taking some applesauce from one of her caretakers HEENT:  NCAT, MMM Respiratory system: Clear to auscultation bilaterally Cardiovascular system: Regular rate and rhythm, normal S1/S2. No murmurs, rubs, gallops or clicks.  Warm extremities Gastrointestinal system: Normal active bowel sounds, soft, nondistended, nontender. MSK:  Normal tone and bulk, no lower extremity edema Neuro:  Grossly moves all extremities Sacral and heel ulcers were not observed today. See my note from 8/26 for details  Data Reviewed: I have personally reviewed following labs and imaging studies  CBC:  Recent Labs Lab 01/19/17 2011 01/20/17 0814 01/21/17 0432  WBC 14.6* 15.8* 14.0*  NEUTROABS 12.8*  --   --   HGB 9.2* 8.5*  8.1*  HCT 29.2* 27.2* 26.1*  MCV 89.6 90.7 89.1  PLT 233 218 465   Basic Metabolic Panel:  Recent Labs Lab 01/19/17 2011 01/20/17 0814 01/21/17 0432  NA 139 141 142  K 3.3* 3.6 3.5  CL 105 109 112*  CO2 27 24 24   GLUCOSE 116* 106* 127*  BUN 11 11 10   CREATININE 0.76 0.66 0.65  CALCIUM 9.2 8.9 9.2  MG  --  1.4*  --   PHOS  --  2.9  --    GFR: Estimated Creatinine Clearance: 42.1 mL/min (by C-G formula based on SCr of 0.65 mg/dL). Liver Function Tests:  Recent  Labs Lab 01/19/17 2011 01/20/17 0814  AST 24 22  ALT 14 13*  ALKPHOS 74 76  BILITOT 1.0 1.0  PROT 6.9 6.9  ALBUMIN 1.9* 1.8*   No results for input(s): LIPASE, AMYLASE in the last 168 hours. No results for input(s): AMMONIA in the last 168 hours. Coagulation Profile: No results for input(s): INR, PROTIME in the last 168 hours. Cardiac Enzymes: No results for input(s): CKTOTAL, CKMB, CKMBINDEX, TROPONINI in the last 168 hours. BNP (last 3 results) No results for input(s): PROBNP in the last 8760 hours. HbA1C: No results for input(s): HGBA1C in the last 72 hours. CBG: No results for input(s): GLUCAP in the last 168 hours. Lipid Profile: No results for input(s): CHOL, HDL, LDLCALC, TRIG, CHOLHDL, LDLDIRECT in the last 72 hours. Thyroid Function Tests:  Recent Labs  01/20/17 0814  TSH 0.807   Anemia Panel: No results for input(s): VITAMINB12, FOLATE, FERRITIN, TIBC, IRON, RETICCTPCT in the last 72 hours. Urine analysis:    Component Value Date/Time   COLORURINE YELLOW 01/19/2017 2000   APPEARANCEUR CLEAR 01/19/2017 2000   LABSPEC 1.020 01/19/2017 2000   PHURINE 6.0 01/19/2017 2000   GLUCOSEU NEGATIVE 01/19/2017 2000   HGBUR NEGATIVE 01/19/2017 2000   BILIRUBINUR NEGATIVE 01/19/2017 2000   KETONESUR NEGATIVE 01/19/2017 2000   PROTEINUR 30 (A) 01/19/2017 2000   UROBILINOGEN 0.2 06/12/2013 0632   NITRITE NEGATIVE 01/19/2017 2000   LEUKOCYTESUR NEGATIVE 01/19/2017 2000   Sepsis  Labs: @LABRCNTIP (procalcitonin:4,lacticidven:4)  ) Recent Results (from the past 240 hour(s))  Urine culture     Status: Abnormal   Collection Time: 01/19/17  8:00 PM  Result Value Ref Range Status   Specimen Description URINE, CATHETERIZED  Final   Special Requests NONE  Final   Culture MULTIPLE SPECIES PRESENT, SUGGEST RECOLLECTION (A)  Final   Report Status 01/21/2017 FINAL  Final  Blood culture (routine x 2)     Status: None (Preliminary result)   Collection Time: 01/19/17  8:50 PM  Result Value Ref Range Status   Specimen Description BLOOD BLOOD LEFT FOREARM  Final   Special Requests IN PEDIATRIC BOTTLE Blood Culture adequate volume  Final   Culture   Final    NO GROWTH 1 DAY Performed at [facility], [place] [address], [place],  [address]    Report Status PENDING  Incomplete  Blood culture (routine x 2)     Status: None (Preliminary result)   Collection Time: 01/19/17  9:00 PM  Result Value Ref Range Status   Specimen Description BLOOD RIGHT ARM UPPER  Final   Special Requests IN PEDIATRIC BOTTLE Blood Culture adequate volume  Final   Culture PENDING  Incomplete   Report Status PENDING  Incomplete  MRSA PCR Screening     Status: None   Collection Time: 01/20/17  2:57 PM  Result Value Ref Range Status   MRSA by PCR NEGATIVE NEGATIVE Final    Comment:        The GeneXpert MRSA Assay (FDA approved for NASAL specimens only), is one component of a comprehensive MRSA colonization surveillance program. It is not intended to diagnose MRSA infection nor to guide or monitor treatment for MRSA infections.       Radiology Studies: Dg Chest 2 View  Result Date: 01/19/2017 CLINICAL DATA:  Increased confusion over the past several days. EXAM: CHEST  2 VIEW COMPARISON:  06/22/2015 FINDINGS: Stable cardiomegaly with aortic atherosclerosis. No acute pneumonic consolidation, effusion or pneumothorax. No overt pulmonary edema. Osteoarthritis of  both AC and glenohumeral  joints with high-riding humeral heads consistent with chronic rotator cuff tears. IMPRESSION: 1. No active cardiopulmonary disease. 2. Stable cardiomegaly with aortic atherosclerosis. 3. Osteoarthritis of both shoulders. Electronically Signed   By: Ashley Royalty M.D.   On: 01/19/2017 22:19   Dg Sacrum/coccyx  Result Date: 01/19/2017 CLINICAL DATA:  Decubitus ulcer EXAM: SACRUM AND COCCYX - 2+ VIEW COMPARISON:  CT from 06/12/2012. FINDINGS: Sclerosis about both SI joints consistent with osteoarthritis. Surgical clips project over the mid pelvis. Soft tissue ulceration over the the dorsum of the mid sacrum. No conclusive evidence for acute osteomyelitis. Bones however are slightly demineralized in appearance limiting assessment. There is lower lumbar degenerative disc disease from L4 through S1. IMPRESSION: 1. Lower lumbar spondylosis with degenerative disc disease. 2. Decubitus ulcer overlying the dorsum of the mid sacrum without definite radiographic evidence of acute osteomyelitis. Electronically Signed   By: Ashley Royalty M.D.   On: 01/19/2017 22:21     Scheduled Meds: . donepezil  10 mg Oral QHS  . [START ON 01/23/2017] enoxaparin (LOVENOX) injection  40 mg Subcutaneous Q24H  . feeding supplement (ENSURE ENLIVE)  237 mL Oral BID BM  . folic acid  1 mg Oral Daily  . mouth rinse  15 mL Mouth Rinse BID  . memantine  10 mg Oral BID  . multivitamin  15 mL Oral Daily  . QUEtiapine  37.5 mg Oral QHS  . thiamine  100 mg Oral Daily   Continuous Infusions: . ceFEPime (MAXIPIME) IV Stopped (01/21/17 1111)  . dextrose 5 % and 0.45 % NaCl with KCl 10 mEq/L 75 mL/hr at 01/21/17 1042  . vancomycin Stopped (01/21/17 1141)     LOS: 1 day    Time spent: 30 min    Janece Canterbury, MD [facility] Pager [phone]  If 7PM-7AM, please contact night-coverage [email or web address] Password [email or web address] 01/21/2017, 3:20 PM

## 2017-01-21 NOTE — Progress Notes (Signed)
OT Cancellation Note  Patient Details Name: Kathleen [name] MRN: [id number] DOB: 1925/10/19   Cancelled Treatment:    Reason Eval/Treat Not Completed: Other (comment)  Pt sleeping soundly. OT role explained and discussed.  OT will return next day to assess pt  [place], Mickel Baas, [place] 01/21/2017, 1:46 PM

## 2017-01-21 NOTE — Consult Note (Signed)
[facility] Surgery Consult Note  Kathleen [name] [birth year]  [id number].    Requesting MD: Short Chief Complaint/Reason for Consult: Sacral Decubitus Ulcer HPI:  Patient is a 83 y/o female who was admitted with sepsis believed to be related to infection of her chronic decubitus ulcer. She was noted to have a strong odor from the wound. She has been bedbound for several weeks. She has been on broad-spectrum antibiotics and sepsis pathophysiology seems to have improved significantly. WOC saw patient and recommended surgical consult to debride area if desired.   No family was present at the bedside at time of assessment, so information is taken from the chart as patient is unable to provide history.   ROS: Review of Systems  Unable to perform ROS: Dementia    Family History  Problem Relation Age of Onset  . Family history unknown: Yes    Past Medical History:  Diagnosis Date  . A-fib ([place])   . Alzheimer's dementia   . Cancer colon     Past Surgical History:  Procedure Laterality Date  . APPENDECTOMY    . CHOLECYSTECTOMY    . COLON SURGERY    . TONSILLECTOMY      Social History:  reports that she has never smoked. She has never used smokeless tobacco. She reports that she does not drink alcohol or use drugs.  Allergies:  Allergies  Allergen Reactions  . Penicillins Other (See Comments)    Pt family and pt does not know the reaction. Per records, she has tolerated Rocephin.    Medications Prior to Admission  Medication Sig Dispense Refill  . ALPRAZolam (XANAX) 1 MG tablet Take 0.5-1 mg by mouth 2 (two) times daily. 0.5 tablet in the am and '1mg'$  at bedtime    . aspirin EC 81 MG tablet Take 81 mg by mouth daily as needed for mild pain (hemorrhoids).    . Cyanocobalamin (VITAMIN B-12 CR PO) Take 1 tablet by mouth daily.    Marland Kitchen donepezil (ARICEPT) 10 MG tablet Take 10 mg by mouth at bedtime.    . memantine (NAMENDA) 10 MG tablet Take 10 mg by mouth 2 (two) times daily.      . QUEtiapine (SEROQUEL) 25 MG tablet Take 37.5 mg by mouth at bedtime.    . Vitamin D, Ergocalciferol, (DRISDOL) 50000 UNITS CAPS capsule Take 50,000 Units by mouth every 7 (seven) days. On Wednesday    . diltiazem (CARDIZEM CD) 120 MG 24 hr capsule Take 1 capsule (120 mg total) by mouth daily. (Patient not taking: Reported on 01/19/2017) 30 capsule 1    Blood pressure 133/76, pulse 87, temperature 97.9 F (36.6 C), temperature source Axillary, resp. rate 16, height '5\' 9"'$  (1.753 m), weight 56 kg (123 lb 7.3 oz), SpO2 91 %. Physical Exam: Physical Exam  Constitutional: She appears well-developed. She appears lethargic. She appears cachectic. She is easily aroused.  Non-toxic appearance. No distress.  HENT:  Head: Normocephalic and atraumatic.  Right Ear: External ear normal.  Left Ear: External ear normal.  Nose: Nose normal.  Eyes: Pupils are equal, round, and reactive to light. Conjunctivae and lids are normal. No scleral icterus.  Neck: Normal range of motion. Neck supple. No thyromegaly present.  Cardiovascular: Normal rate and regular rhythm.   Pulses:      Radial pulses are 2+ on the right side, and 2+ on the left side.       Dorsalis pedis pulses are 2+ on the right side, and 2+ on the  left side.  Pulmonary/Chest: Effort normal and breath sounds normal. No respiratory distress.  Abdominal: Soft. Normal appearance and bowel sounds are normal. She exhibits no distension and no mass. There is no hepatosplenomegaly. There is no tenderness. There is no guarding. No hernia.  Genitourinary:  Genitourinary Comments: Female wicking catheter present, very dark urine present in canister  Musculoskeletal:  No gross deformities of bilateral upper or lower extremities  Neurological: She is easily aroused. She appears lethargic. She is disoriented.  Skin: Skin is warm and dry. No rash noted.  See photo below for sacral decubitus ulcer. Pressure relief boots on feet bilaterally  Psychiatric:   Unable to assess     Sacral decubitus ulcer with necrotic tissue overlying with another small stage II pressure ulcer beneath   Results for orders placed or performed during the hospital encounter of 01/19/17 (from the past 48 hour(s))  Urinalysis, Routine w reflex microscopic     Status: Abnormal   Collection Time: 01/19/17  8:00 PM  Result Value Ref Range   Color, Urine YELLOW YELLOW   APPearance CLEAR CLEAR   Specific Gravity, Urine 1.020 1.005 - 1.030   pH 6.0 5.0 - 8.0   Glucose, UA NEGATIVE NEGATIVE mg/dL   Hgb urine dipstick NEGATIVE NEGATIVE   Bilirubin Urine NEGATIVE NEGATIVE   Ketones, ur NEGATIVE NEGATIVE mg/dL   Protein, ur 30 (A) NEGATIVE mg/dL   Nitrite NEGATIVE NEGATIVE   Leukocytes, UA NEGATIVE NEGATIVE   RBC / HPF 0-5 0 - 5 RBC/hpf   WBC, UA NONE SEEN 0 - 5 WBC/hpf   Bacteria, UA RARE (A) NONE SEEN   Squamous Epithelial / LPF 0-5 (A) NONE SEEN  Urine culture     Status: Abnormal   Collection Time: 01/19/17  8:00 PM  Result Value Ref Range   Specimen Description URINE, CATHETERIZED    Special Requests NONE    Culture MULTIPLE SPECIES PRESENT, SUGGEST RECOLLECTION (A)    Report Status 01/21/2017 FINAL   Comprehensive metabolic panel     Status: Abnormal   Collection Time: 01/19/17  8:11 PM  Result Value Ref Range   Sodium 139 135 - 145 mmol/L   Potassium 3.3 (L) 3.5 - 5.1 mmol/L   Chloride 105 101 - 111 mmol/L   CO2 27 22 - 32 mmol/L   Glucose, Bld 116 (H) 65 - 99 mg/dL   BUN 11 6 - 20 mg/dL   Creatinine, Ser 0.76 0.44 - 1.00 mg/dL   Calcium 9.2 8.9 - 10.3 mg/dL   Total Protein 6.9 6.5 - 8.1 g/dL   Albumin 1.9 (L) 3.5 - 5.0 g/dL   AST 24 15 - 41 U/L   ALT 14 14 - 54 U/L   Alkaline Phosphatase 74 38 - 126 U/L   Total Bilirubin 1.0 0.3 - 1.2 mg/dL   GFR calc non Af Amer >60 >60 mL/min   GFR calc Af Amer >60 >60 mL/min    Comment: (NOTE) The eGFR has been calculated using the CKD EPI equation. This calculation has not been validated in all clinical  situations. eGFR's persistently <60 mL/min signify possible Chronic Kidney Disease.    Anion gap 7 5 - 15  CBC with Differential     Status: Abnormal   Collection Time: 01/19/17  8:11 PM  Result Value Ref Range   WBC 14.6 (H) 4.0 - 10.5 K/uL   RBC 3.26 (L) 3.87 - 5.11 MIL/uL   Hemoglobin 9.2 (L) 12.0 - 15.0 g/dL   HCT 29.2 (  L) 36.0 - 46.0 %   MCV 89.6 78.0 - 100.0 fL   MCH 28.2 26.0 - 34.0 pg   MCHC 31.5 30.0 - 36.0 g/dL   RDW 17.2 (H) 11.5 - 15.5 %   Platelets 233 150 - 400 K/uL   Neutrophils Relative % 88 %   Neutro Abs 12.8 (H) 1.7 - 7.7 K/uL   Lymphocytes Relative 7 %   Lymphs Abs 1.0 0.7 - 4.0 K/uL   Monocytes Relative 5 %   Monocytes Absolute 0.8 0.1 - 1.0 K/uL   Eosinophils Relative 0 %   Eosinophils Absolute 0.0 0.0 - 0.7 K/uL   Basophils Relative 0 %   Basophils Absolute 0.0 0.0 - 0.1 K/uL  Blood culture (routine x 2)     Status: None (Preliminary result)   Collection Time: 01/19/17  9:00 PM  Result Value Ref Range   Specimen Description BLOOD RIGHT ARM UPPER    Special Requests IN PEDIATRIC BOTTLE Blood Culture adequate volume    Culture PENDING    Report Status PENDING   I-Stat CG4 Lactic Acid, ED     Status: None   Collection Time: 01/19/17  9:06 PM  Result Value Ref Range   Lactic Acid, Venous 1.54 0.5 - 1.9 mmol/L  Blood gas, venous     Status: Abnormal   Collection Time: 01/20/17  1:46 AM  Result Value Ref Range   O2 Content 2.0 L/min   Delivery systems NASAL CANNULA    pH, Ven 7.344 7.250 - 7.430   pCO2, Ven 52.5 44.0 - 60.0 mmHg   pO2, Ven BELOW REPORTABLE RANGE. 32.0 - 45.0 mmHg    Comment: CRITICAL RESULT CALLED TO, READ BACK BY AND VERIFIED WITH: ARomilda Garret, RN AT [phone] ON 01/20/17 BY Danella Sensing, RRT, RCP    Bicarbonate 27.7 20.0 - 28.0 mmol/L   Acid-Base Excess 2.2 (H) 0.0 - 2.0 mmol/L   O2 Saturation 38.3 %   Patient temperature 99.1    Collection site VEIN    Drawn by DRAWN BY RN    Sample type VENOUS   Lactic acid, plasma     Status:  Abnormal   Collection Time: 01/20/17  8:14 AM  Result Value Ref Range   Lactic Acid, Venous 2.0 (HH) 0.5 - 1.9 mmol/L    Comment: CRITICAL RESULT CALLED TO, READ BACK BY AND VERIFIED WITH: GAGLIANO,S AT 9:15AM ON 01/20/17 BY FESTERMAN,C   Procalcitonin     Status: None   Collection Time: 01/20/17  8:14 AM  Result Value Ref Range   Procalcitonin 0.36 ng/mL    Comment:        Interpretation: PCT (Procalcitonin) <= 0.5 ng/mL: Systemic infection (sepsis) is not likely. Local bacterial infection is possible. (NOTE)         ICU PCT Algorithm               Non ICU PCT Algorithm    ----------------------------     ------------------------------         PCT < 0.25 ng/mL                 PCT < 0.1 ng/mL     Stopping of antibiotics            Stopping of antibiotics       strongly encouraged.               strongly encouraged.    ----------------------------     ------------------------------  PCT level decrease by               PCT < 0.25 ng/mL       >= 80% from peak PCT       OR PCT 0.25 - 0.5 ng/mL          Stopping of antibiotics                                             encouraged.     Stopping of antibiotics           encouraged.    ----------------------------     ------------------------------       PCT level decrease by              PCT >= 0.25 ng/mL       < 80% from peak PCT        AND PCT >= 0.5 ng/mL            Continuin g antibiotics                                              encouraged.       Continuing antibiotics            encouraged.    ----------------------------     ------------------------------     PCT level increase compared          PCT > 0.5 ng/mL         with peak PCT AND          PCT >= 0.5 ng/mL             Escalation of antibiotics                                          strongly encouraged.      Escalation of antibiotics        strongly encouraged.   Magnesium     Status: Abnormal   Collection Time: 01/20/17  8:14 AM  Result Value Ref Range     Magnesium 1.4 (L) 1.7 - 2.4 mg/dL  Phosphorus     Status: None   Collection Time: 01/20/17  8:14 AM  Result Value Ref Range   Phosphorus 2.9 2.5 - 4.6 mg/dL  TSH     Status: None   Collection Time: 01/20/17  8:14 AM  Result Value Ref Range   TSH 0.807 0.350 - 4.500 uIU/mL    Comment: Performed by a 3rd Generation assay with a functional sensitivity of <=0.01 uIU/mL.  Comprehensive metabolic panel     Status: Abnormal   Collection Time: 01/20/17  8:14 AM  Result Value Ref Range   Sodium 141 135 - 145 mmol/L   Potassium 3.6 3.5 - 5.1 mmol/L   Chloride 109 101 - 111 mmol/L   CO2 24 22 - 32 mmol/L   Glucose, Bld 106 (H) 65 - 99 mg/dL   BUN 11 6 - 20 mg/dL   Creatinine, Ser 0.66 0.44 - 1.00 mg/dL   Calcium 8.9 8.9 - 10.3 mg/dL   Total Protein 6.9 6.5 - 8.1 g/dL   Albumin 1.8 (  L) 3.5 - 5.0 g/dL   AST 22 15 - 41 U/L   ALT 13 (L) 14 - 54 U/L   Alkaline Phosphatase 76 38 - 126 U/L   Total Bilirubin 1.0 0.3 - 1.2 mg/dL   GFR calc non Af Amer >60 >60 mL/min   GFR calc Af Amer >60 >60 mL/min    Comment: (NOTE) The eGFR has been calculated using the CKD EPI equation. This calculation has not been validated in all clinical situations. eGFR's persistently <60 mL/min signify possible Chronic Kidney Disease.    Anion gap 8 5 - 15  CBC     Status: Abnormal   Collection Time: 01/20/17  8:14 AM  Result Value Ref Range   WBC 15.8 (H) 4.0 - 10.5 K/uL   RBC 3.00 (L) 3.87 - 5.11 MIL/uL   Hemoglobin 8.5 (L) 12.0 - 15.0 g/dL   HCT 27.2 (L) 36.0 - 46.0 %   MCV 90.7 78.0 - 100.0 fL   MCH 28.3 26.0 - 34.0 pg   MCHC 31.3 30.0 - 36.0 g/dL   RDW 17.2 (H) 11.5 - 15.5 %   Platelets 218 150 - 400 K/uL  Lactic acid, plasma     Status: Abnormal   Collection Time: 01/20/17 11:26 AM  Result Value Ref Range   Lactic Acid, Venous 2.3 (HH) 0.5 - 1.9 mmol/L    Comment: CRITICAL RESULT CALLED TO, READ BACK BY AND VERIFIED WITH: GAGLIANO,S AT 12:00PM ON 01/20/17 BY FESTERMAN,C   Prealbumin     Status:  Abnormal   Collection Time: 01/20/17 11:26 AM  Result Value Ref Range   Prealbumin <5 (L) 18 - 38 mg/dL    Comment: Performed at [facility], [place] [address], [place], [place] [address]  MRSA PCR Screening     Status: None   Collection Time: 01/20/17  2:57 PM  Result Value Ref Range   MRSA by PCR NEGATIVE NEGATIVE    Comment:        The GeneXpert MRSA Assay (FDA approved for NASAL specimens only), is one component of a comprehensive MRSA colonization surveillance program. It is not intended to diagnose MRSA infection nor to guide or monitor treatment for MRSA infections.   Lactic acid, plasma     Status: None   Collection Time: 01/20/17  3:59 PM  Result Value Ref Range   Lactic Acid, Venous 1.7 0.5 - 1.9 mmol/L  Basic metabolic panel     Status: Abnormal   Collection Time: 01/21/17  4:32 AM  Result Value Ref Range   Sodium 142 135 - 145 mmol/L   Potassium 3.5 3.5 - 5.1 mmol/L   Chloride 112 (H) 101 - 111 mmol/L   CO2 24 22 - 32 mmol/L   Glucose, Bld 127 (H) 65 - 99 mg/dL   BUN 10 6 - 20 mg/dL   Creatinine, Ser 0.65 0.44 - 1.00 mg/dL   Calcium 9.2 8.9 - 10.3 mg/dL   GFR calc non Af Amer >60 >60 mL/min   GFR calc Af Amer >60 >60 mL/min    Comment: (NOTE) The eGFR has been calculated using the CKD EPI equation. This calculation has not been validated in all clinical situations. eGFR's persistently <60 mL/min signify possible Chronic Kidney Disease.    Anion gap 6 5 - 15  CBC     Status: Abnormal   Collection Time: 01/21/17  4:32 AM  Result Value Ref Range   WBC 14.0 (H) 4.0 - 10.5 K/uL   RBC  2.93 (L) 3.87 - 5.11 MIL/uL   Hemoglobin 8.1 (L) 12.0 - 15.0 g/dL   HCT 26.1 (L) 36.0 - 46.0 %   MCV 89.1 78.0 - 100.0 fL   MCH 27.6 26.0 - 34.0 pg   MCHC 31.0 30.0 - 36.0 g/dL   RDW 17.2 (H) 11.5 - 15.5 %   Platelets 207 150 - 400 K/uL   Dg Chest 2 View  Result Date: 01/19/2017 CLINICAL DATA:  Increased confusion over the past several days. EXAM: CHEST  2 VIEW  COMPARISON:  06/22/2015 FINDINGS: Stable cardiomegaly with aortic atherosclerosis. No acute pneumonic consolidation, effusion or pneumothorax. No overt pulmonary edema. Osteoarthritis of both AC and glenohumeral joints with high-riding humeral heads consistent with chronic rotator cuff tears. IMPRESSION: 1. No active cardiopulmonary disease. 2. Stable cardiomegaly with aortic atherosclerosis. 3. Osteoarthritis of both shoulders. Electronically Signed   By: Ashley Royalty M.D.   On: 01/19/2017 22:19   Dg Sacrum/coccyx  Result Date: 01/19/2017 CLINICAL DATA:  Decubitus ulcer EXAM: SACRUM AND COCCYX - 2+ VIEW COMPARISON:  CT from 06/12/2012. FINDINGS: Sclerosis about both SI joints consistent with osteoarthritis. Surgical clips project over the mid pelvis. Soft tissue ulceration over the the dorsum of the mid sacrum. No conclusive evidence for acute osteomyelitis. Bones however are slightly demineralized in appearance limiting assessment. There is lower lumbar degenerative disc disease from L4 through S1. IMPRESSION: 1. Lower lumbar spondylosis with degenerative disc disease. 2. Decubitus ulcer overlying the dorsum of the mid sacrum without definite radiographic evidence of acute osteomyelitis. Electronically Signed   By: Ashley Royalty M.D.   On: 01/19/2017 22:21    Assessment/Plan Sacral decubitus ulcer - XR: Decubitus ulcer overlying the dorsum of the mid sacrum without definite radiographic evidence of acute osteomyelitis - bedside debridement could help to get rid of necrotic tissue - will need to discuss with family prior to any procedure Sepsis secondary to wound infection of above - lactic acid trending down, WBC 14.0 from 15.8, afebrile, not tachycardic Metabolic encephalopathy  Dementia with behavioral disturbance PAF Hx of colon cancer Probably protein calorie malnutrition DNR status   FEN - DYS 2 diet VTE - SCDs, hold next dose lovenox for possible bedside procedure ID - falgyl (8/26),  aztreonam (8/26), cefepime (8/26>>), vanc (8/26>>)  Plan: Likely bedside debridement tomorrow if family consents. Will need to hold lovenox tomorrow.   Brigid Re, [facility] Surgery 01/21/2017, 1:10 PM Pager: [phone] Consults: [phone] Mon-Fri 7:00 am-4:30 pm Sat-Sun 7:00 am-11:30 am

## 2017-01-22 LAB — IRON AND TIBC
Iron: 8 ug/dL — ABNORMAL LOW (ref 28–170)
Saturation Ratios: 6 % — ABNORMAL LOW (ref 10.4–31.8)
TIBC: 125 ug/dL — ABNORMAL LOW (ref 250–450)
UIBC: 117 ug/dL

## 2017-01-22 LAB — BASIC METABOLIC PANEL
Anion gap: 6 (ref 5–15)
BUN: 9 mg/dL (ref 6–20)
CALCIUM: 8.9 mg/dL (ref 8.9–10.3)
CO2: 25 mmol/L (ref 22–32)
CREATININE: 0.64 mg/dL (ref 0.44–1.00)
Chloride: 107 mmol/L (ref 101–111)
Glucose, Bld: 113 mg/dL — ABNORMAL HIGH (ref 65–99)
Potassium: 3.2 mmol/L — ABNORMAL LOW (ref 3.5–5.1)
SODIUM: 138 mmol/L (ref 135–145)

## 2017-01-22 LAB — FOLATE: FOLATE: 11 ng/mL (ref >5.9)

## 2017-01-22 LAB — CBC
HCT: 22.3 % — ABNORMAL LOW (ref 36.0–46.0)
Hemoglobin: 7.1 g/dL — ABNORMAL LOW (ref 12.0–15.0)
MCH: 27.7 pg (ref 26.0–34.0)
MCHC: 31.8 g/dL (ref 30.0–36.0)
MCV: 87.1 fL (ref 78.0–100.0)
PLATELETS: 214 10*3/uL (ref 150–400)
RBC: 2.56 MIL/uL — AB (ref 3.87–5.11)
RDW: 17.4 % — AB (ref 11.5–15.5)
WBC: 16.3 10*3/uL — ABNORMAL HIGH (ref 4.0–10.5)

## 2017-01-22 LAB — PREPARE RBC (CROSSMATCH)

## 2017-01-22 LAB — VITAMIN B12: Vitamin B-12: 301 pg/mL (ref 180–914)

## 2017-01-22 LAB — ABO/RH: ABO/RH(D): A POS

## 2017-01-22 LAB — FERRITIN: Ferritin: 206 ng/mL (ref 11–307)

## 2017-01-22 LAB — OCCULT BLOOD X 1 CARD TO LAB, STOOL: FECAL OCCULT BLD: NEGATIVE

## 2017-01-22 MED ORDER — SODIUM CHLORIDE 0.9 % IV SOLN
Freq: Once | INTRAVENOUS | Status: AC
  Administered 2017-01-22: 10:00:00 via INTRAVENOUS

## 2017-01-22 MED ORDER — COLLAGENASE 250 UNIT/GM EX OINT
TOPICAL_OINTMENT | Freq: Every day | CUTANEOUS | Status: DC
  Filled 2017-01-22: qty 90

## 2017-01-22 MED ORDER — DAKINS (1/4 STRENGTH) 0.125 % EX SOLN
Freq: Every day | CUTANEOUS | Status: DC
  Filled 2017-01-22: qty 473

## 2017-01-22 MED ORDER — POTASSIUM CHLORIDE CRYS ER 20 MEQ PO TBCR
40.0000 meq | EXTENDED_RELEASE_TABLET | Freq: Once | ORAL | Status: AC
  Administered 2017-01-22: 40 meq via ORAL
  Filled 2017-01-22: qty 2

## 2017-01-22 MED ORDER — FERROUS SULFATE 325 (65 FE) MG PO TABS
325.0000 mg | ORAL_TABLET | Freq: Every day | ORAL | Status: DC
  Administered 2017-01-23 – 2017-01-28 (×6): 325 mg via ORAL
  Filled 2017-01-22 (×6): qty 1

## 2017-01-22 MED ORDER — KCL IN DEXTROSE-NACL 10-5-0.45 MEQ/L-%-% IV SOLN
INTRAVENOUS | Status: DC
  Administered 2017-01-23: 02:00:00 via INTRAVENOUS
  Filled 2017-01-22: qty 1000

## 2017-01-22 NOTE — Progress Notes (Signed)
PROGRESS NOTE  Kathleen [name]  WCB:[id number] DOB: 07/22/1925 DOA: 01/19/2017 PCP: Jani Gravel, MD  Brief Narrative:   The patient is an 83 year old female with history of dementia with behavioral disturbance, atrial fibrillation, recurrent UTI and colon cancer who presents with increased confusion, refusal to eat, strong odor of urine and strong odor from her sacral decubitus ulcer. She has been bedbound for several weeks now.  She was found to be septic with fever, tachycardia, altered mentation. She was started on broad-spectrum antibiotics for suspected sacral decubitus ulcer infection.  She appears clinically improved.  General surgery performed bedside debridement on 8/28.    Assessment & Plan:   Active Problems:   History of colon cancer   Acute encephalopathy   Atrial fibrillation (HCC)   Dementia of Alzheimer's type with behavioral disturbance   Sacral decubitus ulcer   Sepsis ([place])   Hypokalemia   Hypoalbuminemia   Decubitus ulcer of coccyx, stage IV ([place])   Encounter for palliative care   Goals of care, counseling/discussion  Sepsis secondary to infection of her sacral decubitus ulcer.  Her chest x-ray and urinalysis were unremarkable. She has foul odor with drainage of her sacral ulcer.  X-ray did not demonstrate evidence of acute osteomyelitis sacrum.  Leukocytosis, fevers, and mentation improving. -  Lactic acid trended down with IV fluids -  D/c IV fluids -  Continue vancomycin and cefepime -  Appreciate Wound care consult -  General surgery assistance appreciated -  Palliative care consult appreciated, ongoing conversations about [place].  Pain control.    Pain, likely on sacrum -  continue tylenol and prn morphine/oxycodone per palliative care  Metabolic encephalopathy, able to answer questions this morning -  tx for sepsis as above -  Aspiration precautions and speech therapy evaluation, appreciate assistance -  Dysphagia 2 with thin   Dementia with behavioral  disturbance -  Continue namenda, seroquel, and aricept  Paroxysmal atrial fibrillation, no longer on anticoagulation due to falls risk - continue aspirin  History of colon cancer, stable  Severe protein calorie malnutrition -  Nutrition consultation appreciated -  Supplements  Normocytic anemia, hgb trending down, likely hemodilutional and due to sepsis, pattern suggestive of chronic disease plus possible iron deficiency, although occult stool is negative.   -  Transfuse 1 unit PRBC -  Start oral iron  -  Iron studies with low iron and sat saturation, B12 300, folate 11 -  TSH wnl -  Occult stool negative -  Repeat hgb in AM  Hypokalemia, oral potassium repletion  DVT prophylaxis:  Lovenox Code Status:  DNR/DO NOT INTUBATE Family Communication:  Patient alone Disposition Plan:  Likely to SNF versus home. Patient has multiple caretakers.  PT/OT assessments pending   Consultants:   Palliative care  Wound care  General surgery  Procedures:   None  Antimicrobials:  Anti-infectives    Start     Dose/Rate Route Frequency Ordered Stop   01/20/17 2200  vancomycin (VANCOCIN) 500 mg in sodium chloride 0.9 % 100 mL IVPB     500 mg 100 mL/hr over 60 Minutes Intravenous Every 12 hours 01/20/17 0638     01/20/17 1400  metroNIDAZOLE (FLAGYL) IVPB 500 mg  Status:  Discontinued     500 mg 100 mL/hr over 60 Minutes Intravenous Every 8 hours 01/20/17 0510 01/20/17 1244   01/20/17 1400  aztreonam (AZACTAM) 2 g in dextrose 5 % 50 mL IVPB  Status:  Discontinued     2 g 100  mL/hr over 30 Minutes Intravenous Every 8 hours 01/20/17 0542 01/20/17 0638   01/20/17 1400  aztreonam (AZACTAM) 2 GM IVPB  Status:  Discontinued     2 g 100 mL/hr over 30 Minutes Intravenous Every 8 hours 01/20/17 0638 01/20/17 1244   01/20/17 1400  ceFEPIme (MAXIPIME) 1 g in dextrose 5 % 50 mL IVPB     1 g 100 mL/hr over 30 Minutes Intravenous Every 12 hours 01/20/17 1257     01/20/17 0600  vancomycin  (VANCOCIN) IVPB 1000 mg/200 mL premix     1,000 mg 200 mL/hr over 60 Minutes Intravenous  Once 01/20/17 0335 01/20/17 1012   01/20/17 0345  aztreonam (AZACTAM) 2 g in dextrose 5 % 50 mL IVPB     2 g 100 mL/hr over 30 Minutes Intravenous  Once 01/20/17 0335 01/20/17 0508   01/20/17 0345  metroNIDAZOLE (FLAGYL) IVPB 500 mg     500 mg 100 mL/hr over 60 Minutes Intravenous  Once 01/20/17 0335 01/20/17 0903       Subjective:  Patient has dementia but able to answer limited ROS:  Denies pain, difficulty breathing, nausea.    Objective: Vitals:   01/22/17 1005 01/22/17 1315 01/22/17 1530 01/22/17 1545  BP: (!) 117/48 (!) 144/61 139/72 (!) 142/72  Pulse: 81 78 81 78  Resp: 20 18 15 16   Temp: 98 F (36.7 C) 98.4 F (36.9 C) 98 F (36.7 C) 98 F (36.7 C)  TempSrc: Axillary Axillary Axillary Axillary  SpO2: 100% 100% 98%   Weight:      Height:        Intake/Output Summary (Last 24 hours) at 01/22/17 1723 Last data filed at 01/22/17 1340  Gross per 24 hour  Intake          2016.25 ml  Output              300 ml  Net          1716.25 ml   Filed Weights   01/20/17 0331  Weight: 56 kg (123 lb 7.3 oz)    Examination:  General exam:  Adult female.  No acute distress.  HEENT:  NCAT, MMM Respiratory system: Clear to auscultation bilaterally Cardiovascular system: Regular rate and rhythm, normal S1/S2. No murmurs, rubs, gallops or clicks.  Warm extremities Gastrointestinal system:  Normal active bowel sounds, soft, mildly distended, TTP diffusely without rebound or guarding. MSK:  Normal tone and bulk, no lower extremity edema Neuro:  Grossly moves all extremities Sacrum and heels not observated today.  Heels in prevalon boots with allevyn  Data Reviewed: I have personally reviewed following labs and imaging studies  CBC:  Recent Labs Lab 01/19/17 2011 01/20/17 0814 01/21/17 0432 01/22/17 0354  WBC 14.6* 15.8* 14.0* 16.3*  NEUTROABS 12.8*  --   --   --   HGB 9.2* 8.5*  8.1* 7.1*  HCT 29.2* 27.2* 26.1* 22.3*  MCV 89.6 90.7 89.1 87.1  PLT 233 218 207 782   Basic Metabolic Panel:  Recent Labs Lab 01/19/17 2011 01/20/17 0814 01/21/17 0432 01/22/17 0354  NA 139 141 142 138  K 3.3* 3.6 3.5 3.2*  CL 105 109 112* 107  CO2 27 24 24 25   GLUCOSE 116* 106* 127* 113*  BUN 11 11 10 9   CREATININE 0.76 0.66 0.65 0.64  CALCIUM 9.2 8.9 9.2 8.9  MG  --  1.4*  --   --   PHOS  --  2.9  --   --  GFR: Estimated Creatinine Clearance: 42.1 mL/min (by C-G formula based on SCr of 0.64 mg/dL). Liver Function Tests:  Recent Labs Lab 01/19/17 2011 01/20/17 0814  AST 24 22  ALT 14 13*  ALKPHOS 74 76  BILITOT 1.0 1.0  PROT 6.9 6.9  ALBUMIN 1.9* 1.8*   No results for input(s): LIPASE, AMYLASE in the last 168 hours. No results for input(s): AMMONIA in the last 168 hours. Coagulation Profile: No results for input(s): INR, PROTIME in the last 168 hours. Cardiac Enzymes: No results for input(s): CKTOTAL, CKMB, CKMBINDEX, TROPONINI in the last 168 hours. BNP (last 3 results) No results for input(s): PROBNP in the last 8760 hours. HbA1C: No results for input(s): HGBA1C in the last 72 hours. CBG: No results for input(s): GLUCAP in the last 168 hours. Lipid Profile: No results for input(s): CHOL, HDL, LDLCALC, TRIG, CHOLHDL, LDLDIRECT in the last 72 hours. Thyroid Function Tests:  Recent Labs  01/20/17 0814  TSH 0.807   Anemia Panel:  Recent Labs  01/22/17 0354  VITAMINB12 301  FOLATE 11.0  FERRITIN 206  TIBC 125*  IRON 8*   Urine analysis:    Component Value Date/Time   COLORURINE YELLOW 01/19/2017 2000   APPEARANCEUR CLEAR 01/19/2017 2000   LABSPEC 1.020 01/19/2017 2000   PHURINE 6.0 01/19/2017 2000   GLUCOSEU NEGATIVE 01/19/2017 2000   HGBUR NEGATIVE 01/19/2017 2000   [place] NEGATIVE 01/19/2017 2000   [place] NEGATIVE 01/19/2017 2000   PROTEINUR 30 (A) 01/19/2017 2000   UROBILINOGEN 0.2 06/12/2013 0632   NITRITE NEGATIVE  01/19/2017 2000   LEUKOCYTESUR NEGATIVE 01/19/2017 2000   Sepsis Labs: @LABRCNTIP (procalcitonin:4,lacticidven:4)  ) Recent Results (from the past 240 hour(s))  Urine culture     Status: Abnormal   Collection Time: 01/19/17  8:00 PM  Result Value Ref Range Status   Specimen Description URINE, CATHETERIZED  Final   Special Requests NONE  Final   Culture MULTIPLE SPECIES PRESENT, SUGGEST RECOLLECTION (A)  Final   Report Status 01/21/2017 FINAL  Final  Blood culture (routine x 2)     Status: None (Preliminary result)   Collection Time: 01/19/17  8:50 PM  Result Value Ref Range Status   Specimen Description BLOOD BLOOD LEFT FOREARM  Final   Special Requests IN PEDIATRIC BOTTLE Blood Culture adequate volume  Final   Culture   Final    NO GROWTH 2 DAYS Performed at [facility], [place] [address], [place], [place] [address]    Report Status PENDING  Incomplete  Blood culture (routine x 2)     Status: None (Preliminary result)   Collection Time: 01/19/17  9:00 PM  Result Value Ref Range Status   Specimen Description BLOOD RIGHT ARM UPPER  Final   Special Requests IN PEDIATRIC BOTTLE Blood Culture adequate volume  Final   Culture   Final    NO GROWTH 2 DAYS Performed at [facility], [place] [address] [place],  [address]    Report Status PENDING  Incomplete  MRSA PCR Screening     Status: None   Collection Time: 01/20/17  2:57 PM  Result Value Ref Range Status   MRSA by PCR NEGATIVE NEGATIVE Final    Comment:        The GeneXpert MRSA Assay (FDA approved for NASAL specimens only), is one component of a comprehensive MRSA colonization surveillance program. It is not intended to diagnose MRSA infection nor to guide or monitor treatment for MRSA infections.  Radiology Studies: No results found.   Scheduled Meds: . collagenase   Topical Daily  . donepezil  10 mg Oral QHS  . [START ON 01/23/2017] enoxaparin (LOVENOX) injection  40 mg Subcutaneous Q24H    . feeding supplement (ENSURE ENLIVE)  237 mL Oral BID BM  . folic acid  1 mg Oral Daily  . mouth rinse  15 mL Mouth Rinse BID  . memantine  10 mg Oral BID  . multivitamin  15 mL Oral Daily  . QUEtiapine  37.5 mg Oral QHS  . sodium hypochlorite   Irrigation Daily  . thiamine  100 mg Oral Daily   Continuous Infusions: . ceFEPime (MAXIPIME) IV Stopped (01/22/17 1410)  . vancomycin 500 mg (01/22/17 1335)     LOS: 2 days    Time spent: 30 min    Janece Canterbury, MD [facility] Pager [phone]  If 7PM-7AM, please contact night-coverage [email or web address] Password [facility] 01/22/2017, 5:23 PM

## 2017-01-22 NOTE — Progress Notes (Signed)
Daily Progress Note   Patient Name: CHARLYE SPARE       Date: 01/22/2017 DOB: Nov 07, 1925  Age: 83 y.o. MRN#: [id number] Attending Physician: Janece Canterbury, MD Primary Care Physician: Jani Gravel, MD Admit Date: 01/19/2017  Reason for Consultation/Follow-up: Establishing goals of care  Subjective: Ms. Ceniceros appears to have pain this morning as she grimaces and attempts to roll over in bed, she is resting in a kinair bed. She says "Oh my Lord" several times. She was unable to answer any questions. Nurse working on IV access for blood administration this morning. Plans for wound debridement this afternoon.   Met with patient's daughter Neoma Laming and her husband Mariea Clonts this afternoon. Neoma Laming is optimistic that debridement will work and patient will recover. Mariea Clonts is asking if she will recover and what that recovery would look like.  Will check in with family tomorrow.   Length of Stay: 2  Current Medications: Scheduled Meds:  . donepezil  10 mg Oral QHS  . [START ON 01/23/2017] enoxaparin (LOVENOX) injection  40 mg Subcutaneous Q24H  . feeding supplement (ENSURE ENLIVE)  237 mL Oral BID BM  . folic acid  1 mg Oral Daily  . mouth rinse  15 mL Mouth Rinse BID  . memantine  10 mg Oral BID  . multivitamin  15 mL Oral Daily  . QUEtiapine  37.5 mg Oral QHS  . thiamine  100 mg Oral Daily    Continuous Infusions: . ceFEPime (MAXIPIME) IV Stopped (01/21/17 2241)  . vancomycin Stopped (01/21/17 2305)    PRN Meds: acetaminophen **OR** acetaminophen, aspirin EC, bisacodyl, morphine injection, ondansetron **OR** ondansetron (ZOFRAN) IV, oxyCODONE, polyethylene glycol  Physical Exam  Constitutional: She appears distressed.  Appears to have pain.   Eyes: EOM are normal.  Pulmonary/Chest: Effort  normal. No respiratory distress.  Neurological: She is alert.  Confused and unable to answer questions           Thin frail lady, moaning at times Shallow clear respirations   Vital Signs: BP (!) 117/48   Pulse 81   Temp 98 F (36.7 C) (Axillary)   Resp 20   Ht '5\' 9"'$  (1.753 m)   Wt 56 kg (123 lb 7.3 oz)   SpO2 100%   BMI 18.23 kg/m  SpO2: SpO2: 100 % O2 Device: O2 Device:  Not Delivered O2 Flow Rate: O2 Flow Rate (L/min): 3 L/min  Intake/output summary:   Intake/Output Summary (Last 24 hours) at 01/22/17 1158 Last data filed at 01/22/17 1001  Gross per 24 hour  Intake          2126.25 ml  Output              350 ml  Net          1776.25 ml   LBM: Last BM Date: 01/21/17 Baseline Weight: Weight: 56 kg (123 lb 7.3 oz) Most recent weight: Weight: 56 kg (123 lb 7.3 oz)       Palliative Assessment/Data: 20%    Flowsheet Rows     Most Recent Value  Intake Tab  Referral Department  Hospitalist  Unit at Time of Referral  Med/Surg Unit  Palliative Care Primary Diagnosis  Other (Comment) [dementia, sacral decub, decline. ]  Palliative Care Type  New Palliative care  Reason for referral  Clarify Goals of Care, Counsel Regarding Hospice  Date first seen by Palliative Care  01/20/17  Clinical Assessment  Palliative Performance Scale Score  20%  Pain Max last 24 hours  4  Pain Min Last 24 hours  3  Dyspnea Max Last 24 Hours  3  Dyspnea Min Last 24 hours  2  Nausea Max Last 24 Hours  3  Nausea Min Last 24 Hours  2  Anxiety Max Last 24 Hours  3  Anxiety Min Last 24 Hours  2  Psychosocial & Spiritual Assessment  Palliative Care Outcomes  Patient/Family meeting held?  Yes  Who was at the meeting?  daughter Puja Caffey [facility] agent.   Palliative Care Outcomes  Clarified goals of care      Patient Active Problem List   Diagnosis Date Noted  . Decubitus ulcer of coccyx, stage IV ([place])   . Encounter for palliative care   . Goals of care, counseling/discussion   .  Sacral decubitus ulcer 01/19/2017  . Sepsis ([facility]) 01/19/2017  . Hypokalemia 01/19/2017  . Hypoalbuminemia 01/19/2017  . Acute encephalopathy 06/12/2013  . Hypothermia 06/12/2013  . Atrial fibrillation ([place]) 06/12/2013  . Dementia of Alzheimer's type with behavioral disturbance 06/12/2013  . Mild dehydration 06/12/2013  . Encephalopathy acute 06/12/2012  . Fever 06/12/2012  . UTI (lower urinary tract infection) 06/12/2012  . History of colon cancer 06/12/2012    Palliative Care Assessment & Plan   Patient Profile: Patient is a 83 year old lady with a past medical history significant for advanced Alzheimer's dementia who lives at home with her daughter. She essentially has [facility]. It is noted that for the past 2-3 weeks or so the patient has had diminishing oral intake has become more bedbound. She has underlying atrial fibrillation. She has been admitted with encephalopathy and stage IV decubitus ulcer   Assessment: Ms. Philbrick appears to have pain this morning and is repositioning herself. Per primary RN, she has been resting comfortably up to this point in which nurses are working with her, and has not required pain medicaiton.  She is continuing antibiotics which have been adjusted for infection. Her WBC's have increased since yesterday.   Recommendations/Plan:  Wound debridement today. Will continue to reach out to the daughter.     Oxycodone '5mg'$  q 4 hours PRN for moderate pain and Morphine IV '1mg'$  q 3 hours PRN for severe pain added.   Encouraged primary nurse to use medications as needed for pain.   Goals of Care and  Additional Recommendations:  Limitations on Scope of Treatment: DNR  Code Status:    Code Status Orders        Start     Ordered   01/20/17 0336  Do not attempt resuscitation (DNR)  Continuous    Question Answer Comment  In the event of cardiac or respiratory ARREST Do not call a "code blue"   In the event of cardiac or respiratory ARREST Do not  perform Intubation, CPR, defibrillation or ACLS   In the event of cardiac or respiratory ARREST Use medication by any route, position, wound care, and other measures to relive pain and suffering. May use oxygen, suction and manual treatment of airway obstruction as needed for comfort.      01/20/17 0335    Code Status History    Date Active Date Inactive Code Status Order ID Comments User Context   06/12/2013  9:01 AM 06/13/2013  2:36 PM Full Code [id number]  Barton Dubois, MD Inpatient   06/12/2012 11:35 AM 06/15/2012  7:16 PM Full Code [id number]  Jeannine Kitten, RN Inpatient    Advance Directive Documentation     Most Recent Value  Type of Advance Directive  Healthcare Power of Attorney  Pre-existing out of facility DNR order (yellow form or pink MOST form)  -  "MOST" Form in Place?  -       Prognosis:   < 6 months Depends on PO intake and resolution of infection. Overall poor prognosis and failure to thrive.   Discharge Planning:  To Be Determined Home with hospice care vs SNF at this time.    Thank you for allowing the Palliative Medicine Team to assist in the care of this patient.   Time In: 11:30 Time Out: 12:18 Total Time 47 minutes Prolonged Time Billed No       Greater than 50%  of this time was spent counseling and coordinating care related to the above assessment and plan.  Asencion Gowda, NP  Please contact Palliative Medicine Team phone at [phone] for questions and concerns.   I met today with Ms. Lurlean Nanny family in conjunction with Asencion Gowda.  See above for details of encounter.  Plan for bedside debridement this afternoon.  Micheline Rough, MD [place] Team [phone]

## 2017-01-22 NOTE — Progress Notes (Signed)
PT Cancellation Note  Patient Details Name: Kathleen [name] MRN: [id number] DOB: Feb 01, 1926   Cancelled Treatment:    Reason Eval/Treat Not Completed: Medical issues which prohibited therapy. C/M-low hgb-getting transfusion per RN. Also, per surgery, bedside debridement   Weston Anna, MPT Pager: [phone]

## 2017-01-22 NOTE — Progress Notes (Signed)
OT Cancellation Note  Patient Details Name: Kathleen [name] MRN: [id number] DOB: 1926-02-20   Cancelled Treatment:    Reason Eval/Treat Not Completed: Other (comment)   Pt getting blood as well as a debridement  this day. Will check on next day or OT. Kari Baars, [place]  Payton Mccallum D 01/22/2017, 11:53 AM

## 2017-01-22 NOTE — Progress Notes (Signed)
Pt going to surgery will meet with family tomorrow concerning discharge plans.

## 2017-01-22 NOTE — Op Note (Signed)
Preop Dx:  Necrotic sacral decubitus ulcer  Postop Dx:  Same  Procedure:  Debridement of necrotic sacral decubitus ulcer.  Surgeon:  Zella Richer  Technique:  Informed consent obtained from family.  Area prepped with Betadine.  Necrotic skin, subcutaneous tissue and muscle debrided.  Wound packed with saline moistened gauze.  1.  Progress note or procedure note with a detailed description of the procedure-see above  2.  Tool used for debridement (curette, scapel, etc.)  scapel and scissors  3.  Frequency of surgical debridement.   Daily or qod  4.  Measurement of total devitalized tissue (wound surface) before and after surgical debridement.   8 cm by 6 cm before and after  5.  Area and depth of devitalized tissue removed from wound.  8 cm by 6 cm by 1.5 cm  6.  Blood loss and description of tissue removed.  Skin, subcutaneous tissue and muscle; minimal blood lossj  7.  Evidence of the progress of the wound's response to treatment.  A.  Current wound volume (current dimensions and depth).  8 cm by 6 cm by 1.5 cm  B.  Presence (and extent of) of infection.  extensive  C.  Presence (and extent of) of non viable tissue.  90%  D.  Other material in the wound that is expected to inhibit healing.  bone  8.  Was there any viable tissue removed (measurements): no

## 2017-01-23 DIAGNOSIS — R509 Fever, unspecified: Secondary | ICD-10-CM

## 2017-01-23 LAB — TYPE AND SCREEN
ABO/RH(D): A POS
ANTIBODY SCREEN: NEGATIVE
UNIT DIVISION: 0
UNIT DIVISION: 0

## 2017-01-23 LAB — BASIC METABOLIC PANEL
Anion gap: 8 (ref 5–15)
BUN: 8 mg/dL (ref 6–20)
CALCIUM: 9.4 mg/dL (ref 8.9–10.3)
CO2: 24 mmol/L (ref 22–32)
CREATININE: 0.72 mg/dL (ref 0.44–1.00)
Chloride: 106 mmol/L (ref 101–111)
GFR calc Af Amer: >60 mL/min (ref >60)
GLUCOSE: 104 mg/dL — AB (ref 65–99)
Potassium: 3.6 mmol/L (ref 3.5–5.1)
Sodium: 138 mmol/L (ref 135–145)

## 2017-01-23 LAB — CBC
HCT: 30.1 % — ABNORMAL LOW (ref 36.0–46.0)
Hemoglobin: 10.2 g/dL — ABNORMAL LOW (ref 12.0–15.0)
MCH: 29.1 pg (ref 26.0–34.0)
MCHC: 33.9 g/dL (ref 30.0–36.0)
MCV: 86 fL (ref 78.0–100.0)
PLATELETS: 241 10*3/uL (ref 150–400)
RBC: 3.5 MIL/uL — AB (ref 3.87–5.11)
RDW: 16.8 % — AB (ref 11.5–15.5)
WBC: 16.2 10*3/uL — ABNORMAL HIGH (ref 4.0–10.5)

## 2017-01-23 LAB — BPAM RBC
BLOOD PRODUCT EXPIRATION DATE: 201809142359
Blood Product Expiration Date: 201809142359
ISSUE DATE / TIME: 201808280942
ISSUE DATE / TIME: 201808281528
UNIT TYPE AND RH: 6200
UNIT TYPE AND RH: 6200

## 2017-01-23 LAB — VANCOMYCIN, TROUGH: VANCOMYCIN TR: 7 ug/mL — AB (ref 15–20)

## 2017-01-23 MED ORDER — VANCOMYCIN HCL IN DEXTROSE 750-5 MG/150ML-% IV SOLN
750.0000 mg | Freq: Two times a day (BID) | INTRAVENOUS | Status: DC
  Administered 2017-01-24: 750 mg via INTRAVENOUS
  Filled 2017-01-23 (×2): qty 150

## 2017-01-23 MED ORDER — DAKINS (1/4 STRENGTH) 0.125 % EX SOLN
Freq: Two times a day (BID) | CUTANEOUS | Status: AC
  Administered 2017-01-23 – 2017-01-26 (×5)
  Filled 2017-01-23: qty 473

## 2017-01-23 MED ORDER — DAKINS (1/4 STRENGTH) 0.125 % EX SOLN
Freq: Two times a day (BID) | CUTANEOUS | Status: DC
  Administered 2017-01-23: 11:00:00
  Filled 2017-01-23: qty 473

## 2017-01-23 MED ORDER — COLLAGENASE 250 UNIT/GM EX OINT
TOPICAL_OINTMENT | Freq: Two times a day (BID) | CUTANEOUS | Status: DC
  Administered 2017-01-26 – 2017-01-28 (×5): via TOPICAL
  Filled 2017-01-23: qty 90

## 2017-01-23 MED ORDER — COLLAGENASE 250 UNIT/GM EX OINT
TOPICAL_OINTMENT | Freq: Two times a day (BID) | CUTANEOUS | Status: DC
  Administered 2017-01-23: 11:00:00 via TOPICAL
  Filled 2017-01-23: qty 90

## 2017-01-23 NOTE — Progress Notes (Signed)
Pharmacy Antibiotic Note  Kathleen [name] is a 83 y.o. female with increased confusion and decreased oral intake admitted on 01/19/2017 with sepsis. Likely secondary to decubitus ulcer Pharmacy has been consulted for azactam and vancomycin dosing.  Plan: Continue Cefepime 1g IV q12h VT today is 7 mcg/ml. Increase vancomycin 750 mg IV q12h VT=15-20 mg/L F/u scr/cultures/levels  Height: 5\' 9"  (175.3 cm) Weight: 123 lb 7.3 oz (56 kg) IBW/kg (Calculated) : 66.2  Temp (24hrs), Avg:98.4 F (36.9 C), Min:98 F (36.7 C), Max:99.7 F (37.6 C)   Recent Labs Lab 01/19/17 2011 01/19/17 2106 01/20/17 0814 01/20/17 1126 01/20/17 1559 01/21/17 0432 01/22/17 0354 01/23/17 0356 01/23/17 0554 01/23/17 1250  WBC 14.6*  --  15.8*  --   --  14.0* 16.3* 16.2*  --   --   CREATININE 0.76  --  0.66  --   --  0.65 0.64  --  0.72  --   LATICACIDVEN  --  1.54 2.0* 2.3* 1.7  --   --   --   --   --   VANCOTROUGH  --   --   --   --   --   --   --   --   --  7*    Estimated Creatinine Clearance: 42.1 mL/min (by C-G formula based on SCr of 0.72 mg/dL).    Allergies  Allergen Reactions  . Penicillins Other (See Comments)    Pt family and pt does not know the reaction. Per records, she has tolerated Rocephin.    Antimicrobials this admission: 8/26 azactam >> 8/26 8/26 flagyl >> 8/26 8/26 vancomycin >> 8/26 cefepime >>  Dose adjustments this admission: 8/29 VT is 26mcg/ml on vancomycin 500 mgf IV q12. Inc to 750 mg q12h  Microbiology results: 8/25 BCx: NGTD 8/25 UCx: multiple species, suggest recollection 8/26 MRSA PCR: negative  Thank you for allowing pharmacy to be a part of this patient's care.   Royetta Asal, PharmD, BCPS Pager [phone] 01/23/2017 2:55 PM

## 2017-01-23 NOTE — Progress Notes (Signed)
HYDROTHERAPY EVALUATION       01/23/17 1200  Subjective Assessment  Subjective "ohh..don't do that"  Patient and Family Stated Goals (pt unable to state)  Date of Onset (present on admission)  Evaluation and Treatment  Evaluation and Treatment Procedures Explained to Patient/Family Yes  Evaluation and Treatment Procedures Patient unable to consent due to mental status  Pressure Injury 01/23/17 Stage IV - Full thickness tissue loss with exposed bone, tendon or muscle. *Physical Therapy Hydrotherapy*  Date First Assessed/Time First Assessed: 01/23/17 1130   Location: Sacrum  Staging: Stage IV - Full thickness tissue loss with exposed bone, tendon or muscle.  Wound Description (Comments): *Physical Therapy Hydrotherapy*  Present on Admission: Yes  Dressing Type ABD;Moist to moist Dakin's (day 1 with PT)  Dressing Changed  Dressing Change Frequency Twice a day  State of Healing Non-healing  Site / Wound Assessment Black;Brown;Painful  % Wound base Red or Granulating 0%  % Wound base Black/Eschar 95%  % Wound base Other/Granulation Tissue (Comment) 5% (bone, fibrous tissue)  Wound Length (cm) 8 cm  Wound Width (cm) 4 cm  Wound Depth (cm) 2 cm  Tunneling (cm) (@ 5:00 and 7:00-5.5cm; @ 12:00-4cm)  Margins Unattached edges (unapproximated)  Drainage Amount Minimal  Drainage Description Odor  Treatment Debridement (Selective);Hydrotherapy (Pulse lavage) (Dakin's soaked gauze packing)  Hydrotherapy  Pulsed lavage therapy - wound location sacrum  Pulsed Lavage with Suction (psi) 8 psi  Pulsed Lavage with Suction - Normal Saline Used 1000 mL  Pulsed Lavage Tip Tip with splash shield  Wound Therapy - Assess/Plan/Recommendations  Wound Therapy - Clinical Statement 83 yo female admitted from home with necrotic sacral wound. S/P bedside debridement by surgery 01/22/17. Hx of dementia. On eval, wound is necrotic with a very foul odor. Spoke with RN about consulting with surgery for approval to  use Dakin's (at least for 3 days) before switching over to [place].   Wound Therapy - Functional Problem List Dementia with behavioral issues  Factors Delaying/Impairing Wound Healing Immobility;Infection - systemic/local;Incontinence  Hydrotherapy Plan Debridement;Dressing change;Patient/family education;Pulsatile lavage with suction  Wound Therapy - Frequency 6X / week  Wound Therapy - Current Recommendations Case manager/social work;Surgery consult;WOC nurse (surgery on case currently)  Wound Therapy - Follow Up Recommendations Skilled nursing facility;Home health RN;Wound [place] and debridement to decrease bioburden and facilitate wound healing  Wound Therapy Goals - Improve the function of patient's integumentary system by progressing the wound(s) through the phases of wound healing by:  Decrease Necrotic Tissue to 50  Decrease Necrotic Tissue - Progress Goal set today  Increase Granulation Tissue to 50  Increase Granulation Tissue - Progress Goal set today  Goals/treatment plan/discharge plan were made with and agreed upon by patient/family No, Patient unable to participate in goals/treatment/discharge plan and family unavailable  Time For Goal Achievement 2 weeks  Wound Therapy - Potential for Goals Poor    Weston Anna, MPT [phone]

## 2017-01-23 NOTE — Progress Notes (Signed)
Daughter wanted to reheat pt lunch that has been sitting at the bedside. Explained to the pat daughter that it is against policy to reheat food once it enters the patient's room, thoroughly explained to the pt's daighter the rationale as to why food can not be heated once it has entered the pt room. Pt daughter will be deferred to leadership, as she is not acknowledging understanding on the policy reviewed. SRP, RN

## 2017-01-23 NOTE — Progress Notes (Signed)
Spoke with PA Brooke, concerning wound care orders, orders will be adjusted per PT recommendation. SRP, RN

## 2017-01-23 NOTE — Progress Notes (Signed)
PROGRESS NOTE  Kathleen [name]  ZOX:[id number] DOB: 1926-01-27 DOA: 01/19/2017   PCP: Jani Gravel, MD  Brief Narrative:  83 year old female with history of dementia with behavioral disturbance, atrial fibrillation, recurrent UTI and colon cancer who presents with increased confusion, refusal to eat, strong odor of urine and strong odor from her sacral decubitus ulcer. She has been bedbound for several weeks now.  She was found to be septic with fever, tachycardia, altered mentation. She was started on broad-spectrum antibiotics for suspected sacral decubitus ulcer infection.  She appears clinically improved.  General surgery performed bedside debridement on 8/28.    Assessment & Plan:  Sepsis secondary to infection of her sacral decubitus ulcer.  Her chest x-ray and urinalysis were unremarkable. She has foul odor with drainage of her sacral ulcer.  X-ray did not demonstrate evidence of acute osteomyelitis sacrum. - Leukocytosis and overall mental status improving - IV fluids have been discontinued - Patient is on vancomycin and cefepime - Wound care team and Gen. surgery team following - Palliative care team also consulted and appreciate discussions and goals of care  Pain, likely on sacrum - Analgesia per palliative care team  Metabolic encephalopathy - More alert and able to answer some questions this morning - Aspiration precautions, speech therapy evaluation done, dysphagia 2, thin recommended  Dementia with behavioral disturbance -  Continue namenda, seroquel, and aricept  Paroxysmal atrial fibrillation, no longer on anticoagulation due to falls risk - continue aspirin  History of colon cancer - Overall stable  Severe protein calorie malnutrition -  Nutrition consultation appreciated - Continue supplement  Normocytic anemia - Pattern suggestive of chronic disease as underlying source with iron deficiency anemia - Has been transfused unit of blood 01/22/2017, hemoglobin up  to 10.2 - No evidence of active bleeding - Patient started on oral iron supplement - Iron studies with low iron and sat saturation, B12 300, folate 11 - TSH wnl - Occult stool negative  Hypokalemia - Supplemented and within normal limits  DVT prophylaxis:  Lovenox Code Status:  DNR/DO NOT INTUBATE Family Communication:  Patient at bedside Disposition Plan:  Likely skilled nursing facility in 1-2 days   Consultants:   Palliative care  Wound care  General surgery  Procedures:   None  Antimicrobials:  Anti-infectives    Start     Dose/Rate Route Frequency Ordered Stop   01/20/17 2200  vancomycin (VANCOCIN) 500 mg in sodium chloride 0.9 % 100 mL IVPB     500 mg 100 mL/hr over 60 Minutes Intravenous Every 12 hours 01/20/17 0638     01/20/17 1400  metroNIDAZOLE (FLAGYL) IVPB 500 mg  Status:  Discontinued     500 mg 100 mL/hr over 60 Minutes Intravenous Every 8 hours 01/20/17 0510 01/20/17 1244   01/20/17 1400  aztreonam (AZACTAM) 2 g in dextrose 5 % 50 mL IVPB  Status:  Discontinued     2 g 100 mL/hr over 30 Minutes Intravenous Every 8 hours 01/20/17 0542 01/20/17 0638   01/20/17 1400  aztreonam (AZACTAM) 2 GM IVPB  Status:  Discontinued     2 g 100 mL/hr over 30 Minutes Intravenous Every 8 hours 01/20/17 0638 01/20/17 1244   01/20/17 1400  ceFEPIme (MAXIPIME) 1 g in dextrose 5 % 50 mL IVPB     1 g 100 mL/hr over 30 Minutes Intravenous Every 12 hours 01/20/17 1257     01/20/17 0600  vancomycin (VANCOCIN) IVPB 1000 mg/200 mL premix     1,000 mg  200 mL/hr over 60 Minutes Intravenous  Once 01/20/17 0335 01/20/17 1012   01/20/17 0345  aztreonam (AZACTAM) 2 g in dextrose 5 % 50 mL IVPB     2 g 100 mL/hr over 30 Minutes Intravenous  Once 01/20/17 0335 01/20/17 0508   01/20/17 0345  metroNIDAZOLE (FLAGYL) IVPB 500 mg     500 mg 100 mL/hr over 60 Minutes Intravenous  Once 01/20/17 0335 01/20/17 0903       Subjective: Patient denies any concerns this  morning.  Objective: Vitals:   01/22/17 1545 01/22/17 1806 01/22/17 2015 01/23/17 0523  BP: (!) 142/72 (!) 151/78 (!) 148/67 (!) 158/86  Pulse: 78 78 82 88  Resp: 16 14 16 18   Temp: 98 F (36.7 C) 98 F (36.7 C) 99.7 F (37.6 C) 98.4 F (36.9 C)  TempSrc: Axillary Axillary Axillary Axillary  SpO2: 98% 98% 96% 96%  Weight:      Height:        Intake/Output Summary (Last 24 hours) at 01/23/17 1127 Last data filed at 01/23/17 0758  Gross per 24 hour  Intake          1763.25 ml  Output              725 ml  Net          1038.25 ml   Filed Weights   01/20/17 0331  Weight: 56 kg (123 lb 7.3 oz)    Physical Exam  Constitutional: Appears calm, not in any acute distress CVS: RRR, S1/S2 +, no murmurs, no gallops, no carotid bruit.  Pulmonary: Effort and breath sounds normal, no stridor Abdominal: Soft. BS +,  no distension, mild tenderness in epigastric area, no rebound or guarding.   Data Reviewed: I have personally reviewed following labs and imaging studies  CBC:  Recent Labs Lab 01/19/17 2011 01/20/17 0814 01/21/17 0432 01/22/17 0354 01/23/17 0356  WBC 14.6* 15.8* 14.0* 16.3* 16.2*  NEUTROABS 12.8*  --   --   --   --   HGB 9.2* 8.5* 8.1* 7.1* 10.2*  HCT 29.2* 27.2* 26.1* 22.3* 30.1*  MCV 89.6 90.7 89.1 87.1 86.0  PLT 233 218 207 214 440   Basic Metabolic Panel:  Recent Labs Lab 01/19/17 2011 01/20/17 0814 01/21/17 0432 01/22/17 0354 01/23/17 0554  NA 139 141 142 138 138  K 3.3* 3.6 3.5 3.2* 3.6  CL 105 109 112* 107 106  CO2 27 24 24 25 24   GLUCOSE 116* 106* 127* 113* 104*  BUN 11 11 10 9 8   CREATININE 0.76 0.66 0.65 0.64 0.72  CALCIUM 9.2 8.9 9.2 8.9 9.4  MG  --  1.4*  --   --   --   PHOS  --  2.9  --   --   --    Liver Function Tests:  Recent Labs Lab 01/19/17 2011 01/20/17 0814  AST 24 22  ALT 14 13*  ALKPHOS 74 76  BILITOT 1.0 1.0  PROT 6.9 6.9  ALBUMIN 1.9* 1.8*   Anemia Panel:  Recent Labs  01/22/17 0354  VITAMINB12 301   FOLATE 11.0  FERRITIN 206  TIBC 125*  IRON 8*   Urine analysis:    Component Value Date/Time   COLORURINE YELLOW 01/19/2017 2000   APPEARANCEUR CLEAR 01/19/2017 2000   LABSPEC 1.020 01/19/2017 2000   PHURINE 6.0 01/19/2017 2000   GLUCOSEU NEGATIVE 01/19/2017 2000   HGBUR NEGATIVE 01/19/2017 2000   BILIRUBINUR NEGATIVE 01/19/2017 2000   [place] NEGATIVE 01/19/2017  2000   PROTEINUR 30 (A) 01/19/2017 2000   UROBILINOGEN 0.2 06/12/2013 0632   NITRITE NEGATIVE 01/19/2017 2000   LEUKOCYTESUR NEGATIVE 01/19/2017 2000    Recent Results (from the past 240 hour(s))  Urine culture     Status: Abnormal   Collection Time: 01/19/17  8:00 PM  Result Value Ref Range Status   Specimen Description URINE, CATHETERIZED  Final   Special Requests NONE  Final   Culture MULTIPLE SPECIES PRESENT, SUGGEST RECOLLECTION (A)  Final   Report Status 01/21/2017 FINAL  Final  Blood culture (routine x 2)     Status: None (Preliminary result)   Collection Time: 01/19/17  8:50 PM  Result Value Ref Range Status   Specimen Description BLOOD BLOOD LEFT FOREARM  Final   Special Requests IN PEDIATRIC BOTTLE Blood Culture adequate volume  Final   Culture   Final    NO GROWTH 3 DAYS Performed at [facility], [place] [address], [place], [place] [address]    Report Status PENDING  Incomplete  Blood culture (routine x 2)     Status: None (Preliminary result)   Collection Time: 01/19/17  9:00 PM  Result Value Ref Range Status   Specimen Description BLOOD RIGHT ARM UPPER  Final   Special Requests IN PEDIATRIC BOTTLE Blood Culture adequate volume  Final   Culture   Final    NO GROWTH 3 DAYS Performed at [facility], [place] [address], [place],  [address]    Report Status PENDING  Incomplete  MRSA PCR Screening     Status: None   Collection Time: 01/20/17  2:57 PM  Result Value Ref Range Status   MRSA by PCR NEGATIVE NEGATIVE Final    Comment:        The GeneXpert MRSA Assay (FDA approved  for NASAL specimens only), is one component of a comprehensive MRSA colonization surveillance program. It is not intended to diagnose MRSA infection nor to guide or monitor treatment for MRSA infections.     Radiology Studies: No results found.  Scheduled Meds: . collagenase   Topical BID  . donepezil  10 mg Oral QHS  . enoxaparin (LOVENOX) injection  40 mg Subcutaneous Q24H  . feeding supplement (ENSURE ENLIVE)  237 mL Oral BID BM  . ferrous sulfate  325 mg Oral Q breakfast  . folic acid  1 mg Oral Daily  . mouth rinse  15 mL Mouth Rinse BID  . memantine  10 mg Oral BID  . multivitamin  15 mL Oral Daily  . QUEtiapine  37.5 mg Oral QHS  . sodium hypochlorite   Irrigation BID  . thiamine  100 mg Oral Daily   Continuous Infusions: . ceFEPime (MAXIPIME) IV Stopped (01/23/17 0256)  . dextrose 5 % and 0.45 % NaCl with KCl 10 mEq/L 75 mL/hr at 01/23/17 0225  . vancomycin Stopped (01/23/17 0200)     LOS: 3 days   Time spent: 25 minutes  Faye Ramsay, MD [facility] Pager [phone]  If 7PM-7AM, please contact night-coverage [email or web address] Password [facility] 01/23/2017, 11:27 AM

## 2017-01-23 NOTE — Progress Notes (Signed)
  Speech Language Pathology T Name: Kathleen [name] MRN: [id number] DOB: Oct 06, 1925 Today's Date: 01/23/2017 Time: 1539-     Reviewed chart. Pt currently asleep. Daughter at bedside who denied coughing, difficulty masticating Dys 2 textures or thin liquids since initial assessment 8/26. She prefers to continue with Dys 2 texture given pt's dementia and current status. No further ST needed at this time.             Houston Siren 01/23/2017, 3:42 PM  Orbie Pyo Colvin Caroli.Ed [employer] [phone]

## 2017-01-23 NOTE — Care Management Important Message (Signed)
Important Message  Patient Details  Name: Kathleen [name] MRN: [id number] Date of Birth: 10/21/1925   Medicare Important Message Given:  Yes    Kerin Salen 01/23/2017, 10:55 AM

## 2017-01-23 NOTE — Progress Notes (Signed)
I checked in with patient's daughter Neoma Laming) today.  She denies questions or concerns.  Plan to continue with current plan for aggressive wound care.  Micheline Rough, MD [place] [facility] Team [phone]  NO CHARGE NOTE

## 2017-01-24 LAB — CBC
HCT: 31 % — ABNORMAL LOW (ref 36.0–46.0)
Hemoglobin: 10.2 g/dL — ABNORMAL LOW (ref 12.0–15.0)
MCH: 28.6 pg (ref 26.0–34.0)
MCHC: 32.9 g/dL (ref 30.0–36.0)
MCV: 86.8 fL (ref 78.0–100.0)
PLATELETS: 285 10*3/uL (ref 150–400)
RBC: 3.57 MIL/uL — ABNORMAL LOW (ref 3.87–5.11)
RDW: 16.7 % — AB (ref 11.5–15.5)
WBC: 14.2 10*3/uL — ABNORMAL HIGH (ref 4.0–10.5)

## 2017-01-24 LAB — BASIC METABOLIC PANEL
Anion gap: 8 (ref 5–15)
BUN: 9 mg/dL (ref 6–20)
CALCIUM: 9.2 mg/dL (ref 8.9–10.3)
CHLORIDE: 104 mmol/L (ref 101–111)
CO2: 24 mmol/L (ref 22–32)
CREATININE: 0.6 mg/dL (ref 0.44–1.00)
GFR calc Af Amer: >60 mL/min (ref >60)
GFR calc non Af Amer: >60 mL/min (ref >60)
GLUCOSE: 100 mg/dL — AB (ref 65–99)
Potassium: 3.2 mmol/L — ABNORMAL LOW (ref 3.5–5.1)
Sodium: 136 mmol/L (ref 135–145)

## 2017-01-24 MED ORDER — SULFAMETHOXAZOLE-TRIMETHOPRIM 800-160 MG PO TABS
1.0000 | ORAL_TABLET | Freq: Two times a day (BID) | ORAL | Status: DC
  Administered 2017-01-24 – 2017-01-28 (×9): 1 via ORAL
  Filled 2017-01-24 (×10): qty 1

## 2017-01-24 MED ORDER — POTASSIUM CHLORIDE CRYS ER 20 MEQ PO TBCR
40.0000 meq | EXTENDED_RELEASE_TABLET | Freq: Once | ORAL | Status: AC
  Administered 2017-01-24: 40 meq via ORAL
  Filled 2017-01-24: qty 2

## 2017-01-24 MED ORDER — ASPIRIN EC 81 MG PO TBEC
81.0000 mg | DELAYED_RELEASE_TABLET | Freq: Every day | ORAL | Status: DC
  Administered 2017-01-24 – 2017-01-28 (×5): 81 mg via ORAL
  Filled 2017-01-24 (×5): qty 1

## 2017-01-24 NOTE — Progress Notes (Signed)
HYDROTHERAPY TREATMENT     01/24/17 1300  Subjective Assessment  Subjective "hey honey"  Patient and Family Stated Goals (pt unable to state. family not present.)  Date of Onset (present on admission)  Evaluation and Treatment  Evaluation and Treatment Procedures Explained to Patient/Family Yes  Evaluation and Treatment Procedures Patient unable to consent due to mental status  Pressure Injury 01/23/17 Stage IV - Full thickness tissue loss with exposed bone, tendon or muscle. *Physical Therapy Hydrotherapy*  Date First Assessed/Time First Assessed: 01/23/17 1130   Location: Sacrum  Staging: Stage IV - Full thickness tissue loss with exposed bone, tendon or muscle.  Wound Description (Comments): *Physical Therapy Hydrotherapy*  Present on Admission: Yes  Dressing Type Gauze ;ABD pad (Dakin's soaked [place] (day 2 with PT)  Dressing Changed  Dressing Change Frequency Twice a day  State of Healing Non-healing  Site / Wound Assessment Black;Brown;Pink;Painful  % Wound base Red or Granulating 5%  % Wound base Black/Eschar 90%  % Wound base Other/Granulation Tissue (Comment) 5% (bone, fibrous tissue)  Peri-wound Assessment Maceration;Excoriated  Margins Unattached edges (unapproximated)  Drainage Amount Moderate  Drainage Description Odor  Treatment Debridement (Selective);Hydrotherapy (Pulse lavage) (Dakins soaked gauze packing)  Hydrotherapy  Pulsed lavage therapy - wound location sacrum  Pulsed Lavage with Suction (psi) 8 psi  Pulsed Lavage with Suction - Normal Saline Used 1000 mL  Pulsed Lavage Tip Tip with splash shield  Selective Debridement  Selective Debridement - Location sacrum  Selective Debridement - Tools Used Forceps;Scissors  Selective Debridement - Tissue Removed black and brown necrotic tissue  Wound Therapy - Assess/Plan/Recommendations  Wound Therapy - Clinical Statement 83 yo female admitted from home with necrotic sacral wound. S/P bedside debridement by surgery  01/22/17. Hx of dementia.   Wound Therapy - Functional Problem List Dementia with behavioral issues  Factors Delaying/Impairing Wound Healing Immobility;Infection - systemic/local;Incontinence  Hydrotherapy Plan Debridement;Dressing change;Patient/family education;Pulsatile lavage with suction  Wound Therapy - Frequency 6X / week  Wound Therapy - Current Recommendations Case manager/social work;Surgery consult;WOC nurse  Wound Therapy - Follow Up Recommendations Skilled nursing facility;Home health RN;Wound [place] and debridement to decrease bioburden and facilitate wound healing. Orders are for Dakins solution use until 9/1. On 9/1 will begin Santyl use.   Wound Therapy Goals - Improve the function of patient's integumentary system by progressing the wound(s) through the phases of wound healing by:  Decrease Necrotic Tissue to 50  Decrease Necrotic Tissue - Progress Progressing toward goal  Increase Granulation Tissue to 50  Increase Granulation Tissue - Progress Progressing toward goal  Goals/treatment plan/discharge plan were made with and agreed upon by patient/family No, Patient unable to participate in goals/treatment/discharge plan and family unavailable  Time For Goal Achievement 2 weeks  Wound Therapy - Potential for Goals Poor   Weston Anna, MPT [phone]

## 2017-01-24 NOTE — Progress Notes (Signed)
PROGRESS NOTE  Kathleen [name]  NWG:[id number] DOB: 01-Jun-1925 DOA: 01/19/2017   PCP: Jani Gravel, MD  Brief Narrative:  83 year old female with history of dementia with behavioral disturbance, atrial fibrillation, recurrent UTI and colon cancer who presents with increased confusion, refusal to eat, strong odor of urine and strong odor from her sacral decubitus ulcer. She has been bedbound for several weeks now.  She was found to be septic with fever, tachycardia, altered mentation. She was started on broad-spectrum antibiotics for suspected sacral decubitus ulcer infection.  She appears clinically improved.  General surgery performed bedside debridement on 8/28.    Assessment & Plan:  Sepsis secondary to infection of necrotic sacral decubitus ulcer, stage IV.  Her chest x-ray and urinalysis were unremarkable. She has foul odor with drainage of her sacral ulcer.  X-ray did not demonstrate evidence of acute osteomyelitis sacrum. - Leukocytosis and overall mental status improving - IV fluids have been discontinued - Patient is on vancomycin and cefepime - Wound care team and Gen. surgery teams consulted  - WBC is trending down  - Palliative care team also consulted and appreciate discussions and goals of care - needs to continue hydrotherapy, change ABX to bactrim   Pain, likely on sacrum - Analgesia per palliative care team  Metabolic encephalopathy - More alert and able to answer some questions this morning - Aspiration precautions, speech therapy evaluation done, dysphagia 2, thin recommended  Dementia with behavioral disturbance -  Continue namenda, seroquel, and aricept - mental status at baseline   Paroxysmal atrial fibrillation, no longer on anticoagulation due to falls risk - continue aspirin, scheduled   History of colon cancer - Overall stable  Severe protein calorie malnutrition - Nutrition consultation appreciated - cont supplementation   Normocytic anemia - Pattern  suggestive of chronic disease as underlying source with iron deficiency anemia - Has been transfused unit of blood 01/22/2017, hemoglobin stable ~10 - no evidence of active bleeding  - Patient started on oral iron supplement - Iron studies with low iron and sat saturation, B12 300, folate 11 - TSH wnl - Occult stool negative  Hypokalemia - supplement and repeat BMP in AM  DVT prophylaxis:  Lovenox Code Status:  DNR/DO NOT INTUBATE Family Communication:  No family at bedside  Disposition Plan:  SNF in AM if pt tolerating Bactrim   Consultants:   Palliative care  Wound care  General surgery  Procedures:   None  Antimicrobials:  Anti-infectives    Start     Dose/Rate Route Frequency Ordered Stop   01/24/17 0000  vancomycin (VANCOCIN) IVPB 750 mg/150 ml premix     750 mg 150 mL/hr over 60 Minutes Intravenous Every 12 hours 01/23/17 1453     01/20/17 2200  vancomycin (VANCOCIN) 500 mg in sodium chloride 0.9 % 100 mL IVPB  Status:  Discontinued     500 mg 100 mL/hr over 60 Minutes Intravenous Every 12 hours 01/20/17 0638 01/23/17 1454   01/20/17 1400  metroNIDAZOLE (FLAGYL) IVPB 500 mg  Status:  Discontinued     500 mg 100 mL/hr over 60 Minutes Intravenous Every 8 hours 01/20/17 0510 01/20/17 1244   01/20/17 1400  aztreonam (AZACTAM) 2 g in dextrose 5 % 50 mL IVPB  Status:  Discontinued     2 g 100 mL/hr over 30 Minutes Intravenous Every 8 hours 01/20/17 0542 01/20/17 0638   01/20/17 1400  aztreonam (AZACTAM) 2 GM IVPB  Status:  Discontinued     2 g 100  mL/hr over 30 Minutes Intravenous Every 8 hours 01/20/17 0638 01/20/17 1244   01/20/17 1400  ceFEPIme (MAXIPIME) 1 g in dextrose 5 % 50 mL IVPB     1 g 100 mL/hr over 30 Minutes Intravenous Every 12 hours 01/20/17 1257     01/20/17 0600  vancomycin (VANCOCIN) IVPB 1000 mg/200 mL premix     1,000 mg 200 mL/hr over 60 Minutes Intravenous  Once 01/20/17 0335 01/20/17 1012   01/20/17 0345  aztreonam (AZACTAM) 2 g in dextrose  5 % 50 mL IVPB     2 g 100 mL/hr over 30 Minutes Intravenous  Once 01/20/17 0335 01/20/17 0508   01/20/17 0345  metroNIDAZOLE (FLAGYL) IVPB 500 mg     500 mg 100 mL/hr over 60 Minutes Intravenous  Once 01/20/17 0335 01/20/17 0903      Subjective: No concerns this AM.  Objective: Vitals:   01/23/17 0523 01/23/17 1234 01/23/17 2051 01/24/17 0530  BP: (!) 158/86 (!) 144/82 (!) 142/64 (!) 159/74  Pulse: 88 94 95 84  Resp: 18 18 18 16   Temp: 98.4 F (36.9 C) 98.1 F (36.7 C) 99.5 F (37.5 C) 97.8 F (36.6 C)  TempSrc: Axillary Oral Axillary Axillary  SpO2: 96% 97% 97% 95%  Weight:      Height:        Intake/Output Summary (Last 24 hours) at 01/24/17 1223 Last data filed at 01/24/17 0531  Gross per 24 hour  Intake              250 ml  Output              400 ml  Net             -150 ml   Filed Weights   01/20/17 0331  Weight: 56 kg (123 lb 7.3 oz)   Physical Exam  Constitutional: Appears calm, NAD, mittens in place  CVS: RRR, S1/S2 +, no murmurs, no gallops, no carotid bruit.  Pulmonary: Effort and breath sounds normal, no stridor, rhonchi, wheezes, rales.  Abdominal: Soft. BS +,  no distension, tenderness, rebound or guarding.   Data Reviewed: I have personally reviewed following labs and imaging studies  CBC:  Recent Labs Lab 01/19/17 2011 01/20/17 2130 01/21/17 0432 01/22/17 0354 01/23/17 0356 01/24/17 0419  WBC 14.6* 15.8* 14.0* 16.3* 16.2* 14.2*  NEUTROABS 12.8*  --   --   --   --   --   HGB 9.2* 8.5* 8.1* 7.1* 10.2* 10.2*  HCT 29.2* 27.2* 26.1* 22.3* 30.1* 31.0*  MCV 89.6 90.7 89.1 87.1 86.0 86.8  PLT 233 218 207 214 241 865   Basic Metabolic Panel:  Recent Labs Lab 01/20/17 0814 01/21/17 0432 01/22/17 0354 01/23/17 0554 01/24/17 0419  NA 141 142 138 138 136  K 3.6 3.5 3.2* 3.6 3.2*  CL 109 112* 107 106 104  CO2 24 24 25 24 24   GLUCOSE 106* 127* 113* 104* 100*  BUN 11 10 9 8 9   CREATININE 0.66 0.65 0.64 0.72 0.60  CALCIUM 8.9 9.2 8.9  9.4 9.2  MG 1.4*  --   --   --   --   PHOS 2.9  --   --   --   --    Liver Function Tests:  Recent Labs Lab 01/19/17 2011 01/20/17 0814  AST 24 22  ALT 14 13*  ALKPHOS 74 76  BILITOT 1.0 1.0  PROT 6.9 6.9  ALBUMIN 1.9* 1.8*   Anemia Panel:  Recent Labs  01/22/17 0354  VITAMINB12 301  FOLATE 11.0  FERRITIN 206  TIBC 125*  IRON 8*   Urine analysis:    Component Value Date/Time   COLORURINE YELLOW 01/19/2017 2000   APPEARANCEUR CLEAR 01/19/2017 2000   LABSPEC 1.020 01/19/2017 2000   PHURINE 6.0 01/19/2017 2000   GLUCOSEU NEGATIVE 01/19/2017 2000   HGBUR NEGATIVE 01/19/2017 2000   BILIRUBINUR NEGATIVE 01/19/2017 2000   [place] NEGATIVE 01/19/2017 2000   PROTEINUR 30 (A) 01/19/2017 2000   UROBILINOGEN 0.2 06/12/2013 0632   NITRITE NEGATIVE 01/19/2017 2000   LEUKOCYTESUR NEGATIVE 01/19/2017 2000    Recent Results (from the past 240 hour(s))  Urine culture     Status: Abnormal   Collection Time: 01/19/17  8:00 PM  Result Value Ref Range Status   Specimen Description URINE, CATHETERIZED  Final   Special Requests NONE  Final   Culture MULTIPLE SPECIES PRESENT, SUGGEST RECOLLECTION (A)  Final   Report Status 01/21/2017 FINAL  Final  Blood culture (routine x 2)     Status: None (Preliminary result)   Collection Time: 01/19/17  8:50 PM  Result Value Ref Range Status   Specimen Description BLOOD BLOOD LEFT FOREARM  Final   Special Requests IN PEDIATRIC BOTTLE Blood Culture adequate volume  Final   Culture   Final    NO GROWTH 4 DAYS Performed at [facility], [place] [address], [place], [place] [address]    Report Status PENDING  Incomplete  Blood culture (routine x 2)     Status: None (Preliminary result)   Collection Time: 01/19/17  9:00 PM  Result Value Ref Range Status   Specimen Description BLOOD RIGHT ARM UPPER  Final   Special Requests IN PEDIATRIC BOTTLE Blood Culture adequate volume  Final   Culture   Final    NO GROWTH 4 DAYS Performed at [facility], [place] [address], [place], [place] [address]    Report Status PENDING  Incomplete  MRSA PCR Screening     Status: None   Collection Time: 01/20/17  2:57 PM  Result Value Ref Range Status   MRSA by PCR NEGATIVE NEGATIVE Final    Comment:        The GeneXpert MRSA Assay (FDA approved for NASAL specimens only), is one component of a comprehensive MRSA colonization surveillance program. It is not intended to diagnose MRSA infection nor to guide or monitor treatment for MRSA infections.     Radiology Studies: No results found.  Scheduled Meds: . aspirin EC  81 mg Oral Daily  . [START ON 01/26/2017] collagenase   Topical BID  . donepezil  10 mg Oral QHS  . enoxaparin (LOVENOX) injection  40 mg Subcutaneous Q24H  . feeding supplement (ENSURE ENLIVE)  237 mL Oral BID BM  . ferrous sulfate  325 mg Oral Q breakfast  . folic acid  1 mg Oral Daily  . mouth rinse  15 mL Mouth Rinse BID  . memantine  10 mg Oral BID  . multivitamin  15 mL Oral Daily  . QUEtiapine  37.5 mg Oral QHS  . sodium hypochlorite   Irrigation BID  . thiamine  100 mg Oral Daily   Continuous Infusions: . ceFEPime (MAXIPIME) IV Stopped (01/24/17 0238)  . vancomycin Stopped (01/24/17 0100)     LOS: 4 days   Time spent: 25 minutes   Faye Ramsay, MD [facility] Pager [phone]  If 7PM-7AM, please contact night-coverage [email or web address] Password [facility] 01/24/2017, 12:23  PM

## 2017-01-24 NOTE — Progress Notes (Signed)
Physical Therapy Treatment Patient Details Name: Kathleen [name] MRN: [id number] DOB: Aug 14, 1925 Today's Date: 01/24/2017    History of Present Illness The patient is an 83 year old female with history of dementia with behavioral disturbance, atrial fibrillation, recurrent UTI and colon cancer who presents with increased confusion, refusal to eat, strong odor of urine and strong odor from her sacral decubitus ulcer. She has been bedbound for several weeks now.  She was found to be septic with fever, tachycardia, altered mentation. She was started on broad-spectrum antibiotics for suspected sacral decubitus ulcer infection    PT Comments    Rolling to L and R side on today. Pt resistive to movement. She did not follow any commands.    Follow Up Recommendations  SNF     Equipment Recommendations   (TBD at next venue)    Recommendations for Other Services       Precautions / Restrictions Restrictions Weight Bearing Restrictions: No    Mobility  Bed Mobility Overal bed mobility: Needs Assistance Bed Mobility: Rolling Rolling: Mod assist;+2 for physical assistance;+2 for safety/equipment         General bed mobility comments: Rolling  x 2. Pt resistive to movement on today. Utilized bedpad to aid with positioning. She did not follow commands.   Transfers                    Ambulation/Gait                 Stairs            Wheelchair Mobility    Modified Rankin (Stroke Patients Only)       Balance                                            Cognition Arousal/Alertness: Awake/alert Behavior During Therapy: WFL for tasks assessed/performed Overall Cognitive Status: Impaired/Different from baseline                                        Exercises      General Comments        Pertinent Vitals/Pain Faces Pain Scale: Hurts even more Pain Location: bottom Pain Intervention(s): Limited activity within  patient's tolerance    Home Living                      Prior Function            PT Goals (current goals can now be found in the care plan section) Progress towards PT goals: Not progressing toward goals - comment    Frequency    Min 2X/week      PT Plan Frequency needs to be updated    Co-evaluation              AM-PAC PT "6 Clicks" Daily Activity  Outcome Measure  Difficulty turning over in bed (including adjusting bedclothes, sheets and blankets)?: Unable Difficulty moving from lying on back to sitting on the side of the bed? : Unable Difficulty sitting down on and standing up from a chair with arms (e.g., wheelchair, bedside commode, etc,.)?: Unable Help needed moving to and from a bed to chair (including a wheelchair)?: Total Help needed walking in hospital room?: Total Help needed climbing 3-5 steps  with a railing? : Total 6 Click Score: 6    End of Session   Activity Tolerance: Patient limited by pain Patient left: in bed;with call bell/phone within reach;with bed alarm set   PT Visit Diagnosis: Muscle weakness (generalized) (M62.81);Difficulty in walking, not elsewhere classified (R26.2);Other abnormalities of gait and mobility (R26.89)     Time: 1150-1200 PT Time Calculation (min) (ACUTE ONLY): 10 min  Charges:  $Therapeutic Activity: 8-22 mins                    G Codes:          Weston Anna, MPT Pager: [phone]

## 2017-01-24 NOTE — Progress Notes (Signed)
OT Cancellation Note  Patient Details Name: Kathleen [name] MRN: [id number] DOB: 06-12-1925   Cancelled Treatment:    Reason Eval/Treat Not Completed: Other (comment)  Spoke with PT.  Noted pt does not follow command and needs A with all ADL activity due to dementia.   Do no feel OT appropriate at this time. Will sign off  Kari Baars, [place]  Betsy Pries 01/24/2017, 11:08 AM

## 2017-01-24 NOTE — Progress Notes (Signed)
Patient ID: Kathleen [name], female   DOB: 08-05-1925, 83 y.o.   MRN: [id number]  [facility] Surgery Progress Note     Subjective: CC- sacral decubitus ulcer PT at bedside, day 2 of hydrotherapy and seems to be tolerating ok. Family met with palliative yesterday and desires to continue with aggressive wound care. WBC down to 14.2. Patient on cefepime and vancomycin.   Objective: Vital signs in last 24 hours: Temp:  [97.8 F (36.6 C)-99.5 F (37.5 C)] 97.8 F (36.6 C) (08/30 0530) Pulse Rate:  [84-95] 84 (08/30 0530) Resp:  [16-18] 16 (08/30 0530) BP: (142-159)/(64-82) 159/74 (08/30 0530) SpO2:  [95 %-97 %] 95 % (08/30 0530) Last BM Date: 01/22/17  Intake/Output from previous day: 08/29 0701 - 08/30 0700 In: 250 [IV Piggyback:250] Out: 700 [Urine:700] Intake/Output this shift: No intake/output data recorded.  PE: Gen:  Alert, NAD, pleasant HEENT: EOM's intact, pupils equal and round Pulm:  effort normal Ext:  No gross deformities, erythema, edema, or tenderness BUE/BLE  Psych: unable to assess Sacrum:      Lab Results:   Recent Labs  01/23/17 0356 01/24/17 0419  WBC 16.2* 14.2*  HGB 10.2* 10.2*  HCT 30.1* 31.0*  PLT 241 285   BMET  Recent Labs  01/23/17 0554 01/24/17 0419  NA 138 136  K 3.6 3.2*  CL 106 104  CO2 24 24  GLUCOSE 104* 100*  BUN 8 9  CREATININE 0.72 0.60  CALCIUM 9.4 9.2   PT/INR No results for input(s): LABPROT, INR in the last 72 hours. CMP     Component Value Date/Time   NA 136 01/24/2017 0419   K 3.2 (L) 01/24/2017 0419   CL 104 01/24/2017 0419   CO2 24 01/24/2017 0419   GLUCOSE 100 (H) 01/24/2017 0419   BUN 9 01/24/2017 0419   CREATININE 0.60 01/24/2017 0419   CALCIUM 9.2 01/24/2017 0419   PROT 6.9 01/20/2017 0814   ALBUMIN 1.8 (L) 01/20/2017 0814   AST 22 01/20/2017 0814   ALT 13 (L) 01/20/2017 0814   ALKPHOS 76 01/20/2017 0814   BILITOT 1.0 01/20/2017 0814   GFRNONAA >60 01/24/2017 0419   GFRAA >60 01/24/2017  0419   Lipase     Component Value Date/Time   LIPASE 16 06/12/2012 1310       Studies/Results: No results [name].  Anti-infectives: Anti-infectives    Start     Dose/Rate Route Frequency Ordered Stop   01/24/17 0000  vancomycin (VANCOCIN) IVPB 750 mg/150 ml premix     750 mg 150 mL/hr over 60 Minutes Intravenous Every 12 hours 01/23/17 1453     01/20/17 2200  vancomycin (VANCOCIN) 500 mg in sodium chloride 0.9 % 100 mL IVPB  Status:  Discontinued     500 mg 100 mL/hr over 60 Minutes Intravenous Every 12 hours 01/20/17 0638 01/23/17 1454   01/20/17 1400  metroNIDAZOLE (FLAGYL) IVPB 500 mg  Status:  Discontinued     500 mg 100 mL/hr over 60 Minutes Intravenous Every 8 hours 01/20/17 0510 01/20/17 1244   01/20/17 1400  aztreonam (AZACTAM) 2 g in dextrose 5 % 50 mL IVPB  Status:  Discontinued     2 g 100 mL/hr over 30 Minutes Intravenous Every 8 hours 01/20/17 0542 01/20/17 0638   01/20/17 1400  aztreonam (AZACTAM) 2 GM IVPB  Status:  Discontinued     2 g 100 mL/hr over 30 Minutes Intravenous Every 8 hours 01/20/17 0638 01/20/17 1244   01/20/17 1400  ceFEPIme (MAXIPIME) 1 g in dextrose 5 % 50 mL IVPB     1 g 100 mL/hr over 30 Minutes Intravenous Every 12 hours 01/20/17 1257     01/20/17 0600  vancomycin (VANCOCIN) IVPB 1000 mg/200 mL premix     1,000 mg 200 mL/hr over 60 Minutes Intravenous  Once 01/20/17 0335 01/20/17 1012   01/20/17 0345  aztreonam (AZACTAM) 2 g in dextrose 5 % 50 mL IVPB     2 g 100 mL/hr over 30 Minutes Intravenous  Once 01/20/17 0335 01/20/17 0508   01/20/17 0345  metroNIDAZOLE (FLAGYL) IVPB 500 mg     500 mg 100 mL/hr over 60 Minutes Intravenous  Once 01/20/17 0335 01/20/17 5056       Assessment/Plan Metabolic encephalopathy Dementia A fib not on anticoagulation H/o colon cancer Severe protein calorie malnutrition  Sacral decubitus ulcer - s/p bedside debridement 8/28 by Dr. Zella Richer - started hydrotherapy 8/29 - on cefepime and  vancomycin - WBC down to 14.2 - family met with palliative team and desires to continue with current plan for aggressive wound care   ID - currently on cefepime 8/26>>day#5 and vancomycin 8/26>>day#5 FEN - dysphagia 2 diet VTE - SCDs, lovenox Foley - external catheter Follow up - TBD  Plan - Continue hydrotherapy and BID wet to dry dressing changes. Continue antibiotics.   LOS: 4 days    Wellington Hampshire , [facility] Surgery 01/24/2017, 12:18 PM Pager: [phone] Consults: [phone] Mon-Fri 7:00 am-4:30 pm Sat-Sun 7:00 am-11:30 am

## 2017-01-25 LAB — CBC
HCT: 31.8 % — ABNORMAL LOW (ref 36.0–46.0)
HEMOGLOBIN: 10.8 g/dL — AB (ref 12.0–15.0)
MCH: 29.2 pg (ref 26.0–34.0)
MCHC: 34 g/dL (ref 30.0–36.0)
MCV: 85.9 fL (ref 78.0–100.0)
Platelets: 338 10*3/uL (ref 150–400)
RBC: 3.7 MIL/uL — ABNORMAL LOW (ref 3.87–5.11)
RDW: 16.7 % — ABNORMAL HIGH (ref 11.5–15.5)
WBC: 12.4 10*3/uL — ABNORMAL HIGH (ref 4.0–10.5)

## 2017-01-25 LAB — BASIC METABOLIC PANEL
ANION GAP: 5 (ref 5–15)
BUN: 9 mg/dL (ref 6–20)
CALCIUM: 9.1 mg/dL (ref 8.9–10.3)
CHLORIDE: 105 mmol/L (ref 101–111)
CO2: 26 mmol/L (ref 22–32)
CREATININE: 0.58 mg/dL (ref 0.44–1.00)
GFR calc Af Amer: >60 mL/min (ref >60)
GFR calc non Af Amer: >60 mL/min (ref >60)
GLUCOSE: 92 mg/dL (ref 65–99)
Potassium: 3.8 mmol/L (ref 3.5–5.1)
Sodium: 136 mmol/L (ref 135–145)

## 2017-01-25 LAB — CULTURE, BLOOD (ROUTINE X 2)
CULTURE: NO GROWTH
Culture: NO GROWTH
SPECIAL REQUESTS: ADEQUATE
Special Requests: ADEQUATE

## 2017-01-25 MED ORDER — OXYCODONE HCL 5 MG PO TABS: 5.0000 mg | ORAL_TABLET | ORAL | 0 refills | Status: DC | PRN

## 2017-01-25 NOTE — Progress Notes (Signed)
Daily Progress Note   Patient Name: Kathleen [name]       Date: 01/25/2017 DOB: 1926/01/21  Age: 83 y.o. MRN#: [id number] Attending Physician: Theodis Blaze, [name] Primary Care Physician: Jani Gravel, [name] Admit Date: 01/19/2017  Reason for Consultation/Follow-up: Establishing goals of care  Subjective: Kathleen [name] is more awake this morning.  She was unable to answer simple questions about pain (denies currently) but was otherwise unable to participate in conversations about plans moving forward.   No family at the bedside.  Length of Stay: 5  Current Medications: Scheduled Meds:  . aspirin EC  81 mg Oral Daily  . [START ON 01/26/2017] collagenase   Topical BID  . donepezil  10 mg Oral QHS  . enoxaparin (LOVENOX) injection  40 mg Subcutaneous Q24H  . feeding supplement (ENSURE ENLIVE)  237 mL Oral BID BM  . ferrous sulfate  325 mg Oral Q breakfast  . folic acid  1 mg Oral Daily  . mouth rinse  15 mL Mouth Rinse BID  . memantine  10 mg Oral BID  . multivitamin  15 mL Oral Daily  . QUEtiapine  37.5 mg Oral QHS  . sodium hypochlorite   Irrigation BID  . sulfamethoxazole-trimethoprim  1 tablet Oral Q12H  . thiamine  100 mg Oral Daily    Continuous Infusions:   PRN Meds: acetaminophen **OR** acetaminophen, bisacodyl, morphine injection, ondansetron **OR** ondansetron (ZOFRAN) IV, oxyCODONE, polyethylene glycol  Physical Exam  Constitutional:  No distress  Eyes: EOM are normal.  Pulmonary/Chest: Effort normal. No respiratory distress.  Neurological: She is alert.  Confused and unable to answer questions          Vital Signs: BP 128/60 (BP Location: Left Arm)   Pulse 78   Temp (!) 97.5 F (36.4 C) (Axillary)   Resp 18   Ht 5\' 9"  (1.753 m)   Wt 56 kg (123 lb 7.3 oz)   SpO2 92%    BMI 18.23 kg/m  SpO2: SpO2: 92 % O2 Device: O2 Device: Not Delivered O2 Flow Rate: O2 Flow Rate (L/min): 3 L/min  Intake/output summary:   Intake/Output Summary (Last 24 hours) at 01/25/17 1429 Last data filed at 01/25/17 1000  Gross per 24 hour  Intake  120 ml  Output                0 ml  Net              120 ml   LBM: Last BM Date: 01/22/17 Baseline Weight: Weight: 56 kg (123 lb 7.3 oz) Most recent weight: Weight: 56 kg (123 lb 7.3 oz)       Palliative Assessment/Data: 20%    Flowsheet Rows     Most Recent Value  Intake Tab  Referral Department  Hospitalist  Unit at Time of Referral  Med/Surg Unit  Palliative Care Primary Diagnosis  Other (Comment) [dementia, sacral decub, decline. ]  Palliative Care Type  New Palliative care  Reason for referral  Clarify Goals of Care, Counsel Regarding Hospice  Date first seen by Palliative Care  01/20/17  Clinical Assessment  Palliative Performance Scale Score  20%  Pain Max last 24 hours  4  Pain Min Last 24 hours  3  Dyspnea Max Last 24 Hours  3  Dyspnea Min Last 24 hours  2  Nausea Max Last 24 Hours  3  Nausea Min Last 24 Hours  2  Anxiety Max Last 24 Hours  3  Anxiety Min Last 24 Hours  2  Psychosocial & Spiritual Assessment  Palliative Care Outcomes  Patient/Family meeting held?  Yes  Who was at the meeting?  daughter Kathleen [name] [facility] agent.   Palliative Care Outcomes  Clarified goals of care      Patient Active Problem List   Diagnosis Date Noted  . Decubitus ulcer of coccyx, stage IV ([place])   . Encounter for palliative care   . Goals of care, counseling/discussion   . Sacral decubitus ulcer 01/19/2017  . Sepsis ([place]) 01/19/2017  . Hypokalemia 01/19/2017  . Hypoalbuminemia 01/19/2017  . Acute encephalopathy 06/12/2013  . Hypothermia 06/12/2013  . Atrial fibrillation ([place]) 06/12/2013  . Dementia of Alzheimer's type with behavioral disturbance 06/12/2013  . Mild dehydration 06/12/2013  .  Encephalopathy acute 06/12/2012  . Fever 06/12/2012  . UTI (lower urinary tract infection) 06/12/2012  . History of colon cancer 06/12/2012    Palliative Care Assessment & Plan   Patient Profile: Patient is a 83 year old lady with a past medical history significant for advanced Alzheimer's dementia who lives at home with her daughter. She essentially has [facility]. It is noted that for the past 2-3 weeks or so the patient has had diminishing oral intake has become more bedbound. She has underlying atrial fibrillation. She has been admitted with encephalopathy and stage IV decubitus ulcer  Recommendations/Plan:  Plan remains for aggressive wound management during conversations with family.  Attempted to call her daughter.  No answer and left VM.     Oxycodone 5mg  q 4 hours PRN for moderate pain and Morphine IV 1mg  q 3 hours PRN for severe pain while inpatient.  On discharge, would continue oxycodone 5mg  q4 hours prn and also ensure she has a dose 30 minutes prior to wound care.  Goals of Care and Additional Recommendations:  Limitations on Scope of Treatment: DNR  Code Status:    Code Status Orders        Start     Ordered   01/20/17 0336  Do not attempt resuscitation (DNR)  Continuous    Question Answer Comment  In the event of cardiac or respiratory ARREST Do not call a "code blue"   In the event of cardiac or respiratory ARREST Do not  perform Intubation, CPR, defibrillation or ACLS   In the event of cardiac or respiratory ARREST Use medication by any route, position, wound care, and other measures to relive pain and suffering. May use oxygen, suction and manual treatment of airway obstruction as needed for comfort.      01/20/17 0335    Code Status History    Date Active Date Inactive Code Status Order ID Comments User Context   06/12/2013  9:01 AM 06/13/2013  2:36 PM Full Code [id number]  Kathleen [name] Inpatient   06/12/2012 11:35 AM 06/15/2012  7:16 PM Full Code  [id number]  Kathleen [name] Inpatient    Advance Directive Documentation     Most Recent Value  Type of Advance Directive  Healthcare Power of Attorney  Pre-existing out of facility DNR order (yellow form or pink MOST form)  -  "MOST" Form in Place?  -       Prognosis:   Unable to determine Depends on PO intake and clinical course with infection and sacral wound. Overall poor prognosis and failure to thrive.   Discharge Planning:  To Be Determined.  Plan is aggressive wound care including hydrotherapy.  If desire to continue hydrotherapy, SW notes that she can do this at [facility].   Thank you for allowing the Palliative Medicine Team to assist in the care of this patient.   Total time: 20 minutes    Greater than 50%  of this time was spent counseling and coordinating care related to the above assessment and plan.  Micheline Rough, [name]  Please contact Palliative Medicine Team phone at [phone] for questions and concerns.   Micheline Rough, [name] [place] Team [phone]

## 2017-01-25 NOTE — Progress Notes (Signed)
PROGRESS NOTE  Kathleen [name]  UMP:[id number] DOB: 03-18-1926 DOA: 01/19/2017   PCP: Jani Gravel, MD  Brief Narrative:  83 year old female with history of dementia with behavioral disturbance, atrial fibrillation, recurrent UTI and colon cancer who presents with increased confusion, refusal to eat, strong odor of urine and strong odor from her sacral decubitus ulcer. She has been bedbound for several weeks now.  She was found to be septic with fever, tachycardia, altered mentation. She was started on broad-spectrum antibiotics for suspected sacral decubitus ulcer infection.  She appears clinically improved.  General surgery performed bedside debridement on 8/28.    Assessment & Plan:  Sepsis secondary to infection of necrotic sacral decubitus ulcer, stage IV.  Her chest x-ray and urinalysis were unremarkable. She has foul odor with drainage of her sacral ulcer.  X-ray did not demonstrate evidence of acute osteomyelitis sacrum. - Leukocytosis and overall mental status improving - IV fluids have been discontinued - Patient is on vancomycin and cefepime - Wound care team and Gen. surgery teams consulted  - WBC trending down  - Palliative care team also consulted and appreciate discussions and goals of care - needs to continue hydrotherapy, changed ABX to bactrim 8/30, cont same regimen   Pain, likely on sacrum - cont analgesia   Metabolic encephalopathy - More alert and able to answer some questions this morning - Aspiration precautions, speech therapy evaluation done, dysphagia 2, thin recommended  Dementia with behavioral disturbance - Continue namenda, seroquel, and aricept - mental status stable   Paroxysmal atrial fibrillation, no longer on anticoagulation due to falls risk - continue aspirin, scheduled   History of colon cancer - Overall stable  Severe protein calorie malnutrition - Nutrition consultation appreciated - cont supplementation   Normocytic anemia - Pattern  suggestive of chronic disease as underlying source with iron deficiency anemia - Has been transfused unit of blood 01/22/2017, hemoglobin stable ~10 - no evidence of active bleeding  - Patient started on oral iron supplement - Iron studies with low iron and sat saturation, B12 300, folate 11 - TSH wnl - Occult stool negative  Hypokalemia - supplemented  DVT prophylaxis:  Lovenox Code Status:  DNR/DO NOT INTUBATE Family Communication:  No family at bedside, awaiting call from family to finalize decision  Disposition Plan:  SNF in AM   Consultants:   Palliative care  Wound care  General surgery  Procedures:   None  Antimicrobials:  Anti-infectives    Start     Dose/Rate Route Frequency Ordered Stop   01/24/17 1400  sulfamethoxazole-trimethoprim (BACTRIM DS,SEPTRA DS) 800-160 MG per tablet 1 tablet     1 tablet Oral Every 12 hours 01/24/17 1232     01/24/17 0000  vancomycin (VANCOCIN) IVPB 750 mg/150 ml premix  Status:  Discontinued     750 mg 150 mL/hr over 60 Minutes Intravenous Every 12 hours 01/23/17 1453 01/24/17 1232   01/20/17 2200  vancomycin (VANCOCIN) 500 mg in sodium chloride 0.9 % 100 mL IVPB  Status:  Discontinued     500 mg 100 mL/hr over 60 Minutes Intravenous Every 12 hours 01/20/17 0638 01/23/17 1454   01/20/17 1400  metroNIDAZOLE (FLAGYL) IVPB 500 mg  Status:  Discontinued     500 mg 100 mL/hr over 60 Minutes Intravenous Every 8 hours 01/20/17 0510 01/20/17 1244   01/20/17 1400  aztreonam (AZACTAM) 2 g in dextrose 5 % 50 mL IVPB  Status:  Discontinued     2 g 100 mL/hr over 30  Minutes Intravenous Every 8 hours 01/20/17 0542 01/20/17 0638   01/20/17 1400  aztreonam (AZACTAM) 2 GM IVPB  Status:  Discontinued     2 g 100 mL/hr over 30 Minutes Intravenous Every 8 hours 01/20/17 0638 01/20/17 1244   01/20/17 1400  ceFEPIme (MAXIPIME) 1 g in dextrose 5 % 50 mL IVPB  Status:  Discontinued     1 g 100 mL/hr over 30 Minutes Intravenous Every 12 hours 01/20/17  1257 01/24/17 1232   01/20/17 0600  vancomycin (VANCOCIN) IVPB 1000 mg/200 mL premix     1,000 mg 200 mL/hr over 60 Minutes Intravenous  Once 01/20/17 0335 01/20/17 1012   01/20/17 0345  aztreonam (AZACTAM) 2 g in dextrose 5 % 50 mL IVPB     2 g 100 mL/hr over 30 Minutes Intravenous  Once 01/20/17 0335 01/20/17 0508   01/20/17 0345  metroNIDAZOLE (FLAGYL) IVPB 500 mg     500 mg 100 mL/hr over 60 Minutes Intravenous  Once 01/20/17 0335 01/20/17 0903      Subjective: No concerns this AM.   Objective: Vitals:   01/24/17 0530 01/24/17 1311 01/24/17 2142 01/25/17 0646  BP: (!) 159/74 140/86 (!) 156/71 (!) 151/75  Pulse: 84 88 83 75  Resp: 16 15 (!) 22 20  Temp: 97.8 F (36.6 C) 98.4 F (36.9 C) 98 F (36.7 C) 97.7 F (36.5 C)  TempSrc: Axillary Oral Axillary Oral  SpO2: 95% 96% 95% 93%  Weight:      Height:       No intake or output data in the 24 hours ending 01/25/17 1313 Filed Weights   01/20/17 0331  Weight: 56 kg (123 lb 7.3 oz)   Physical Exam  Constitutional: Appears NAD, confused, mittens in place  CVS: RRR, S1/S2 +, no murmurs, no gallops, no carotid bruit.  Pulmonary: Effort and breath sounds normal, no stridor Abdominal: Soft. BS +,  no distension, tenderness, rebound or guarding.   Data Reviewed: I have personally reviewed following labs and imaging studies  CBC:  Recent Labs Lab 01/19/17 2011  01/21/17 0432 01/22/17 0354 01/23/17 0356 01/24/17 0419 01/25/17 0416  WBC 14.6*  < > 14.0* 16.3* 16.2* 14.2* 12.4*  NEUTROABS 12.8*  --   --   --   --   --   --   HGB 9.2*  < > 8.1* 7.1* 10.2* 10.2* 10.8*  HCT 29.2*  < > 26.1* 22.3* 30.1* 31.0* 31.8*  MCV 89.6  < > 89.1 87.1 86.0 86.8 85.9  PLT 233  < > 207 214 241 285 338  < > = values in this interval not displayed. Basic Metabolic Panel:  Recent Labs Lab 01/20/17 0814 01/21/17 0432 01/22/17 0354 01/23/17 0554 01/24/17 0419 01/25/17 0416  NA 141 142 138 138 136 136  K 3.6 3.5 3.2* 3.6 3.2* 3.8    CL 109 112* 107 106 104 105  CO2 24 24 25 24 24 26   GLUCOSE 106* 127* 113* 104* 100* 92  BUN 11 10 9 8 9 9   CREATININE 0.66 0.65 0.64 0.72 0.60 0.58  CALCIUM 8.9 9.2 8.9 9.4 9.2 9.1  MG 1.4*  --   --   --   --   --   PHOS 2.9  --   --   --   --   --    Liver Function Tests:  Recent Labs Lab 01/19/17 2011 01/20/17 0814  AST 24 22  ALT 14 13*  ALKPHOS 74 76  BILITOT  1.0 1.0  PROT 6.9 6.9  ALBUMIN 1.9* 1.8*   Urine analysis:    Component Value Date/Time   COLORURINE YELLOW 01/19/2017 2000   APPEARANCEUR CLEAR 01/19/2017 2000   LABSPEC 1.020 01/19/2017 2000   PHURINE 6.0 01/19/2017 2000   GLUCOSEU NEGATIVE 01/19/2017 2000   HGBUR NEGATIVE 01/19/2017 2000   BILIRUBINUR NEGATIVE 01/19/2017 2000   [place] NEGATIVE 01/19/2017 2000   PROTEINUR 30 (A) 01/19/2017 2000   UROBILINOGEN 0.2 06/12/2013 0632   NITRITE NEGATIVE 01/19/2017 2000   LEUKOCYTESUR NEGATIVE 01/19/2017 2000    Recent Results (from the past 240 hour(s))  Urine culture     Status: Abnormal   Collection Time: 01/19/17  8:00 PM  Result Value Ref Range Status   Specimen Description URINE, CATHETERIZED  Final   Special Requests NONE  Final   Culture MULTIPLE SPECIES PRESENT, SUGGEST RECOLLECTION (A)  Final   Report Status 01/21/2017 FINAL  Final  Blood culture (routine x 2)     Status: None (Preliminary result)   Collection Time: 01/19/17  8:50 PM  Result Value Ref Range Status   Specimen Description BLOOD BLOOD LEFT FOREARM  Final   Special Requests IN PEDIATRIC BOTTLE Blood Culture adequate volume  Final   Culture   Final    NO GROWTH 4 DAYS Performed at [facility], [place] [address] [place], [place] [address]    Report Status PENDING  Incomplete  Blood culture (routine x 2)     Status: None (Preliminary result)   Collection Time: 01/19/17  9:00 PM  Result Value Ref Range Status   Specimen Description BLOOD RIGHT ARM UPPER  Final   Special Requests IN PEDIATRIC BOTTLE Blood Culture adequate  volume  Final   Culture   Final    NO GROWTH 4 DAYS Performed at [facility], [place] [address], [place], [place] [address]    Report Status PENDING  Incomplete  MRSA PCR Screening     Status: None   Collection Time: 01/20/17  2:57 PM  Result Value Ref Range Status   MRSA by PCR NEGATIVE NEGATIVE Final    Comment:        The GeneXpert MRSA Assay (FDA approved for NASAL specimens only), is one component of a comprehensive MRSA colonization surveillance program. It is not intended to diagnose MRSA infection nor to guide or monitor treatment for MRSA infections.     Radiology Studies: No results found.  Scheduled Meds: . aspirin EC  81 mg Oral Daily  . [START ON 01/26/2017] collagenase   Topical BID  . donepezil  10 mg Oral QHS  . enoxaparin (LOVENOX) injection  40 mg Subcutaneous Q24H  . feeding supplement (ENSURE ENLIVE)  237 mL Oral BID BM  . ferrous sulfate  325 mg Oral Q breakfast  . folic acid  1 mg Oral Daily  . mouth rinse  15 mL Mouth Rinse BID  . memantine  10 mg Oral BID  . multivitamin  15 mL Oral Daily  . QUEtiapine  37.5 mg Oral QHS  . sodium hypochlorite   Irrigation BID  . sulfamethoxazole-trimethoprim  1 tablet Oral Q12H  . thiamine  100 mg Oral Daily   Continuous Infusions:   LOS: 5 days   Time spent:25 minutes   Faye Ramsay, MD [facility] Pager [phone]  If 7PM-7AM, please contact night-coverage [email or web address] Password [email or web address] 01/25/2017, 1:13 PM

## 2017-01-25 NOTE — Progress Notes (Signed)
HYDROTHERAPY TREATMENT      01/25/17 1200  Subjective Assessment  Subjective "hey honey"  Date of Onset (present on admission)  Evaluation and Treatment  Evaluation and Treatment Procedures Explained to Patient/Family Yes  Evaluation and Treatment Procedures Patient unable to consent due to mental status  Pressure Injury 01/23/17 Stage IV - Full thickness tissue loss with exposed bone, tendon or muscle. *Physical Therapy Hydrotherapy*  Date First Assessed/Time First Assessed: 01/23/17 1130   Location: Sacrum  Staging: Stage IV - Full thickness tissue loss with exposed bone, tendon or muscle.  Wound Description (Comments): *Physical Therapy Hydrotherapy*  Present on Admission: Yes  Dressing Type ABD;Gauze (Dakins soaked Kerlix (day 3 with PT)  Dressing Changed  Dressing Change Frequency Twice a day  State of Healing Non-healing  Site / Wound Assessment Black;Brown;Pink;Painful (bone, tissue)  % Wound base Red or Granulating 15%  % Wound base Black/Eschar 75%  % Wound base Other/Granulation Tissue (Comment) 10% (bone, other tissue)  Margins Unattached edges (unapproximated)  Drainage Amount Moderate  Drainage Description Odor;Serosanguineous  Treatment Debridement (Selective);Hydrotherapy (Pulse lavage) (Dakins soaked Kerlix packing)  Hydrotherapy  Pulsed lavage therapy - wound location sacrum  Pulsed Lavage with Suction (psi) 8 psi  Pulsed Lavage with Suction - Normal Saline Used 1000 mL  Pulsed Lavage Tip Tip with splash shield  Selective Debridement  Selective Debridement - Location sacrum  Selective Debridement - Tools Used Forceps;Scissors  Selective Debridement - Tissue Removed black and brown necrotic tissue  Wound Therapy - Assess/Plan/Recommendations  Wound Therapy - Clinical Statement 83 yo female admitted from home with necrotic sacral wound. S/P bedside debridement by surgery 01/22/17 and 01/25/17. Hx of dementia.   Wound Therapy - Functional Problem List Dementia  with behavioral issues  Factors Delaying/Impairing Wound Healing Immobility;Infection - systemic/local;Incontinence  Hydrotherapy Plan Debridement;Dressing change;Patient/family education;Pulsatile lavage with suction  Wound Therapy - Frequency 6X / week  Wound Therapy - Current Recommendations Case manager/social work;Surgery consult;WOC nurse  Wound Therapy - Follow Up Recommendations Skilled nursing facility;Home health RN;Wound Pearl and debridement to decrease bioburden and facilitate wound healing. Orders are for Dakins solution use until 9/1. On 9/1 will begin Santyl use.   Wound Therapy Goals - Improve the function of patient's integumentary system by progressing the wound(s) through the phases of wound healing by:  Decrease Necrotic Tissue to 50  Decrease Necrotic Tissue - Progress Progressing toward goal  Increase Granulation Tissue to 50  Increase Granulation Tissue - Progress Progressing toward goal  Goals/treatment plan/discharge plan were made with and agreed upon by patient/family No, Patient unable to participate in goals/treatment/discharge plan and family unavailable  Time For Goal Achievement 2 weeks  Wound Therapy - Potential for Goals Poor   Weston Anna, MPT [phone]

## 2017-01-25 NOTE — Progress Notes (Signed)
CSW left voicemail for patient daughter, to discuss pt. Disposition.   Kathrin Greathouse, Latanya Presser, MSW Clinical Social Worker 5E and Psychiatric Service Line [phone] 01/25/2017  12:35 PM

## 2017-01-25 NOTE — Progress Notes (Signed)
Nutrition Follow-up  DOCUMENTATION CODES:   Underweight, Severe malnutrition in context of chronic illness  INTERVENTION:   -Continue Ensure Enlive po BID, each supplement provides 350 kcal and 20 grams of protein -Continue Magic cup TID with meals, each supplement provides 290 kcal and 9 grams of protein -MVI daily -Encourage PO intake  NUTRITION DIAGNOSIS:   Malnutrition related to chronic illness, wound healing, lethargy/confusion as evidenced by severe depletion of body fat, severe depletion of muscle mass.  Ongoing  GOAL:   Patient will meet greater than or equal to 90% of their needs  Progressing  MONITOR:   PO intake, Supplement acceptance, Labs, Weight trends, Skin, I & O's  REASON FOR ASSESSMENT:   Consult Assessment of nutrition requirement/status  ASSESSMENT:   83 year old female with history of dementia with behavioral disturbance, atrial fibrillation, recurrent UTI and colon cancer who presents with increased confusion, refusal to eat, strong odor of urine and strong odor from her sacral decubitus ulcer. She has been bedbound for several weeks now.  She was found to be septic with fever, tachycardia, altered mentation. She was started on broad-spectrum antibiotics for suspected sacral decubitus ulcer infection.     Pt more alert upon visit. Spoke with caregiver at bedside. Caregiver reports pt's appetite is progressing slowly back to baseline. Meal completions charted as 50% for her last two meals. Pt is consuming magic cup, but not ensure's. No new weights obtained at this time. Palliative notes plan to continue with current therapy for aggressive wound care. Pt to be discharged to [facility] 8/31. Will continue to encourage PO intake and monitor supplement acceptance.   Medications reviewed and include: ferrous sulfate, folic acid, MVI, thiamine Labs reviewed: K 3.2 (L) CBG 100-127  Diet Order:  DIET DYS 2 Room service appropriate? Yes; Fluid consistency:  Thin  Skin:  Wound (see comment) (Pressure injuries: Stage IV sacrum, Stage I hip, DTI on rt heel)  Last BM:  8/27  Height:   Ht Readings from Last 1 Encounters:  01/20/17 5\' 9"  (1.753 m)    Weight:   Wt Readings from Last 1 Encounters:  01/20/17 123 lb 7.3 oz (56 kg)    Ideal Body Weight:  65.9 kg  BMI:  Body mass index is 18.23 kg/m.  Estimated Nutritional Needs:   Kcal:  1350-1550  Protein:  65-75g  Fluid:  1.5L/day  EDUCATION NEEDS:   No education needs identified at this time  [place], LDN Clinical Nutrition Pager # - [phone]

## 2017-01-25 NOTE — NC FL2 (Signed)
[place] MEDICAID FL2 LEVEL OF CARE SCREENING TOOL     IDENTIFICATION  Patient Name: Kathleen [name] Birthdate: 04-14-1926 Sex: female Admission Date (Current Location): 01/19/2017  [facility] and [place] Number:  Herbalist and Address:  [facility],  [address] [place], [place]      Provider Number: [id number]  Attending Physician Name and Address:  Theodis Blaze, MD  Relative Name and Phone Number:       Current Level of Care: Hospital Recommended Level of Care: [place] Prior Approval Number:    Date Approved/Denied:   PASRR Number:   [id number]  Discharge Plan: SNF    Current Diagnoses: Patient Active Problem List   Diagnosis Date Noted  . Decubitus ulcer of coccyx, stage IV ([place])   . Encounter for palliative care   . Goals of care, counseling/discussion   . Sacral decubitus ulcer 01/19/2017  . Sepsis ([place]) 01/19/2017  . Hypokalemia 01/19/2017  . Hypoalbuminemia 01/19/2017  . Acute encephalopathy 06/12/2013  . Hypothermia 06/12/2013  . Atrial fibrillation ([place]) 06/12/2013  . Dementia of Alzheimer's type with behavioral disturbance 06/12/2013  . Mild dehydration 06/12/2013  . Encephalopathy acute 06/12/2012  . Fever 06/12/2012  . UTI (lower urinary tract infection) 06/12/2012  . History of colon cancer 06/12/2012    Orientation RESPIRATION BLADDER Height & Weight     Self  Normal Incontinent Weight: 123 lb 7.3 oz (56 kg) Height:  5\' 9"  (175.3 cm)  BEHAVIORAL SYMPTOMS/MOOD NEUROLOGICAL BOWEL NUTRITION STATUS      Incontinent  (Dyshagia 2/Thin Liquids )  AMBULATORY STATUS COMMUNICATION OF NEEDS Skin   Extensive Assist Verbally PU Stage and Appropriate Care, Hydro Therapy PU Stage 1 Dressing:  (Intact Skin w/non blanchable redness of Hip PRN)     PU Stage 4 Dressing: BID (Sacrum/Full thickness tissure loss of exposed bone, tendon, muscle )  Hydrotherapy 6x/week               Personal Care Assistance  Level of Assistance  Bathing, Feeding, Dressing Bathing Assistance: Maximum assistance Feeding assistance: Independent Dressing Assistance: Maximum assistance     Functional Limitations Info  Sight, Hearing, Speech Sight Info: Adequate Hearing Info: Adequate Speech Info: Adequate    SPECIAL CARE FACTORS FREQUENCY  PT (By licensed PT)     PT Frequency: Min 2X/week              Contractures Contractures Info: Not present    Additional Factors Info  Allergies Code Status Info: Penicillins             Current Medications (01/25/2017):  This is the current hospital active medication list Current Facility-Administered Medications  Medication Dose Route Frequency Provider Last Rate Last Dose  . acetaminophen (TYLENOL) tablet 650 mg  650 mg Oral Q6H PRN Toy Baker, MD   650 mg at 01/21/17 0908   Or  . acetaminophen (TYLENOL) suppository 650 mg  650 mg Rectal Q6H PRN Toy Baker, MD      . aspirin EC tablet 81 mg  81 mg Oral Daily Theodis Blaze, MD   81 mg at 01/24/17 1009  . bisacodyl (DULCOLAX) suppository 10 mg  10 mg Rectal Daily PRN Toy Baker, MD      . Derrill Memo ON 01/26/2017] collagenase (SANTYL) ointment   Topical BID Meuth, Brooke A, PA-C      . donepezil (ARICEPT) tablet 10 mg  10 mg Oral QHS Toy Baker, MD   10 mg at 01/24/17  2137  . enoxaparin (LOVENOX) injection 40 mg  40 mg Subcutaneous Q24H Rayburn, Kelly A, PA-C   40 mg at 01/24/17 1009  . feeding supplement (ENSURE ENLIVE) (ENSURE ENLIVE) liquid 237 mL  237 mL Oral BID BM Janece Canterbury, MD   237 mL at 01/24/17 1000  . ferrous sulfate tablet 325 mg  325 mg Oral Q breakfast Janece Canterbury, MD   325 mg at 01/25/17 0756  . folic acid (FOLVITE) tablet 1 mg  1 mg Oral Daily Doutova, Anastassia, MD   1 mg at 01/24/17 1009  . MEDLINE mouth rinse  15 mL Mouth Rinse BID Janece Canterbury, MD   15 mL at 01/24/17 2136  . memantine (NAMENDA) tablet 10 mg  10 mg Oral BID Toy Baker, MD   10 mg at 01/24/17 2137  . morphine 2 MG/ML injection 1 mg  1 mg Intravenous Q3H PRN Loistine Chance, MD   1 mg at 01/24/17 2141  . multivitamin liquid 15 mL  15 mL Oral Daily Short, Mackenzie, MD   15 mL at 01/24/17 1010  . ondansetron (ZOFRAN) tablet 4 mg  4 mg Oral Q6H PRN Toy Baker, MD       Or  . ondansetron (ZOFRAN) injection 4 mg  4 mg Intravenous Q6H PRN Doutova, Anastassia, MD      . oxyCODONE (Oxy IR/ROXICODONE) immediate release tablet 5 mg  5 mg Oral Q4H PRN Loistine Chance, MD   5 mg at 01/23/17 1033  . polyethylene glycol (MIRALAX / GLYCOLAX) packet 17 g  17 g Oral Daily PRN Doutova, Anastassia, MD      . QUEtiapine (SEROQUEL) tablet 37.5 mg  37.5 mg Oral QHS Doutova, Anastassia, MD   37.5 mg at 01/24/17 2136  . sodium hypochlorite (DAKIN'S 1/4 STRENGTH) topical solution   Irrigation BID Meuth, Brooke A, PA-C      . sulfamethoxazole-trimethoprim (BACTRIM DS,SEPTRA DS) 800-160 MG per tablet 1 tablet  1 tablet Oral Q12H Theodis Blaze, MD   1 tablet at 01/24/17 2137  . thiamine (VITAMIN B-1) tablet 100 mg  100 mg Oral Daily Doutova, Anastassia, MD   100 mg at 01/24/17 1009     Discharge Medications: Please see discharge summary for a list of discharge medications.  Relevant Imaging Results:  Relevant Lab Results:   Additional Information ssn: [phone]  Lia Hopping, LCSW

## 2017-01-26 LAB — CBC
HEMATOCRIT: 32.7 % — AB (ref 36.0–46.0)
Hemoglobin: 10.6 g/dL — ABNORMAL LOW (ref 12.0–15.0)
MCH: 28.5 pg (ref 26.0–34.0)
MCHC: 32.4 g/dL (ref 30.0–36.0)
MCV: 87.9 fL (ref 78.0–100.0)
Platelets: 416 10*3/uL — ABNORMAL HIGH (ref 150–400)
RBC: 3.72 MIL/uL — ABNORMAL LOW (ref 3.87–5.11)
RDW: 16.7 % — AB (ref 11.5–15.5)
WBC: 12.7 10*3/uL — AB (ref 4.0–10.5)

## 2017-01-26 LAB — BASIC METABOLIC PANEL
ANION GAP: 3 — AB (ref 5–15)
BUN: 7 mg/dL (ref 6–20)
CO2: 29 mmol/L (ref 22–32)
Calcium: 9.3 mg/dL (ref 8.9–10.3)
Chloride: 106 mmol/L (ref 101–111)
Creatinine, Ser: 0.76 mg/dL (ref 0.44–1.00)
Glucose, Bld: 96 mg/dL (ref 65–99)
POTASSIUM: 3.9 mmol/L (ref 3.5–5.1)
SODIUM: 138 mmol/L (ref 135–145)

## 2017-01-26 NOTE — Progress Notes (Signed)
PROGRESS NOTE  Kathleen [name]  FBP:[id number] DOB: [birth year] DOA: 01/19/2017   PCP: Jani Gravel, MD  Brief Narrative:  83 year old female with history of dementia with behavioral disturbance, atrial fibrillation, recurrent UTI and colon cancer who presents with increased confusion, refusal to eat, strong odor of urine and strong odor from her sacral decubitus ulcer. She has been bedbound for several weeks now.  She was found to be septic with fever, tachycardia, altered mentation. She was started on broad-spectrum antibiotics for suspected sacral decubitus ulcer infection.  She appears clinically improved.  General surgery performed bedside debridement on 8/28.    Assessment & Plan:  Sepsis secondary to infection of necrotic sacral decubitus ulcer, stage IV.  Her chest x-ray and urinalysis were unremarkable. She has foul odor with drainage of her sacral ulcer.  X-ray did not demonstrate evidence of acute osteomyelitis sacrum. - Leukocytosis and overall mental status improving - IV fluids have been discontinued - Patient is on vancomycin and cefepime - Wound care team and Gen. surgery teams consulted  - Palliative care team also consulted and appreciate discussions and goals of care - needs to continue hydrotherapy, changed ABX to bactrim 8/30, cont same regimen  - WBC is slightly up this AM - repeat CBC in AM  Pain, likely on sacrum - cont analgesia as needed   Metabolic encephalopathy - Aspiration precautions, speech therapy evaluation done, dysphagia 2, thin recommended - alert this time, per daughter pt is at baseline   Dementia with behavioral disturbance - Continue namenda, seroquel, and aricept - mental status stable   Paroxysmal atrial fibrillation, no longer on anticoagulation due to falls risk - aspirin given  History of colon cancer - Overall stable  Severe protein calorie malnutrition - Nutrition consultation appreciated - cont supplementation   Normocytic  anemia - Pattern suggestive of chronic disease as underlying source with iron deficiency anemia - Has been transfused unit of blood 01/22/2017, hemoglobin stable ~10g - Iron studies with low iron and sat saturation, B12 300, folate 11 - TSH wnl - Occult stool negative - no evidence of active bleeding - continue iron supplementation   Hypokalemia - WNL this AM   DVT prophylaxis:  Lovenox Code Status:  DNR/DO NOT INTUBATE Family Communication:  Spoke with daughter at bedside  Disposition Plan:  SNF likely next week when bed available   Consultants:   Palliative care  Wound care  General surgery  Procedures:   None  Antimicrobials:  Anti-infectives    Start     Dose/Rate Route Frequency Ordered Stop   01/24/17 1400  sulfamethoxazole-trimethoprim (BACTRIM DS,SEPTRA DS) 800-160 MG per tablet 1 tablet     1 tablet Oral Every 12 hours 01/24/17 1232     01/24/17 0000  vancomycin (VANCOCIN) IVPB 750 mg/150 ml premix  Status:  Discontinued     750 mg 150 mL/hr over 60 Minutes Intravenous Every 12 hours 01/23/17 1453 01/24/17 1232   01/20/17 2200  vancomycin (VANCOCIN) 500 mg in sodium chloride 0.9 % 100 mL IVPB  Status:  Discontinued     500 mg 100 mL/hr over 60 Minutes Intravenous Every 12 hours 01/20/17 0638 01/23/17 1454   01/20/17 1400  metroNIDAZOLE (FLAGYL) IVPB 500 mg  Status:  Discontinued     500 mg 100 mL/hr over 60 Minutes Intravenous Every 8 hours 01/20/17 0510 01/20/17 1244   01/20/17 1400  aztreonam (AZACTAM) 2 g in dextrose 5 % 50 mL IVPB  Status:  Discontinued     2  g 100 mL/hr over 30 Minutes Intravenous Every 8 hours 01/20/17 0542 01/20/17 0638   01/20/17 1400  aztreonam (AZACTAM) 2 GM IVPB  Status:  Discontinued     2 g 100 mL/hr over 30 Minutes Intravenous Every 8 hours 01/20/17 0638 01/20/17 1244   01/20/17 1400  ceFEPIme (MAXIPIME) 1 g in dextrose 5 % 50 mL IVPB  Status:  Discontinued     1 g 100 mL/hr over 30 Minutes Intravenous Every 12 hours 01/20/17  1257 01/24/17 1232   01/20/17 0600  vancomycin (VANCOCIN) IVPB 1000 mg/200 mL premix     1,000 mg 200 mL/hr over 60 Minutes Intravenous  Once 01/20/17 0335 01/20/17 1012   01/20/17 0345  aztreonam (AZACTAM) 2 g in dextrose 5 % 50 mL IVPB     2 g 100 mL/hr over 30 Minutes Intravenous  Once 01/20/17 0335 01/20/17 0508   01/20/17 0345  metroNIDAZOLE (FLAGYL) IVPB 500 mg     500 mg 100 mL/hr over 60 Minutes Intravenous  Once 01/20/17 0335 01/20/17 0903      Subjective: Pt denies chest pain or dyspnea.   Objective: Vitals:   01/25/17 0646 01/25/17 1323 01/25/17 1955 01/26/17 0347  BP: (!) 151/75 128/60 92/67 (!) 143/72  Pulse: 75 78 79 75  Resp: 20 18 20 18   Temp: 97.7 F (36.5 C) (!) 97.5 F (36.4 C) 98.6 F (37 C) 98.6 F (37 C)  TempSrc: Oral Axillary Axillary Axillary  SpO2: 93% 92% 94% 95%  Weight:      Height:        Intake/Output Summary (Last 24 hours) at 01/26/17 1034 Last data filed at 01/26/17 0343  Gross per 24 hour  Intake               30 ml  Output             1051 ml  Net            -1021 ml   Filed Weights   01/20/17 0331  Weight: 56 kg (123 lb 7.3 oz)   Physical Exam  Constitutional: Appears calm, NAD, mittens on hands  CVS: RRR, S1/S2 +, no gallops, no carotid bruit.  Pulmonary: Effort and breath sounds normal, diminished breath sounds at bases  Abdominal: Soft. BS +,  no distension, tenderness, rebound or guarding.  Neuro: Alert. No cranial nerve deficit.  Data Reviewed: I have personally reviewed following labs and imaging studies  CBC:  Recent Labs Lab 01/19/17 2011  01/22/17 0354 01/23/17 0356 01/24/17 0419 01/25/17 0416 01/26/17 0522  WBC 14.6*  < > 16.3* 16.2* 14.2* 12.4* 12.7*  NEUTROABS 12.8*  --   --   --   --   --   --   HGB 9.2*  < > 7.1* 10.2* 10.2* 10.8* 10.6*  HCT 29.2*  < > 22.3* 30.1* 31.0* 31.8* 32.7*  MCV 89.6  < > 87.1 86.0 86.8 85.9 87.9  PLT 233  < > 214 241 285 338 416*  < > = values in this interval not  displayed. Basic Metabolic Panel:  Recent Labs Lab 01/20/17 0814  01/22/17 0354 01/23/17 0554 01/24/17 0419 01/25/17 0416 01/26/17 0522  NA 141  < > 138 138 136 136 138  K 3.6  < > 3.2* 3.6 3.2* 3.8 3.9  CL 109  < > 107 106 104 105 106  CO2 24  < > 25 24 24 26 29   GLUCOSE 106*  < > 113* 104* 100* 92  96  BUN 11  < > 9 8 9 9 7   CREATININE 0.66  < > 0.64 0.72 0.60 0.58 0.76  CALCIUM 8.9  < > 8.9 9.4 9.2 9.1 9.3  MG 1.4*  --   --   --   --   --   --   PHOS 2.9  --   --   --   --   --   --   < > = values in this interval not displayed. Liver Function Tests:  Recent Labs Lab 01/19/17 2011 01/20/17 0814  AST 24 22  ALT 14 13*  ALKPHOS 74 76  BILITOT 1.0 1.0  PROT 6.9 6.9  ALBUMIN 1.9* 1.8*   Urine analysis:    Component Value Date/Time   COLORURINE YELLOW 01/19/2017 2000   APPEARANCEUR CLEAR 01/19/2017 2000   LABSPEC 1.020 01/19/2017 2000   PHURINE 6.0 01/19/2017 2000   GLUCOSEU NEGATIVE 01/19/2017 2000   HGBUR NEGATIVE 01/19/2017 2000   [place] NEGATIVE 01/19/2017 2000   [place] NEGATIVE 01/19/2017 2000   PROTEINUR 30 (A) 01/19/2017 2000   UROBILINOGEN 0.2 06/12/2013 0632   NITRITE NEGATIVE 01/19/2017 2000   LEUKOCYTESUR NEGATIVE 01/19/2017 2000    Recent Results (from the past 240 hour(s))  Urine culture     Status: Abnormal   Collection Time: 01/19/17  8:00 PM  Result Value Ref Range Status   Specimen Description URINE, CATHETERIZED  Final   Special Requests NONE  Final   Culture MULTIPLE SPECIES PRESENT, SUGGEST RECOLLECTION (A)  Final   Report Status 01/21/2017 FINAL  Final  Blood culture (routine x 2)     Status: None   Collection Time: 01/19/17  8:50 PM  Result Value Ref Range Status   Specimen Description BLOOD BLOOD LEFT FOREARM  Final   Special Requests IN PEDIATRIC BOTTLE Blood Culture adequate volume  Final   Culture   Final    NO GROWTH 5 DAYS Performed at [facility], [place] [address] [place], [place] [address]    Report Status  01/25/2017 FINAL  Final  Blood culture (routine x 2)     Status: None   Collection Time: 01/19/17  9:00 PM  Result Value Ref Range Status   Specimen Description BLOOD RIGHT ARM UPPER  Final   Special Requests IN PEDIATRIC BOTTLE Blood Culture adequate volume  Final   Culture   Final    NO GROWTH 5 DAYS Performed at [facility], [place] [address], [place],  [address]    Report Status 01/25/2017 FINAL  Final  MRSA PCR Screening     Status: None   Collection Time: 01/20/17  2:57 PM  Result Value Ref Range Status   MRSA by PCR NEGATIVE NEGATIVE Final    Comment:        The GeneXpert MRSA Assay (FDA approved for NASAL specimens only), is one component of a comprehensive MRSA colonization surveillance program. It is not intended to diagnose MRSA infection nor to guide or monitor treatment for MRSA infections.     Radiology Studies: No results found.  Scheduled Meds: . aspirin EC  81 mg Oral Daily  . collagenase   Topical BID  . donepezil  10 mg Oral QHS  . enoxaparin (LOVENOX) injection  40 mg Subcutaneous Q24H  . feeding supplement (ENSURE ENLIVE)  237 mL Oral BID BM  . ferrous sulfate  325 mg Oral Q breakfast  . folic acid  1 mg Oral Daily  . mouth rinse  15 mL Mouth Rinse BID  . memantine  10 mg Oral BID  . multivitamin  15 mL Oral Daily  . QUEtiapine  37.5 mg Oral QHS  . sulfamethoxazole-trimethoprim  1 tablet Oral Q12H  . thiamine  100 mg Oral Daily   Continuous Infusions:   LOS: 6 days   Time spent: 25 minutes   Faye Ramsay, MD [facility] Pager [phone]  If 7PM-7AM, please contact night-coverage [email or web address] Password [email or web address] 01/26/2017, 10:34 AM

## 2017-01-26 NOTE — Progress Notes (Signed)
PT HYDROTHERAPY TREATMENT NOTE  01/26/17 1200  Subjective Assessment  Subjective I'm fine  Patient and Family Stated Goals pt unable to state  Date of Onset (present on adm)  Evaluation and Treatment     Pt wound remains with foul odor and is being constantly contaminated by stool; given pt immobility and multiple comorbidities she has limited potential for wound healing; Palliative Care notes state that family desires agreessive wound care-->pulsed lavage and dressing changes appear painful for pt despite pt being premedicated; no family present at time of session; If family wishes should change and  pt comfort becomes the goal of care we certainly would not hesitate to sign off as this wound is not likely to heal  Evaluation and Treatment Procedures Explained to Patient/Family Yes     Evaluation and Treatment Procedures Patient unable to consent due to mental status  Pressure Injury 01/23/17 Stage IV - Full thickness tissue loss with exposed bone, tendon or muscle. *Physical Therapy Hydrotherapy*  Date First Assessed/Time First Assessed: 01/23/17 1130   Location: Sacrum  Staging: Stage IV - Full thickness tissue loss with exposed bone, tendon or muscle.  Wound Description (Comments): *Physical Therapy Hydrotherapy*  Present on Admission: Yes  Dressing Type Moist to dry (santyl, NS 4x4s, abd, hypafix tape)  Dressing Change Frequency Twice a day  State of Healing Non-healing  Site / Wound Assessment Black;Brown;Pink;Painful (bone)  % Wound base Red or Granulating 15%  % Wound base Black/Eschar 75%  % Wound base Other/Granulation Tissue (Comment) (bone and fibrinous tissue)  Peri-wound Assessment Maceration  Wound Length (cm) (see eval)  Drainage Description Odor (foul odor, difficult to distinguish d/t, incont stool)  Treatment Hydrotherapy (Pulse lavage);Packing (Plain strip);Other (Comment);Cleansed (santyl)  Hydrotherapy  Pulsed lavage therapy - wound location sacrum  Pulsed  Lavage with Suction (psi) 8 psi  Pulsed Lavage with Suction - Normal Saline Used 1000 mL  Pulsed Lavage Tip Tip with splash shield  Selective Debridement  Selective Debridement - Location sacrum  Wound Therapy - Assess/Plan/Recommendations  Wound Therapy - Clinical Statement 83 yo female admitted from home with necrotic sacral wound. S/P bedside debridement by surgery 01/22/17 and 01/25/17. Hx of dementia.   Wound Therapy - Functional Problem List Dementia with behavioral issues  Factors Delaying/Impairing Wound Healing Immobility;Infection - systemic/local;Incontinence  Hydrotherapy Plan Debridement;Dressing change;Patient/family education;Pulsatile lavage with suction  Wound Therapy - Frequency 6X / week  Wound Therapy - Current Recommendations Case manager/social work;Surgery consult;WOC nurse  Wound Therapy - Follow Up Recommendations Skilled nursing facility;Home health RN;Wound [place] and debridement to decrease bioburden and facilitate wound healing. Orders are for Dakins solution use until 9/1. Santyl started today -9/1  Wound Therapy Goals - Improve the function of patient's integumentary system by progressing the wound(s) through the phases of wound healing by:  Decrease Necrotic Tissue to 50  Decrease Necrotic Tissue - Progress Progressing toward goal  Increase Granulation Tissue to 50  Increase Granulation Tissue - Progress Progressing toward goal  Goals/treatment plan/discharge plan were made with and agreed upon by patient/family No, Patient unable to participate in goals/treatment/discharge plan and family unavailable  Time For Goal Achievement 2 weeks  Wound Therapy - Potential for Goals Poor

## 2017-01-27 LAB — CBC
HCT: 34.6 % — ABNORMAL LOW (ref 36.0–46.0)
HEMOGLOBIN: 11.3 g/dL — AB (ref 12.0–15.0)
MCH: 28.5 pg (ref 26.0–34.0)
MCHC: 32.7 g/dL (ref 30.0–36.0)
MCV: 87.4 fL (ref 78.0–100.0)
Platelets: 481 10*3/uL — ABNORMAL HIGH (ref 150–400)
RBC: 3.96 MIL/uL (ref 3.87–5.11)
RDW: 16.6 % — ABNORMAL HIGH (ref 11.5–15.5)
WBC: 13.3 10*3/uL — AB (ref 4.0–10.5)

## 2017-01-27 LAB — BASIC METABOLIC PANEL
ANION GAP: 8 (ref 5–15)
BUN: 8 mg/dL (ref 6–20)
CHLORIDE: 104 mmol/L (ref 101–111)
CO2: 26 mmol/L (ref 22–32)
Calcium: 9.7 mg/dL (ref 8.9–10.3)
Creatinine, Ser: 0.63 mg/dL (ref 0.44–1.00)
GFR calc non Af Amer: >60 mL/min (ref >60)
Glucose, Bld: 89 mg/dL (ref 65–99)
POTASSIUM: 3.7 mmol/L (ref 3.5–5.1)
SODIUM: 138 mmol/L (ref 135–145)

## 2017-01-27 NOTE — Progress Notes (Signed)
PROGRESS NOTE  Kathleen [name]  IPJ:[id number] DOB: [birth year] DOA: 01/19/2017   PCP: Jani Gravel, MD  Brief Narrative:  83 year old female with history of dementia with behavioral disturbance, atrial fibrillation, recurrent UTI and colon cancer who presents with increased confusion, refusal to eat, strong odor of urine and strong odor from her sacral decubitus ulcer. She has been bedbound for several weeks now.  She was found to be septic with fever, tachycardia, altered mentation. She was started on broad-spectrum antibiotics for suspected sacral decubitus ulcer infection.  She appears clinically improved.  General surgery performed bedside debridement on 8/28.    Assessment & Plan:  Sepsis secondary to infection of necrotic sacral decubitus ulcer, stage IV.  Her chest x-ray and urinalysis were unremarkable. She has foul odor with drainage of her sacral ulcer.  X-ray did not demonstrate evidence of acute osteomyelitis sacrum. - Leukocytosis and overall mental status improving - IV fluids have been discontinued - Patient was on vancomycin and cefepime - Wound care team and Gen. surgery teams consulted  - Palliative care team also consulted and appreciate discussions and goals of care - needs to continue hydrotherapy, changed ABX to bactrim 8/30, wound however, remains with foul odor and constantly contaminated with stool - per WOC, given pt's immobility and multiple co morbidities, potential for wound healing is limited if any at all, transition to comfort is reasonable and recommended, will discuss with family   Pain, likely on sacrum - cont analgesia as needed   Metabolic encephalopathy - Aspiration precautions, speech therapy evaluation done, dysphagia 2, thin recommended - alert and appears to be at baseline per daughters  Dementia with behavioral disturbance - Continue namenda, seroquel, and aricept - at baseline, per daughters   Paroxysmal atrial fibrillation, no longer on  anticoagulation due to falls risk - cont Aspirin   History of colon cancer - Overall stable  Severe protein calorie malnutrition - Nutrition consultation appreciated - cont supplementation   Normocytic anemia - Pattern suggestive of chronic disease as underlying source with iron deficiency anemia - Has been transfused unit of blood 01/22/2017, hemoglobin stable ~10g - Iron studies with low iron and sat saturation, B12 300, folate 11 - TSH wnl - Occult stool negative - Hg overall stable with no evidence of active bleeding - CBC in AM  Hypokalemia - WNL   DVT prophylaxis:  Lovenox Code Status:  DNR/DO NOT INTUBATE Family Communication:  Both daughter updated on their individual cell phones  Disposition Plan:  SNF likely next week   Consultants:   Palliative care  Wound care  General surgery  Procedures:   None  Antimicrobials:  Anti-infectives    Start     Dose/Rate Route Frequency Ordered Stop   01/24/17 1400  sulfamethoxazole-trimethoprim (BACTRIM DS,SEPTRA DS) 800-160 MG per tablet 1 tablet     1 tablet Oral Every 12 hours 01/24/17 1232     01/24/17 0000  vancomycin (VANCOCIN) IVPB 750 mg/150 ml premix  Status:  Discontinued     750 mg 150 mL/hr over 60 Minutes Intravenous Every 12 hours 01/23/17 1453 01/24/17 1232   01/20/17 2200  vancomycin (VANCOCIN) 500 mg in sodium chloride 0.9 % 100 mL IVPB  Status:  Discontinued     500 mg 100 mL/hr over 60 Minutes Intravenous Every 12 hours 01/20/17 0638 01/23/17 1454   01/20/17 1400  metroNIDAZOLE (FLAGYL) IVPB 500 mg  Status:  Discontinued     500 mg 100 mL/hr over 60 Minutes Intravenous Every 8 hours  01/20/17 0510 01/20/17 1244   01/20/17 1400  aztreonam (AZACTAM) 2 g in dextrose 5 % 50 mL IVPB  Status:  Discontinued     2 g 100 mL/hr over 30 Minutes Intravenous Every 8 hours 01/20/17 0542 01/20/17 0638   01/20/17 1400  aztreonam (AZACTAM) 2 GM IVPB  Status:  Discontinued     2 g 100 mL/hr over 30 Minutes  Intravenous Every 8 hours 01/20/17 0638 01/20/17 1244   01/20/17 1400  ceFEPIme (MAXIPIME) 1 g in dextrose 5 % 50 mL IVPB  Status:  Discontinued     1 g 100 mL/hr over 30 Minutes Intravenous Every 12 hours 01/20/17 1257 01/24/17 1232   01/20/17 0600  vancomycin (VANCOCIN) IVPB 1000 mg/200 mL premix     1,000 mg 200 mL/hr over 60 Minutes Intravenous  Once 01/20/17 0335 01/20/17 1012   01/20/17 0345  aztreonam (AZACTAM) 2 g in dextrose 5 % 50 mL IVPB     2 g 100 mL/hr over 30 Minutes Intravenous  Once 01/20/17 0335 01/20/17 0508   01/20/17 0345  metroNIDAZOLE (FLAGYL) IVPB 500 mg     500 mg 100 mL/hr over 60 Minutes Intravenous  Once 01/20/17 0335 01/20/17 0903      Subjective: Pt denies any dyspnea or chest pain this AM.  Objective: Vitals:   01/26/17 0347 01/26/17 1356 01/26/17 2105 01/27/17 0508  BP: (!) 143/72 (!) 143/49 (!) 163/75 117/81  Pulse: 75 81 76 68  Resp: 18 16  18   Temp: 98.6 F (37 C) 98.2 F (36.8 C) 98.6 F (37 C) 97.7 F (36.5 C)  TempSrc: Axillary Axillary Oral Axillary  SpO2: 95% 94% 100% 98%  Weight:      Height:        Intake/Output Summary (Last 24 hours) at 01/27/17 1131 Last data filed at 01/27/17 0444  Gross per 24 hour  Intake              120 ml  Output             1100 ml  Net             -980 ml   Filed Weights   01/20/17 0331  Weight: 56 kg (123 lb 7.3 oz)   Physical Exam  Constitutional: Appears alert but confused, speaking but not answering appropriate to questions, mittens in place  CVS: RRR, S1/S2 +, no gallops, no carotid bruit.  Pulmonary: Effort and breath sounds normal, rhonchi at bases  Abdominal: Soft. BS +,  no distension, tenderness, rebound or guarding.   Data Reviewed: I have personally reviewed following labs and imaging studies  CBC:  Recent Labs Lab 01/23/17 0356 01/24/17 0419 01/25/17 0416 01/26/17 0522 01/27/17 0421  WBC 16.2* 14.2* 12.4* 12.7* 13.3*  HGB 10.2* 10.2* 10.8* 10.6* 11.3*  HCT 30.1* 31.0*  31.8* 32.7* 34.6*  MCV 86.0 86.8 85.9 87.9 87.4  PLT 241 285 338 416* 938*   Basic Metabolic Panel:  Recent Labs Lab 01/23/17 0554 01/24/17 0419 01/25/17 0416 01/26/17 0522 01/27/17 0421  NA 138 136 136 138 138  K 3.6 3.2* 3.8 3.9 3.7  CL 106 104 105 106 104  CO2 24 24 26 29 26   GLUCOSE 104* 100* 92 96 89  BUN 8 9 9 7 8   CREATININE 0.72 0.60 0.58 0.76 0.63  CALCIUM 9.4 9.2 9.1 9.3 9.7   Urine analysis:    Component Value Date/Time   COLORURINE YELLOW 01/19/2017 [place] 01/19/2017 2000  LABSPEC 1.020 01/19/2017 2000   PHURINE 6.0 01/19/2017 2000   GLUCOSEU NEGATIVE 01/19/2017 2000   HGBUR NEGATIVE 01/19/2017 2000   BILIRUBINUR NEGATIVE 01/19/2017 2000   KETONESUR NEGATIVE 01/19/2017 2000   PROTEINUR 30 (A) 01/19/2017 2000   UROBILINOGEN 0.2 06/12/2013 0632   NITRITE NEGATIVE 01/19/2017 2000   LEUKOCYTESUR NEGATIVE 01/19/2017 2000    Recent Results (from the past 240 hour(s))  Urine culture     Status: Abnormal   Collection Time: 01/19/17  8:00 PM  Result Value Ref Range Status   Specimen Description URINE, CATHETERIZED  Final   Special Requests NONE  Final   Culture MULTIPLE SPECIES PRESENT, SUGGEST RECOLLECTION (A)  Final   Report Status 01/21/2017 FINAL  Final  Blood culture (routine x 2)     Status: None   Collection Time: 01/19/17  8:50 PM  Result Value Ref Range Status   Specimen Description BLOOD BLOOD LEFT FOREARM  Final   Special Requests IN PEDIATRIC BOTTLE Blood Culture adequate volume  Final   Culture   Final    NO GROWTH 5 DAYS Performed at [facility], [place] [address] [place], [place] [address]    Report Status 01/25/2017 FINAL  Final  Blood culture (routine x 2)     Status: None   Collection Time: 01/19/17  9:00 PM  Result Value Ref Range Status   Specimen Description BLOOD RIGHT ARM UPPER  Final   Special Requests IN PEDIATRIC BOTTLE Blood Culture adequate volume  Final   Culture   Final    NO GROWTH 5  DAYS Performed at [facility], [place] [address], [place], [place] [address]    Report Status 01/25/2017 FINAL  Final  MRSA PCR Screening     Status: None   Collection Time: 01/20/17  2:57 PM  Result Value Ref Range Status   MRSA by PCR NEGATIVE NEGATIVE Final    Comment:        The GeneXpert MRSA Assay (FDA approved for NASAL specimens only), is one component of a comprehensive MRSA colonization surveillance program. It is not intended to diagnose MRSA infection nor to guide or monitor treatment for MRSA infections.     Radiology Studies: No results found.  Scheduled Meds: . aspirin EC  81 mg Oral Daily  . collagenase   Topical BID  . donepezil  10 mg Oral QHS  . enoxaparin (LOVENOX) injection  40 mg Subcutaneous Q24H  . feeding supplement (ENSURE ENLIVE)  237 mL Oral BID BM  . ferrous sulfate  325 mg Oral Q breakfast  . folic acid  1 mg Oral Daily  . mouth rinse  15 mL Mouth Rinse BID  . memantine  10 mg Oral BID  . multivitamin  15 mL Oral Daily  . QUEtiapine  37.5 mg Oral QHS  . sulfamethoxazole-trimethoprim  1 tablet Oral Q12H  . thiamine  100 mg Oral Daily   Continuous Infusions:   LOS: 7 days   Time spent: 25 minutes   Faye Ramsay, MD [facility] Pager [phone]  If 7PM-7AM, please contact night-coverage [email or web address] Password [email or web address] 01/27/2017, 11:31 AM

## 2017-01-27 NOTE — Progress Notes (Signed)
Daily Progress Note   Patient Name: Kathleen [name]       Date: 01/27/2017 DOB: Dec 07, 1925  Age: 83 y.o. MRN#: [id number] Attending Physician: Theodis Blaze, MD Primary Care Physician: Jani Gravel, MD Admit Date: 01/19/2017  Reason for Consultation/Follow-up: Establishing goals of care  Subjective: Ms. Strome is sleeping this evening.  I met with her daughter, Neoma Laming.  Length of Stay: 7  Current Medications: Scheduled Meds:  . aspirin EC  81 mg Oral Daily  . collagenase   Topical BID  . donepezil  10 mg Oral QHS  . enoxaparin (LOVENOX) injection  40 mg Subcutaneous Q24H  . feeding supplement (ENSURE ENLIVE)  237 mL Oral BID BM  . ferrous sulfate  325 mg Oral Q breakfast  . folic acid  1 mg Oral Daily  . mouth rinse  15 mL Mouth Rinse BID  . memantine  10 mg Oral BID  . multivitamin  15 mL Oral Daily  . QUEtiapine  37.5 mg Oral QHS  . sulfamethoxazole-trimethoprim  1 tablet Oral Q12H  . thiamine  100 mg Oral Daily    Continuous Infusions:   PRN Meds: acetaminophen **OR** acetaminophen, bisacodyl, morphine injection, ondansetron **OR** ondansetron (ZOFRAN) IV, oxyCODONE, polyethylene glycol  Physical Exam  Constitutional:  No distress  Eyes: EOM are normal.  Pulmonary/Chest: Effort normal. No respiratory distress.  Neurological: She is alert.  Confused and unable to answer questions          Vital Signs: BP 121/69 (BP Location: Right Arm)   Pulse 81   Temp 98.5 F (36.9 C) (Axillary)   Resp 16   Ht '5\' 9"'$  (1.753 m)   Wt 56 kg (123 lb 7.3 oz)   SpO2 100%   BMI 18.23 kg/m  SpO2: SpO2: 100 % O2 Device: O2 Device: Not Delivered O2 Flow Rate: O2 Flow Rate (L/min): 3 L/min  Intake/output summary:   Intake/Output Summary (Last 24 hours) at 01/27/17 1836 Last data filed  at 01/27/17 1723  Gross per 24 hour  Intake              120 ml  Output             1800 ml  Net            -1680 ml   LBM: Last  BM Date: 01/26/17 Baseline Weight: Weight: 56 kg (123 lb 7.3 oz) Most recent weight: Weight: 56 kg (123 lb 7.3 oz)       Palliative Assessment/Data: 20%    Flowsheet Rows     Most Recent Value  Intake Tab  Referral Department  Hospitalist  Unit at Time of Referral  Med/Surg Unit  Palliative Care Primary Diagnosis  Other (Comment) [dementia, sacral decub, decline. ]  Palliative Care Type  New Palliative care  Reason for referral  Clarify Goals of Care, Counsel Regarding Hospice  Date first seen by Palliative Care  01/20/17  Clinical Assessment  Palliative Performance Scale Score  20%  Pain Max last 24 hours  4  Pain Min Last 24 hours  3  Dyspnea Max Last 24 Hours  3  Dyspnea Min Last 24 hours  2  Nausea Max Last 24 Hours  3  Nausea Min Last 24 Hours  2  Anxiety Max Last 24 Hours  3  Anxiety Min Last 24 Hours  2  Psychosocial & Spiritual Assessment  Palliative Care Outcomes  Patient/Family meeting held?  Yes  Who was at the meeting?  daughter Lucill Mauck [facility] agent.   Palliative Care Outcomes  Clarified goals of care      Patient Active Problem List   Diagnosis Date Noted  . Decubitus ulcer of coccyx, stage IV ([place])   . Encounter for palliative care   . Goals of care, counseling/discussion   . Sacral decubitus ulcer 01/19/2017  . Sepsis ([place]) 01/19/2017  . Hypokalemia 01/19/2017  . Hypoalbuminemia 01/19/2017  . Acute encephalopathy 06/12/2013  . Hypothermia 06/12/2013  . Atrial fibrillation ([place]) 06/12/2013  . Dementia of Alzheimer's type with behavioral disturbance 06/12/2013  . Mild dehydration 06/12/2013  . Encephalopathy acute 06/12/2012  . Fever 06/12/2012  . UTI (lower urinary tract infection) 06/12/2012  . History of colon cancer 06/12/2012    Palliative Care Assessment & Plan   Patient Profile: Patient is a  83 year old lady with a past medical history significant for advanced Alzheimer's dementia who lives at home with her daughter. She essentially has [facility]. It is noted that for the past 2-3 weeks or so the patient has had diminishing oral intake has become more bedbound. She has underlying atrial fibrillation. She has been admitted with encephalopathy and stage IV decubitus ulcer  Recommendations/Plan:  Plan remains for aggressive wound management with plan for continued hydrotherapy at [facility].    Discussed with her daughter concerns that regardless of interventions, this is a wound that is unlikely to heal and it is a matter of time before she has further complications moving forward.  We also discussed that while plan remains to transition to SNF for hydrotherapy, she may want to reassess the benefit that her mother is receiving from continued hydrotherapy and care at [facility] after a week or two there.  If she is not improving, she can always elect to transition her mother home at that point in time.    Oxycodone '5mg'$  q 4 hours PRN for moderate pain and Morphine IV '1mg'$  q 3 hours PRN for severe pain while inpatient.  On discharge, would continue oxycodone '5mg'$  q4 hours prn and also ensure she has a dose 30 minutes prior to wound care.  Goals of Care and Additional Recommendations:  Limitations on Scope of Treatment: DNR  Code Status:    Code Status Orders        Start     Ordered  01/20/17 0336  Do not attempt resuscitation (DNR)  Continuous    Question Answer Comment  In the event of cardiac or respiratory ARREST Do not call a "code blue"   In the event of cardiac or respiratory ARREST Do not perform Intubation, CPR, defibrillation or ACLS   In the event of cardiac or respiratory ARREST Use medication by any route, position, wound care, and other measures to relive pain and suffering. May use oxygen, suction and manual treatment of airway obstruction as needed for comfort.       01/20/17 0335    Code Status History    Date Active Date Inactive Code Status Order ID Comments User Context   06/12/2013  9:01 AM 06/13/2013  2:36 PM Full Code [id number]  Barton Dubois, MD Inpatient   06/12/2012 11:35 AM 06/15/2012  7:16 PM Full Code [id number]  Jeannine Kitten, RN Inpatient    Advance Directive Documentation     Most Recent Value  Type of Advance Directive  Healthcare Power of Attorney  Pre-existing out of facility DNR order (yellow form or pink MOST form)  -  "MOST" Form in Place?  -       Prognosis:   Unable to determine Depends on PO intake and clinical course with infection and sacral wound. Overall poor prognosis and failure to thrive.   Discharge Planning:  To Be Determined.  Plan is aggressive wound care including hydrotherapy.  If desire to continue hydrotherapy, SW notes that she can do this at [facility].   Thank you for allowing the Palliative Medicine Team to assist in the care of this patient.   Total time: 30 minutes    Greater than 50%  of this time was spent counseling and coordinating care related to the above assessment and plan.  Micheline Rough, MD  Please contact Palliative Medicine Team phone at [phone] for questions and concerns.   Micheline Rough, MD [place] Team [phone]

## 2017-01-28 LAB — BASIC METABOLIC PANEL
ANION GAP: 7 (ref 5–15)
BUN: 8 mg/dL (ref 6–20)
CALCIUM: 9.6 mg/dL (ref 8.9–10.3)
CO2: 26 mmol/L (ref 22–32)
CREATININE: 0.77 mg/dL (ref 0.44–1.00)
Chloride: 104 mmol/L (ref 101–111)
Glucose, Bld: 93 mg/dL (ref 65–99)
Potassium: 4.2 mmol/L (ref 3.5–5.1)
SODIUM: 137 mmol/L (ref 135–145)

## 2017-01-28 LAB — CBC
HCT: 32.5 % — ABNORMAL LOW (ref 36.0–46.0)
HEMOGLOBIN: 10.4 g/dL — AB (ref 12.0–15.0)
MCH: 28.3 pg (ref 26.0–34.0)
MCHC: 32 g/dL (ref 30.0–36.0)
MCV: 88.6 fL (ref 78.0–100.0)
PLATELETS: 508 10*3/uL — AB (ref 150–400)
RBC: 3.67 MIL/uL — AB (ref 3.87–5.11)
RDW: 17.1 % — ABNORMAL HIGH (ref 11.5–15.5)
WBC: 12.4 10*3/uL — AB (ref 4.0–10.5)

## 2017-01-28 MED ORDER — ADULT MULTIVITAMIN LIQUID CH: 15.0000 mL | Freq: Every day | ORAL | Status: DC

## 2017-01-28 MED ORDER — FERROUS SULFATE 325 (65 FE) MG PO TABS: 325.0000 mg | ORAL_TABLET | Freq: Every day | ORAL | 3 refills | Status: DC

## 2017-01-28 MED ORDER — POLYETHYLENE GLYCOL 3350 17 G PO PACK: 17.0000 g | PACK | Freq: Every day | ORAL | 0 refills | Status: DC | PRN

## 2017-01-28 MED ORDER — ACETAMINOPHEN 325 MG PO TABS
650.0000 mg | ORAL_TABLET | Freq: Four times a day (QID) | ORAL | Status: DC | PRN
Start: 2017-01-28 — End: 2017-03-16

## 2017-01-28 MED ORDER — SULFAMETHOXAZOLE-TRIMETHOPRIM 800-160 MG PO TABS: 1.0000 | ORAL_TABLET | Freq: Two times a day (BID) | ORAL | 0 refills | Status: DC

## 2017-01-28 MED ORDER — BISACODYL 10 MG RE SUPP: 10.0000 mg | Freq: Every day | RECTAL | 0 refills | Status: DC | PRN

## 2017-01-28 MED ORDER — COLLAGENASE 250 UNIT/GM EX OINT: TOPICAL_OINTMENT | Freq: Two times a day (BID) | CUTANEOUS | 0 refills | Status: DC

## 2017-01-28 MED ORDER — ENSURE ENLIVE PO LIQD: 237.0000 mL | Freq: Two times a day (BID) | ORAL | 12 refills | Status: DC

## 2017-01-28 NOTE — Discharge Instructions (Signed)
Fever, Adult °A fever is an increase in the body’s temperature. It is usually defined as a temperature of 100°F (38°C) or higher. Brief mild or moderate fevers generally have no long-term effects, and they often do not require treatment. Moderate or high fevers may make you feel uncomfortable and can sometimes be a sign of a serious illness or disease. The sweating that may occur with repeated or prolonged fever may also cause dehydration. °Fever is confirmed by taking a temperature with a thermometer. A measured temperature can vary with: °· Age. °· Time of day. °· Location of the thermometer: °¨ Mouth (oral). °¨ Rectum (rectal). °¨ Ear (tympanic). °¨ Underarm (axillary). °¨ Forehead (temporal). °Follow these instructions at home: °Pay attention to any changes in your symptoms. Take these actions to help with your condition: °· Take over-the counter and prescription medicines only as told by your health care provider. Follow the dosing instructions carefully. °· If you were prescribed an antibiotic medicine, take it as told by your health care provider. Do not stop taking the antibiotic even if you start to feel better. °· Rest as needed. °· Drink enough fluid to keep your urine clear or pale yellow. This helps to prevent dehydration. °· Sponge yourself or bathe with room-temperature water to help reduce your body temperature as needed. Do not use ice water. °· Do not overbundle yourself in blankets or heavy clothes. °Contact a health care provider if: °· You vomit. °· You cannot eat or drink without vomiting. °· You have diarrhea. °· You have pain when you urinate. °· Your symptoms do not improve with treatment. °· You develop new symptoms. °· You develop excessive weakness. °Get help right away if: °· You have shortness of breath or have trouble breathing. °· You are dizzy or you faint. °· You are disoriented or confused. °· You develop signs of dehydration, such as a dry mouth, decreased urination, or  paleness. °· You develop severe pain in your abdomen. °· You have persistent vomiting or diarrhea. °· You develop a skin rash. °· Your symptoms suddenly get worse. °This information is not intended to replace advice given to you by your health care provider. Make sure you discuss any questions you have with your health care provider. °Document Released: 11/07/2000 Document Revised: 10/20/2015 Document Reviewed: 07/08/2014 °Elsevier Interactive Patient Education © 2017 Elsevier Inc. ° °

## 2017-01-28 NOTE — Progress Notes (Signed)
Pt discharge to [facility], daughter at bedside. Pt personal belonging given to daughter, pt only has left earring, right earring daughter states she has the earring. Daughter checking over room. Report called to [facility], pt stable, wound re-enforced. Pt awake and appears to be resting without noted discomfort. SRP,RN

## 2017-01-28 NOTE — Clinical Social Work Placement (Signed)
Patient received and accepted bed offer at [facility]. PT[facility] contacted, family notified. Patient's RN can call report to [phone], packet complete.CSW signing off, no other needs identified at this time.   CLINICAL SOCIAL WORK PLACEMENT  NOTE  Date:  01/28/2017  Patient Details  Name: Kathleen [name] MRN: [id number] Date of Birth: 05-02-1926  Clinical Social Work is seeking post-discharge placement for this patient at the [place] level of care (*CSW will initial, date and re-position this form in  chart as items are completed):  Yes   Patient/family provided with [facility] Work Department's list of facilities offering this level of care within the geographic area requested by the patient (or if unable, by the patient's family).  Yes   Patient/family informed of their freedom to choose among providers that offer the needed level of care, that participate in Medicare, Medicaid or managed care program needed by the patient, have an available bed and are willing to accept the patient.  Yes   Patient/family informed of [facility]'s ownership interest in [facility] and [facility], as well as of the fact that they are under no obligation to receive care at these facilities.  PASRR submitted to EDS on       PASRR number received on 01/25/17     Existing PASRR number confirmed on       FL2 transmitted to all facilities in geographic area requested by pt/family on 01/25/17     FL2 transmitted to all facilities within larger geographic area on       Patient informed that his/her managed care company has contracts with or will negotiate with certain facilities, including the following:        Yes   Patient/family informed of bed offers received.  Patient chooses bed at [facility]     Physician recommends and patient chooses bed at      Patient to be transferred to [facility] on 01/28/17.  Patient to be  transferred to facility by PTAR     Patient family notified on 01/28/17 of transfer.  Name of family member notified:  Bonner Puna     PHYSICIAN       Additional Comment:    _______________________________________________ Burnis Medin, LCSW 01/28/2017, 1:33 PM

## 2017-01-28 NOTE — Progress Notes (Signed)
Report given to Crystal at [facility], pt in stable condition. SRP, RN

## 2017-01-28 NOTE — Discharge Summary (Signed)
Physician Discharge Summary  Kathleen [name] WUJ:[id number] DOB: Dec 22, 1925 DOA: 01/19/2017  PCP: Jani Gravel, MD  Admit date: 01/19/2017 Discharge date: 01/28/2017  Recommendations for Outpatient Follow-up:  1. Pt will need to follow up with PCP in 1-2 weeks post discharge 2. Please obtain BMP to evaluate electrolytes and kidney function 3. Please also check CBC to evaluate Hg and Hct levels 4. Complete Bactrim for 7 more days  5. Please note that wound remains with foul odor and constantly contaminated with stool, per WOC, given pt's immobility and multiple co morbidities, potential for wound healing is limited if any at all, transition to comfort is reasonable and recommended, to be determined in about next 1-2 weeks after further GOC discussion with family as family wants to see how pt does for next 1-2 weeks   Discharge Diagnoses:  Active Problems:   History of colon cancer   Acute encephalopathy   Atrial fibrillation (HCC)   Dementia of Alzheimer's type with behavioral disturbance   Sacral decubitus ulcer   Sepsis ([place])   Hypokalemia   Hypoalbuminemia   Decubitus ulcer of coccyx, stage IV ([place])   Encounter for palliative care   Goals of care, counseling/discussion   Discharge Condition: Stable  Diet recommendation: as tolerated   History of present illness:  83 year old female with history of dementia with behavioral disturbance, atrial fibrillation, recurrent UTI and colon cancer who presents with increased confusion, refusal to eat, strong odor of urine and strong odor from her sacral decubitus ulcer. She has been bedbound for several weeks now.  She was found to be septic with fever, tachycardia, altered mentation. She was started on broad-spectrum antibiotics for suspected sacral decubitus ulcer infection. General surgery performed bedside debridement on 8/28.    Assessment & Plan:  Sepsis secondary to infection of necrotic sacral decubitus ulcer, stage IV.  Her chest  x-ray and urinalysis were unremarkable. She has foul odor with drainage of her sacral ulcer.  X-ray did not demonstrate evidence of acute osteomyelitis sacrum. - Leukocytosis and overall mental status improving  - IV fluids have been discontinued - Patient was on vancomycin and cefepime - Wound care team and Gen. surgery teams consulted  - Palliative care team also consulted and appreciate discussions and goals of care - needs to continue hydrotherapy, changed ABX to bactrim 8/30, wound however, remains with foul odor and constantly contaminated with stool - per WOC, given pt's immobility and multiple co morbidities, potential for wound healing is limited if any at all, transition to comfort is reasonable and recommended, will discuss with family   Pain, likely on sacrum - cont analgesia as needed   Metabolic encephalopathy - Aspiration precautions, speech therapy evaluation done, dysphagia 2, thin recommended - alert but confused   Dementia with behavioral disturbance - Continue namenda, seroquel, and aricept - at baseline, per daughters   Paroxysmal atrial fibrillation, no longer on anticoagulation due to falls risk - cont Aspirin   History of colon cancer - Overall stable  Severe protein calorie malnutrition - Nutrition consultation appreciated - cont supplementation   Normocytic anemia - Pattern suggestive of chronic disease as underlying source with iron deficiency anemia - Has been transfused unit of blood 01/22/2017, hemoglobin stable ~10g - Iron studies with low iron and sat saturation, B12 300, folate 11 - TSH wnl - Occult stool negative - Hg overall stable with no evidence of active bleeding  Hypokalemia - WNL   DVT prophylaxis:  Lovenox Code Status:  DNR/DO NOT INTUBATE  Family Communication:  no family at bedside this AM  Disposition Plan:  SNF   Consultants:   Palliative care  Wound care  General surgery  Procedures:    None   Procedures/Studies: Dg Chest 2 View  Result Date: 01/19/2017 CLINICAL DATA:  Increased confusion over the past several days. EXAM: CHEST  2 VIEW COMPARISON:  06/22/2015 FINDINGS: Stable cardiomegaly with aortic atherosclerosis. No acute pneumonic consolidation, effusion or pneumothorax. No overt pulmonary edema. Osteoarthritis of both AC and glenohumeral joints with high-riding humeral heads consistent with chronic rotator cuff tears. IMPRESSION: 1. No active cardiopulmonary disease. 2. Stable cardiomegaly with aortic atherosclerosis. 3. Osteoarthritis of both shoulders. Electronically Signed   By: Ashley Royalty M.D.   On: 01/19/2017 22:19   Dg Sacrum/coccyx  Result Date: 01/19/2017 CLINICAL DATA:  Decubitus ulcer EXAM: SACRUM AND COCCYX - 2+ VIEW COMPARISON:  CT from 06/12/2012. FINDINGS: Sclerosis about both SI joints consistent with osteoarthritis. Surgical clips project over the mid pelvis. Soft tissue ulceration over the the dorsum of the mid sacrum. No conclusive evidence for acute osteomyelitis. Bones however are slightly demineralized in appearance limiting assessment. There is lower lumbar degenerative disc disease from L4 through S1. IMPRESSION: 1. Lower lumbar spondylosis with degenerative disc disease. 2. Decubitus ulcer overlying the dorsum of the mid sacrum without definite radiographic evidence of acute osteomyelitis. Electronically Signed   By: Ashley Royalty M.D.   On: 01/19/2017 22:21     Discharge Exam: Vitals:   01/27/17 2133 01/28/17 0604  BP: (!) 150/61 (!) 169/72  Pulse: 89 64  Resp: 16 18  Temp: (!) 97.5 F (36.4 C) (!) 97.4 F (36.3 C)  SpO2: 99% 96%   Vitals:   01/27/17 0508 01/27/17 1336 01/27/17 2133 01/28/17 0604  BP: 117/81 121/69 (!) 150/61 (!) 169/72  Pulse: 68 81 89 64  Resp: 18 16 16 18   Temp: 97.7 F (36.5 C) 98.5 F (36.9 C) (!) 97.5 F (36.4 C) (!) 97.4 F (36.3 C)  TempSrc: Axillary Axillary Oral Oral  SpO2: 98% 100% 99% 96%  Weight:       Height:        General: Pt is alert, calm but confused  Cardiovascular: Regular rate and rhythm, S1/S2 +, no murmurs, no rubs, no gallops Respiratory: Clear to auscultation bilaterally, no wheezing Abdominal: Soft, non tender, non distended, bowel sounds +, no guardin   Discharge Instructions    Allergies as of 01/28/2017      Reactions   Penicillins Other (See Comments)   Pt family and pt does not know the reaction. Per records, she has tolerated Rocephin.      Medication List    STOP taking these medications   ALPRAZolam 1 MG tablet Commonly known as:  XANAX   diltiazem 120 MG 24 hr capsule Commonly known as:  CARDIZEM CD     TAKE these medications   acetaminophen 325 MG tablet Commonly known as:  TYLENOL Take 2 tablets (650 mg total) by mouth every 6 (six) hours as needed for mild pain (or Fever >/= 101).   aspirin EC 81 MG tablet Take 81 mg by mouth daily as needed for mild pain (hemorrhoids).   bisacodyl 10 MG suppository Commonly known as:  DULCOLAX Place 1 suppository (10 mg total) rectally daily as needed for moderate constipation.   collagenase ointment Commonly known as:  SANTYL Apply topically 2 (two) times daily.   donepezil 10 MG tablet Commonly known as:  ARICEPT Take 10 mg by mouth  at bedtime.   feeding supplement (ENSURE ENLIVE) Liqd Take 237 mLs by mouth 2 (two) times daily between meals.   ferrous sulfate 325 (65 FE) MG tablet Take 1 tablet (325 mg total) by mouth daily with breakfast.   memantine 10 MG tablet Commonly known as:  NAMENDA Take 10 mg by mouth 2 (two) times daily.   multivitamin Liqd Take 15 mLs by mouth daily.   oxyCODONE 5 MG immediate release tablet Commonly known as:  Oxy IR/ROXICODONE Take 1 tablet (5 mg total) by mouth every 4 (four) hours as needed for moderate pain.   polyethylene glycol packet Commonly known as:  MIRALAX / GLYCOLAX Take 17 g by mouth daily as needed for mild constipation.   QUEtiapine 25  MG tablet Commonly known as:  SEROQUEL Take 37.5 mg by mouth at bedtime.   sulfamethoxazole-trimethoprim 800-160 MG tablet Commonly known as:  BACTRIM DS,SEPTRA DS Take 1 tablet by mouth every 12 (twelve) hours.   VITAMIN B-12 CR PO Take 1 tablet by mouth daily.   Vitamin D (Ergocalciferol) 50000 units Caps capsule Commonly known as:  DRISDOL Take 50,000 Units by mouth every 7 (seven) days. On Wednesday            Discharge Care Instructions        Start     Ordered   01/29/17 0000  ferrous sulfate 325 (65 FE) MG tablet  Daily with breakfast     01/28/17 1114   01/29/17 0000  Multiple Vitamin (MULTIVITAMIN) LIQD  Daily     01/28/17 1114   01/28/17 0000  acetaminophen (TYLENOL) 325 MG tablet  Every 6 hours PRN     01/28/17 1114   01/28/17 0000  bisacodyl (DULCOLAX) 10 MG suppository  Daily PRN     01/28/17 1114   01/28/17 0000  collagenase (SANTYL) ointment  2 times daily     01/28/17 1114   01/28/17 0000  feeding supplement, ENSURE ENLIVE, (ENSURE ENLIVE) LIQD  2 times daily between meals     01/28/17 1114   01/28/17 0000  polyethylene glycol (MIRALAX / GLYCOLAX) packet  Daily PRN     01/28/17 1114   01/28/17 0000  sulfamethoxazole-trimethoprim (BACTRIM DS,SEPTRA DS) 800-160 MG tablet  Every 12 hours     01/28/17 1114   01/25/17 0000  oxyCODONE (OXY IR/ROXICODONE) 5 MG immediate release tablet  Every 4 hours PRN     01/25/17 6160      Follow-up Information    Jani Gravel, MD Follow up.   Specialty:  Internal Medicine Contact information: [place] [address] [phone]            The results of significant diagnostics from this hospitalization (including imaging, microbiology, ancillary and laboratory) are listed below for reference.     Microbiology: Recent Results (from the past 240 hour(s))  Urine culture     Status: Abnormal   Collection Time: 01/19/17  8:00 PM  Result Value Ref Range Status   Specimen Description  URINE, CATHETERIZED  Final   Special Requests NONE  Final   Culture MULTIPLE SPECIES PRESENT, SUGGEST RECOLLECTION (A)  Final   Report Status 01/21/2017 FINAL  Final  Blood culture (routine x 2)     Status: None   Collection Time: 01/19/17  8:50 PM  Result Value Ref Range Status   Specimen Description BLOOD BLOOD LEFT FOREARM  Final   Special Requests IN PEDIATRIC BOTTLE Blood Culture adequate volume  Final   Culture  Final    NO GROWTH 5 DAYS Performed at [facility], [place] [address], [place], [place] [address]    Report Status 01/25/2017 FINAL  Final  Blood culture (routine x 2)     Status: None   Collection Time: 01/19/17  9:00 PM  Result Value Ref Range Status   Specimen Description BLOOD RIGHT ARM UPPER  Final   Special Requests IN PEDIATRIC BOTTLE Blood Culture adequate volume  Final   Culture   Final    NO GROWTH 5 DAYS Performed at [facility], [place] [address], [place], [place] [address]    Report Status 01/25/2017 FINAL  Final  MRSA PCR Screening     Status: None   Collection Time: 01/20/17  2:57 PM  Result Value Ref Range Status   MRSA by PCR NEGATIVE NEGATIVE Final    Comment:        The GeneXpert MRSA Assay (FDA approved for NASAL specimens only), is one component of a comprehensive MRSA colonization surveillance program. It is not intended to diagnose MRSA infection nor to guide or monitor treatment for MRSA infections.      Labs: Basic Metabolic Panel:  Recent Labs Lab 01/24/17 0419 01/25/17 0416 01/26/17 0522 01/27/17 0421 01/28/17 0555  NA 136 136 138 138 137  K 3.2* 3.8 3.9 3.7 4.2  CL 104 105 106 104 104  CO2 24 26 29 26 26   GLUCOSE 100* 92 96 89 93  BUN 9 9 7 8 8   CREATININE 0.60 0.58 0.76 0.63 0.77  CALCIUM 9.2 9.1 9.3 9.7 9.6   CBC:  Recent Labs Lab 01/24/17 0419 01/25/17 0416 01/26/17 0522 01/27/17 0421 01/28/17 0555  WBC 14.2* 12.4* 12.7* 13.3* 12.4*  HGB 10.2* 10.8* 10.6* 11.3* 10.4*  HCT 31.0* 31.8* 32.7*  34.6* 32.5*  MCV 86.8 85.9 87.9 87.4 88.6  PLT 285 338 416* 481* 508*    SIGNED: Time coordinating discharge: 60 minutes  Faye Ramsay, MD  [facility] 01/28/2017, 11:14 AM Pager [phone]  If 7PM-7AM, please contact night-coverage [email or web address] Password [email or web address]

## 2017-01-28 NOTE — Clinical Social Work Note (Signed)
Clinical Social Work Assessment  Patient Details  Name: Kathleen [name] MRN: [id number] Date of Birth: 09-May-1926  Date of referral:  01/28/17               Reason for consult:  Facility Placement                Permission sought to share information with:  Chartered certified accountant granted to share information::  Yes, Verbal Permission Granted  Name::        Agency::     Relationship::     Contact Information:     Housing/Transportation Living arrangements for the past 2 months:  Single Family Home Source of Information:  Adult Children [place] Name) Patient Interpreter Needed:  None Criminal Activity/Legal Involvement Pertinent to Current Situation/Hospitalization:  No - Comment as needed Significant Relationships:  Adult Children, Other(Comment) (caretakers ) Lives with:  Self (24/7 care takers) Do you feel safe going back to the place where you live?   (PT recommended SNF) Need for family participation in patient care:  Yes (Comment)  Care giving concerns:  Patient is from home with 24/7 care givers. Patient's daughter reported that prior to hospitalization patient was walking around. PT is recommending SNF for hydrotherapy for stage 4 wound. Patient's daughter is agreeable to SNF.   Social Worker assessment / plan:  CSW spoke with patient's daughter in waiting area regarding PT recommendation for SNF for hydrotherapy, patient oriented to person and unable to participate in assessment. Patient's daughter reported that she is agreeable to SNF due to patient's need for hydrotherapy. Patient's daughter reported that she is interested in [facility] for [employer], CSW informed patient that there sister facility [facility] does hydrotherapy. Patient's daughter reported that she is agreeable to patient going to [facility] SNF. Patient's daughter reported that patient will need PTAR for transport.   CSW sent patient's referral to [facility] SNF and followed up with  staff about bed availability for patient. Staff confirmed that patient is accepted and can arrive when medically stable for discharge. CSW updated patient's attending MD that reported that patient is ready for discharge. Patient's daughter updated and reported that she would go to facility to complete paperwork. CSW will continue to follow and assist with discharge planning.   Employment status:  Retired Nurse, adult PT Recommendations:  [place] / Referral to community resources:  [place]  Patient/Family's Response to care:  Patient's daughter agreeable to SNF for hydrotherapy. Patient's daughter thanked CSW for assistance with discharge planning to SNF.  Patient/Family's Understanding of and Emotional Response to Diagnosis, Current Treatment, and Prognosis:  Patient's daughter verbalized understanding of patient's diagnosis, current treatment and prognosis. Patient's daughter hopeful that patient's wound will start to heal but acknowledged that the wound may not heal. Patient's daughter and CSW discussed how she has been coping with patient's illness. Patient's daughter reported that it has been tiring and that she has friend and her sister that provide support. Patient's daughter expressed that she just wants the patient to receive good care, CSW validate patient and provided emotional support.   Emotional Assessment Appearance:    Attitude/Demeanor/Rapport:  Unable to Assess Affect (typically observed):  Unable to Assess Orientation:  Oriented to Self Alcohol / Substance use:  Not Applicable Psych involvement (Current and /or in the community):  No (Comment)  Discharge Needs  Concerns to be addressed:  No discharge needs identified Readmission within the last 30 days:  No Current  discharge risk:  None Barriers to Discharge:  No Barriers Identified   Burnis Medin, LCSW 01/28/2017, 1:08 PM

## 2017-01-28 NOTE — Progress Notes (Signed)
   01/28/17 1300  Subjective Assessment  Patient and Family Stated Goals pt unable to state   Pt continues to c/o pain during wound care--pt was premedicated prior to session  Date of Onset (present on admission)  Evaluation and Treatment  Evaluation and Treatment Procedures Explained to Patient/Family Yes  Evaluation and Treatment Procedures Patient unable to consent due to mental status  Pressure Injury 01/23/17 Stage IV - Full thickness tissue loss with exposed bone, tendon or muscle. *Physical Therapy Hydrotherapy*  Date First Assessed/Time First Assessed: 01/23/17 1130   Location: Sacrum  Staging: Stage IV - Full thickness tissue loss with exposed bone, tendon or muscle.  Wound Description (Comments): *Physical Therapy Hydrotherapy*  Present on Admission: Yes  Dressing Type Moist to dry (Santyl)  Dressing Change Frequency Twice a day  State of Healing Non-healing  Site / Wound Assessment Black;Brown;Pink;Painful  % Wound base Red or Granulating 15%  % Wound base Yellow/Fibrinous Exudate 45%  % Wound base Black/Eschar 40%  Wound Length (cm) (see eval)  Treatment Hydrotherapy (Pulse lavage);Packing (Saline gauze);Debridement (Selective)  Hydrotherapy  Pulsed lavage therapy - wound location sacrum  Pulsed Lavage with Suction (psi) 8 psi  Pulsed Lavage with Suction - Normal Saline Used 1000 mL  Pulsed Lavage Tip Tip with splash shield  Selective Debridement  Selective Debridement - Location sacrum  Selective Debridement - Tools Used Forceps;Scissors  Selective Debridement - Tissue Removed black and brown necrotic tissue  Wound Therapy - Assess/Plan/Recommendations  Wound Therapy - Clinical Statement 83 yo female admitted from home with necrotic sacral wound. S/P bedside debridement by surgery 01/22/17 and 01/25/17. Hx of dementia.   Wound Therapy - Functional Problem List Dementia with behavioral issues  Factors Delaying/Impairing Wound Healing Immobility;Infection -  systemic/local;Incontinence  Hydrotherapy Plan Debridement;Dressing change;Patient/family education;Pulsatile lavage with suction  Wound Therapy - Frequency 6X / week  Wound Therapy - Current Recommendations Case manager/social work;Surgery consult;WOC nurse  Wound Therapy - Follow Up Recommendations Skilled nursing facility;Home health RN;Wound Morton and debridement to decrease bioburden and facilitate wound healing. Orders are for Dakins solution use until 9/1. Santyl started today -9/1  Wound Therapy Goals - Improve the function of patient's integumentary system by progressing the wound(s) through the phases of wound healing by:  Decrease Necrotic Tissue to 50  Decrease Necrotic Tissue - Progress Progressing toward goal  Increase Granulation Tissue to 50  Increase Granulation Tissue - Progress Progressing toward goal  Time For Goal Achievement 2 weeks  Wound Therapy - Potential for Goals Poor

## 2017-01-29 ENCOUNTER — Encounter: Payer: Self-pay | Admitting: Adult Health

## 2017-01-29 ENCOUNTER — Non-Acute Institutional Stay (SKILLED_NURSING_FACILITY): Payer: Medicare Other | Admitting: Adult Health

## 2017-01-29 DIAGNOSIS — I482 Chronic atrial fibrillation, unspecified: Secondary | ICD-10-CM

## 2017-01-29 DIAGNOSIS — A419 Sepsis, unspecified organism: Secondary | ICD-10-CM | POA: Diagnosis not present

## 2017-01-29 DIAGNOSIS — F0281 Dementia in other diseases classified elsewhere with behavioral disturbance: Secondary | ICD-10-CM

## 2017-01-29 DIAGNOSIS — D638 Anemia in other chronic diseases classified elsewhere: Secondary | ICD-10-CM | POA: Insufficient documentation

## 2017-01-29 DIAGNOSIS — E43 Unspecified severe protein-calorie malnutrition: Secondary | ICD-10-CM

## 2017-01-29 DIAGNOSIS — F29 Unspecified psychosis not due to a substance or known physiological condition: Secondary | ICD-10-CM | POA: Diagnosis not present

## 2017-01-29 DIAGNOSIS — G309 Alzheimer's disease, unspecified: Secondary | ICD-10-CM

## 2017-01-29 DIAGNOSIS — E538 Deficiency of other specified B group vitamins: Secondary | ICD-10-CM

## 2017-01-29 DIAGNOSIS — L89154 Pressure ulcer of sacral region, stage 4: Secondary | ICD-10-CM

## 2017-01-29 NOTE — Progress Notes (Signed)
Location:   [facility] Room Number: 116 A Place of Service:  SNF (31)   CODE STATUS: DNR  Allergies  Allergen Reactions  . Penicillins Other (See Comments)    Pt family and pt does not know the reaction. Per records, she has tolerated Rocephin.    Chief Complaint  Patient presents with  . Hospitalization Follow-up    Hospital follow up    HPI:  She has been hospitalized from 01-19-17 to 01-28-17: for sepsis secondary to infection of necrotic sacral decubitus; without signs of osteomyelitis per x-ray.  She was treated with vancomycin and cefepime and was changed to septra for total 7 days post hospitalization. She was given one unit of PRBCs with her hgb stabilizing.  Per her family she is having up to 10 foul stools per day which are watery. She is unable to participate in the hpi or ros. She does appear to have pain with movement.  She will to follow up with cbc; and bmp.    Past Medical History:  Diagnosis Date  . A-fib ([place])   . Alzheimer's dementia   . Cancer colon     Past Surgical History:  Procedure Laterality Date  . APPENDECTOMY    . CHOLECYSTECTOMY    . COLON SURGERY    . TONSILLECTOMY      Social History   Social History  . Marital status: Widowed    Spouse name: N/A  . Number of children: N/A  . Years of education: N/A   Occupational History  . Not on file.   Social History Main Topics  . Smoking status: Never Smoker  . Smokeless tobacco: Never Used  . Alcohol use No  . Drug use: No  . Sexual activity: No   Other Topics Concern  . Not on file   Social History Narrative  . No narrative on file   Family History  Problem Relation Age of Onset  . Family history unknown: Yes      VITAL SIGNS BP 140/74   Pulse 92   Temp 97.7 F (36.5 C)   Resp 18   Ht 5\' 6"  (1.676 m)   Wt 122 lb 3.2 oz (55.4 kg)   SpO2 98%   BMI 19.72 kg/m    Patient's Medications  New Prescriptions   No medications on file  Previous Medications   ACETAMINOPHEN (TYLENOL) 325 MG TABLET    Take 2 tablets (650 mg total) by mouth every 6 (six) hours as needed for mild pain (or Fever >/= 101).   ASPIRIN EC 81 MG TABLET    Take 81 mg by mouth daily as needed for mild pain (hemorrhoids).   BISACODYL (DULCOLAX) 10 MG SUPPOSITORY    Place 1 suppository (10 mg total) rectally daily as needed for moderate constipation.   CYANOCOBALAMIN (VITAMIN B-12 CR PO)    Take 1 tablet by mouth daily.   DONEPEZIL (ARICEPT) 10 MG TABLET    Take 10 mg by mouth at bedtime.   FERROUS SULFATE 325 (65 FE) MG TABLET    Take 1 tablet (325 mg total) by mouth daily with breakfast.   MEMANTINE (NAMENDA) 10 MG TABLET    Take 10 mg by mouth 2 (two) times daily.   MULTIPLE VITAMINS-MINERALS (DECUBI-VITE) CAPS    Take 1 capsule by mouth daily.   NUTRITIONAL SUPPLEMENTS (NUTRITIONAL SUPPLEMENT PO)    House 2.0 - [employer] - give 120cc three times a day for supplement   NUTRITIONAL SUPPLEMENTS (NUTRITIONAL SUPPLEMENT PO)  HSG Regular diet -  HSG Dysphagia Ground texture, regular consistency   OXYCODONE (OXY IR/ROXICODONE) 5 MG IMMEDIATE RELEASE TABLET    Take 1 tablet (5 mg total) by mouth every 4 (four) hours as needed for moderate pain.   POLYETHYLENE GLYCOL (MIRALAX / GLYCOLAX) PACKET    Take 17 g by mouth daily as needed for mild constipation.   QUETIAPINE (SEROQUEL) 25 MG TABLET    Take 37.5 mg by mouth at bedtime.   SULFAMETHOXAZOLE-TRIMETHOPRIM (BACTRIM DS,SEPTRA DS) 800-160 MG TABLET    Take 1 tablet by mouth every 12 (twelve) hours.   VITAMIN D, ERGOCALCIFEROL, (DRISDOL) 50000 UNITS CAPS CAPSULE    Take 50,000 Units by mouth every 7 (seven) days. On Wednesday  Modified Medications   No medications on file  Discontinued Medications   COLLAGENASE (SANTYL) OINTMENT    Apply topically 2 (two) times daily.   FEEDING SUPPLEMENT, ENSURE ENLIVE, (ENSURE ENLIVE) LIQD    Take 237 mLs by mouth 2 (two) times daily between meals.   MULTIPLE VITAMIN (MULTIVITAMIN) LIQD    Take 15 mLs  by mouth daily.     SIGNIFICANT DIAGNOSTIC EXAMS  TODAY:   01-19-17:  Chest x-ray: 1. No active cardiopulmonary disease. 2. Stable cardiomegaly with aortic atherosclerosis. 3. Osteoarthritis of both shoulders.  01-19-17: x-ray: sacrum/coccyx: 1. No active cardiopulmonary disease. 2. Stable cardiomegaly with aortic atherosclerosis. 3. Osteoarthritis of both shoulders.   LABS REVIEWED: TODAY:   01-19-17: wbc 14.6; hgb 9.2; hct 29.2; mcv 89.6; plt 233; abs neutro: 12.8 glucose 116; bun 11; creat 0.76; k+ 3.3; na++ 139; ca 9.2; liver normal albumin 1.9; urine culture: mixed flora; blood culture: no growth; lactic acid 1.54 01-20-17: wbc 15.8; hgb 8.5; hct 27.2; mcv 90.7; plt 218; glucose 106; bun 11; creat 0.66; k+ 3.6; na++ 141; alt 13; albumin 1.8 phos 2.9; mag 1.4; pre-albumin <5; tsh 0.807 01-22-17: wbc 16.3; hgb 7.1; hct 22.3; mcv 87.1; plt 214; iron 8; tibc 125; ferritin 206; vit B 12: 301; folate 11 01-25-17: wbc 12.4; hgb 10.8; hct 31.8; mcv 85.9; plt 338; glucose 92; bun 9; creat 0.58; k+ 3.8; na++ 136; ca 9.1 01-28-17: wbc 12.;4 hgb 10.4; hct 32.5; mcv 88.6; plt 508; glucose 93; bun 8; creat 0.77; k+ 4.2; na++ 137; ca 9.6  Review of Systems  Unable to perform ROS: Dementia (unable to answer questions )    Physical Exam  Constitutional: No distress.  Frail   Eyes: Conjunctivae are normal.  Neck: Neck supple. No JVD present. No thyromegaly present.  Cardiovascular: Normal rate, regular rhythm and intact distal pulses.   Respiratory: Effort normal and breath sounds normal. No respiratory distress. She has no wheezes.  GI: Soft. Bowel sounds are normal. She exhibits no distension. There is no tenderness.  Musculoskeletal: She exhibits no edema.  Is not moving lower extremities  Is able to move upper extremities   Lymphadenopathy:    She has no cervical adenopathy.  Neurological:  aware  Skin: Skin is warm and dry. She is not diaphoretic.  Coccyx: stage IV: 8.0 x 4.6 x 2.3 cm  with undermining: 5.9 cm at 7 o'clock 30%  Thick adherent necrotic tissue  30% fascia 40% granulation  Right heel: DTI: 2.3 x 4 cm  Left heel: DTI: 3.2 x 5 cm  Left hip: DTI: 3.2 x 2.5 cm Left plantar foot: callous: 2 x 2 cm   Psychiatric:  Is calm     ASSESSMENT/ PLAN:  TODAY:   1. Chronic afib: hear  rate is stable will continue asa 81 mg daily   1. Alzheimer's disease: no change in status: weight is 122 pounds. Will continue namenda 10 mg twice daily and aricept 10 mg nightly  3.  Decubitus stage IV of coccyx:no change  will continue treatment per facility wound Dr. Aletha Halim change tylenol to 650 mg four times daily for pain management and will stop oxycodone    4. Anemia of chronic disease:  Is stable; is status post 1 unit PRBC hgb is 10.4. Will continue iron daily   5. Vit B 12 deficiency: vit B 12: 301; will continue vit B 12 1000 mcg daily  6. malnutrition of severe degree:  Albumin 1.8 with pre-albumin <5: will continue supplements as directed; will begin pro-mod 1 scoop twice daily   7. Sepsis: due to wound infection: is without change  will compete bactrim and will continue to monitor  8. Psychosis: no change in status will continue seroquel 37.5 mg nightly   Will check cbc; cmp   MD is aware of resident's narcotic use and is in agreement with current plan of care. We will attempt to wean resident as apropriate   Ok Edwards NP [facility]  Contact [phone] Monday through Friday 8am- 5pm  After hours call [phone]

## 2017-01-30 ENCOUNTER — Encounter: Payer: Self-pay | Admitting: Adult Health

## 2017-01-30 NOTE — Progress Notes (Signed)
Location:   [facility] Room Number: 116 A Place of Service:  SNF (31)   CODE STATUS: DNR  Allergies  Allergen Reactions  . Penicillins Other (See Comments)    Pt family and pt does not know the reaction. Per records, she has tolerated Rocephin.    Chief Complaint  Patient presents with  . Acute Visit    Care Plan Meeting    HPI:    Past Medical History:  Diagnosis Date  . A-fib ([place])   . Alzheimer's dementia   . Cancer colon     Past Surgical History:  Procedure Laterality Date  . APPENDECTOMY    . CHOLECYSTECTOMY    . COLON SURGERY    . TONSILLECTOMY      Social History   Social History  . Marital status: Widowed    Spouse name: N/A  . Number of children: N/A  . Years of education: N/A   Occupational History  . Not on file.   Social History Main Topics  . Smoking status: Never Smoker  . Smokeless tobacco: Never Used  . Alcohol use No  . Drug use: No  . Sexual activity: No   Other Topics Concern  . Not on file   Social History Narrative  . No narrative on file   Family History  Problem Relation Age of Onset  . Family history unknown: Yes      VITAL SIGNS BP 140/74   Pulse 92   Temp 97.7 F (36.5 C)   Resp 18   Ht 5\' 6"  (1.676 m)   Wt 122 lb 3.2 oz (55.4 kg)   SpO2 98%   BMI 19.72 kg/m   Patient's Medications  New Prescriptions   No medications on file  Previous Medications   ACETAMINOPHEN (TYLENOL) 325 MG TABLET    Take 2 tablets (650 mg total) by mouth every 6 (six) hours as needed for mild pain (or Fever >/= 101).   ASPIRIN EC 81 MG TABLET    Take 81 mg by mouth daily as needed for mild pain (hemorrhoids).   BISACODYL (DULCOLAX) 10 MG SUPPOSITORY    Place 1 suppository (10 mg total) rectally daily as needed for moderate constipation.   CYANOCOBALAMIN (VITAMIN B-12 CR PO)    Take 1 tablet by mouth daily.   DONEPEZIL (ARICEPT) 10 MG TABLET    Take 10 mg by mouth at bedtime.   FERROUS SULFATE 325 (65 FE) MG TABLET     Take 1 tablet (325 mg total) by mouth daily with breakfast.   MEMANTINE (NAMENDA) 10 MG TABLET    Take 10 mg by mouth 2 (two) times daily.   MULTIPLE VITAMINS-MINERALS (DECUBI-VITE) CAPS    Take 1 capsule by mouth daily.   NUTRITIONAL SUPPLEMENTS (NUTRITIONAL SUPPLEMENT PO)    House 2.0 - [employer] - give 120cc three times a day for supplement   NUTRITIONAL SUPPLEMENTS (NUTRITIONAL SUPPLEMENT PO)    HSG Regular diet -  HSG Dysphagia Ground texture, regular consistency   POLYETHYLENE GLYCOL (MIRALAX / GLYCOLAX) PACKET    Take 17 g by mouth daily as needed for mild constipation.   PROTEIN SUPPLEMENT (PROMOD) POWD    Take 1 scoop by mouth 2 (two) times daily.   QUETIAPINE (SEROQUEL) 25 MG TABLET    Take 37.5 mg by mouth at bedtime.   SULFAMETHOXAZOLE-TRIMETHOPRIM (BACTRIM DS,SEPTRA DS) 800-160 MG TABLET    Take 1 tablet by mouth every 12 (twelve) hours.   VITAMIN D, ERGOCALCIFEROL, (DRISDOL) 50000  UNITS CAPS CAPSULE    Take 50,000 Units by mouth every 7 (seven) days. On Wednesday  Modified Medications   No medications on file  Discontinued Medications   OXYCODONE (OXY IR/ROXICODONE) 5 MG IMMEDIATE RELEASE TABLET    Take 1 tablet (5 mg total) by mouth every 4 (four) hours as needed for moderate pain.     SIGNIFICANT DIAGNOSTIC EXAMS  TODAY:   01-19-17:  Chest x-ray: 1. No active cardiopulmonary disease. 2. Stable cardiomegaly with aortic atherosclerosis. 3. Osteoarthritis of both shoulders.  01-19-17: x-ray: sacrum/coccyx: 1. No active cardiopulmonary disease. 2. Stable cardiomegaly with aortic atherosclerosis. 3. Osteoarthritis of both shoulders.   LABS REVIEWED: TODAY:   01-19-17: wbc 14.6; hgb 9.2; hct 29.2; mcv 89.6; plt 233; abs neutro: 12.8 glucose 116; bun 11; creat 0.76; k+ 3.3; na++ 139; ca 9.2; liver normal albumin 1.9; urine culture: mixed flora; blood culture: no growth; lactic acid 1.54 01-20-17: wbc 15.8; hgb 8.5; hct 27.2; mcv 90.7; plt 218; glucose 106; bun 11; creat 0.66; k+  3.6; na++ 141; alt 13; albumin 1.8 phos 2.9; mag 1.4; pre-albumin <5; tsh 0.807 01-22-17: wbc 16.3; hgb 7.1; hct 22.3; mcv 87.1; plt 214; iron 8; tibc 125; ferritin 206; vit B 12: 301; folate 11 01-25-17: wbc 12.4; hgb 10.8; hct 31.8; mcv 85.9; plt 338; glucose 92; bun 9; creat 0.58; k+ 3.8; na++ 136; ca 9.1 01-28-17: wbc 12.;4 hgb 10.4; hct 32.5; mcv 88.6; plt 508; glucose 93; bun 8; creat 0.77; k+ 4.2; na++ 137; ca 9.6  Review of Systems  Unable to perform ROS: Dementia (unable to answer questions )    Physical Exam  Constitutional: No distress.  Frail   Eyes: Conjunctivae are normal.  Neck: Neck supple. No JVD present. No thyromegaly present.  Cardiovascular: Normal rate, regular rhythm and intact distal pulses.   Respiratory: Effort normal and breath sounds normal. No respiratory distress. She has no wheezes.  GI: Soft. Bowel sounds are normal. She exhibits no distension. There is no tenderness.  Musculoskeletal: She exhibits no edema.  Is not moving lower extremities  Is able to move upper extremities   Lymphadenopathy:    She has no cervical adenopathy.  Neurological:  aware  Skin: Skin is warm and dry. She is not diaphoretic.  Coccyx: stage IV: 8.0 x 4.6 x 2.3 cm with undermining: 5.9 cm at 7 o'clock 30%  Thick adherent necrotic tissue  30% fascia 40% granulation  Right heel: DTI: 2.3 x 4 cm  Left heel: DTI: 3.2 x 5 cm  Left hip: DTI: 3.2 x 2.5 cm Left plantar foot: callous: 2 x 2 cm   Psychiatric:  Is calm     ASSESSMENT/ PLAN:  TODAY:   1. Chronic afib: hear rate is stable will continue asa 81 mg daily   1. Alzheimer's disease: no change in status: weight is 122 pounds. Will continue namenda 10 mg twice daily and aricept 10 mg nightly  3.  Decubitus stage IV of coccyx:no change  will continue treatment per facility wound Dr. Aletha Halim change tylenol to 650 mg four times daily for pain management and will stop oxycodone    4. Anemia of chronic disease:  Is stable; is  status post 1 unit PRBC hgb is 10.4. Will continue iron daily   5. Vit B 12 deficiency: vit B 12: 301; will continue vit B 12 1000 mcg daily  6. malnutrition of severe degree:  Albumin 1.8 with pre-albumin <5: will continue supplements as directed; will begin pro-mod  1 scoop twice daily   7. Sepsis: due to wound infection: is without change  will compete bactrim and will continue to monitor  8. Psychosis: no change in status will continue seroquel 37.5 mg nightly   Will check cbc; cmp    MD is aware of resident's narcotic use and is in agreement with current plan of care. We will attempt to wean resident as apropriate     Ok Edwards NP [facility]  Contact [phone] Monday through Friday 8am- 5pm  After hours call [phone]

## 2017-01-31 ENCOUNTER — Encounter: Payer: Self-pay | Admitting: Internal Medicine

## 2017-01-31 ENCOUNTER — Non-Acute Institutional Stay (SKILLED_NURSING_FACILITY): Payer: Medicare Other | Admitting: Internal Medicine

## 2017-01-31 DIAGNOSIS — G309 Alzheimer's disease, unspecified: Secondary | ICD-10-CM

## 2017-01-31 DIAGNOSIS — F29 Unspecified psychosis not due to a substance or known physiological condition: Secondary | ICD-10-CM | POA: Diagnosis not present

## 2017-01-31 DIAGNOSIS — L89154 Pressure ulcer of sacral region, stage 4: Secondary | ICD-10-CM

## 2017-01-31 DIAGNOSIS — E43 Unspecified severe protein-calorie malnutrition: Secondary | ICD-10-CM | POA: Diagnosis not present

## 2017-01-31 DIAGNOSIS — I480 Paroxysmal atrial fibrillation: Secondary | ICD-10-CM | POA: Diagnosis not present

## 2017-01-31 DIAGNOSIS — F0281 Dementia in other diseases classified elsewhere with behavioral disturbance: Secondary | ICD-10-CM

## 2017-01-31 DIAGNOSIS — D638 Anemia in other chronic diseases classified elsewhere: Secondary | ICD-10-CM

## 2017-01-31 NOTE — Progress Notes (Signed)
Patient ID: Kathleen [name], female   DOB: 04/28/1926, 83 y.o.   MRN: [id number]     HISTORY AND PHYSICAL   DATE:  January 31, 2017  Location:   [facility] Room Number: 116 A Place of Service: SNF (31)   Extended Emergency Contact Information Primary Emergency Contact: [place] Mobile Phone: [phone] Relation: Daughter  Advanced Directive information Does Patient Have a Medical Advance Directive?: Yes, Would patient like information on creating a medical advance directive?: No - Patient declined, Type of Advance Directive: Out of facility DNR (pink MOST or yellow form), Does patient want to make changes to medical advance directive?: No - Patient declined  Chief Complaint  Patient presents with  . New Admit To SNF    Admission    HPI:  83 yo female seen today as a new admission into SNF following hospital stay for acute encephalopathy, sepsis 2/2 stage 4 sacral decubitus ulcer, hypokalemia, hypoalbuminemia, anemia, afib, hx colon CA, dementia with behavioral change, recurrent UTI. She presented to the ED with increased confusion, refusal to eat, string urine odor, strong smell coming from sacral decub. Per family, she had been bed bound x several weeks prior to admission. She was tx with broad spec IV abx. General sx performed bedside debridement of sacral wound on 01/22/17. Consults also included palliative care and wound care. She did receive hydrotherapy. Wound continued to have foul odor and was constantly contaminated with stool. She rec'd 1 unit PRBCs. She was d/c'd on Bactrim BID x 7 days. Palliative recommended to reassess status in 1-2 weeks and if no improvement, t/c comfort care 2/2 contaminated wound. K dropped 3.2-->4.2; Hgb dropped 7.1-->10.4; Cr 0.76; PLts 508K; Mg 1.4; albumin 1.8; WBC peaked 16.3K-->12.4K; abs neutrophils 12.8K; Vit B12 level 301 at d/c. She presents to SNF for short term rehab with potential for long term  care.  Today she is lethargic and states she does not feel well. She has pain in her buttock. No f/c. No N/V. Appetite is poor. She sleeps a lot. No falls. She is a poor historian due to dementia. Hx obtained from chart.  PAF - rate controlled without meds at this time. She is a fall risk and not a candidate for anticoagulation. She does take ASA 81 mg daily   Alzheimer's disease - stable. Current wt is 122 lb. Takes namenda 10 mg twice daily and aricept 10 mg nightly; she has psychosis and takes seroquel 37.5 mg qhs  Decubitus stage IV of coccyx - unchanged. Followed by facility wound Dr. Dewaine Conger tylenol 650 mg four times daily for pain management. Wound still dirty and foul smelling   Anemia of chronic disease - stable. Hgb 10.4 at d/c after 1 unit PRBC. Takes iron daily   Vit B 12 deficiency - stable on vit B12 at 1000 mcg daily. B12 level 301  Protein calorie malnutrition of severe degree -  Albumin 1.8 with pre-albumin <5: she gets nutritional supplements per facility protocol and pro-mod 1 scoop twice daily    Past Medical History:  Diagnosis Date  . A-fib ([place])   . Alzheimer's dementia   . Cancer colon     Past Surgical History:  Procedure Laterality Date  . APPENDECTOMY    . CHOLECYSTECTOMY    . COLON SURGERY    . TONSILLECTOMY      Patient Care Team: Jani Gravel, MD as PCP - General (Internal Medicine)  Social History   Social History  .  Marital status: Widowed    Spouse name: N/A  . Number of children: N/A  . Years of education: N/A   Occupational History  . Not on file.   Social History Main Topics  . Smoking status: Never Smoker  . Smokeless tobacco: Never Used  . Alcohol use No  . Drug use: No  . Sexual activity: No   Other Topics Concern  . Not on file   Social History Narrative  . No narrative on file     reports that she has never smoked. She has never used smokeless tobacco. She reports that she does not drink alcohol or use drugs.  Family  History  Problem Relation Age of Onset  . Family history unknown: Yes   No family status information on file.    Immunization History  Administered Date(s) Administered  . PPD Test 01/28/2017    Allergies  Allergen Reactions  . Penicillins Other (See Comments)    Pt family and pt does not know the reaction. Per records, she has tolerated Rocephin.    Medications: Patient's Medications  New Prescriptions   No medications on file  Previous Medications   ACETAMINOPHEN (TYLENOL) 325 MG TABLET    Take 2 tablets (650 mg total) by mouth every 6 (six) hours as needed for mild pain (or Fever >/= 101).   AMINO ACIDS-PROTEIN HYDROLYS (FEEDING SUPPLEMENT, PRO-STAT SUGAR FREE 64,) LIQD    Take 30 mLs by mouth 2 (two) times daily.   ASPIRIN EC 81 MG TABLET    Take 81 mg by mouth daily as needed for mild pain (hemorrhoids).   BISACODYL (DULCOLAX) 10 MG SUPPOSITORY    Place 1 suppository (10 mg total) rectally daily as needed for moderate constipation.   CHOLESTYRAMINE (QUESTRAN) 4 G PACKET    Give 1 packet by mouth 2 hoursbefore meals for wound healing   CYANOCOBALAMIN (VITAMIN B-12 CR PO)    Take 1 tablet by mouth daily.   DONEPEZIL (ARICEPT) 10 MG TABLET    Take 10 mg by mouth at bedtime.   FERROUS SULFATE 325 (65 FE) MG TABLET    Take 1 tablet (325 mg total) by mouth daily with breakfast.   MEMANTINE (NAMENDA) 10 MG TABLET    Take 10 mg by mouth 2 (two) times daily.   MULTIPLE VITAMINS-MINERALS (DECUBI-VITE) CAPS    Take 1 capsule by mouth daily.   NUTRITIONAL SUPPLEMENTS (NUTRITIONAL SUPPLEMENT PO)    House 2.0 - [employer] - give 120cc three times a day for supplement   NUTRITIONAL SUPPLEMENTS (NUTRITIONAL SUPPLEMENT PO)    HSG Regular diet -  HSG Dysphagia Ground texture, regular consistency   POLYETHYLENE GLYCOL (MIRALAX / GLYCOLAX) PACKET    Take 17 g by mouth daily as needed for mild constipation.   QUETIAPINE (SEROQUEL) 25 MG TABLET    Take 25 mg by mouth at bedtime.     SULFAMETHOXAZOLE-TRIMETHOPRIM (BACTRIM DS,SEPTRA DS) 800-160 MG TABLET    Take 1 tablet by mouth every 12 (twelve) hours.   VITAMIN D, ERGOCALCIFEROL, (DRISDOL) 50000 UNITS CAPS CAPSULE    Take 50,000 Units by mouth every 7 (seven) days. On Wednesday  Modified Medications   No medications on file  Discontinued Medications   PROTEIN SUPPLEMENT (PROMOD) POWD    Take 1 scoop by mouth 2 (two) times daily.    Review of Systems  Unable to perform ROS: Dementia    Vitals:   01/31/17 0938  BP: 140/74  Pulse: 92  Resp: 18  Temp:  97.8 F (36.6 C)  SpO2: 98%  Weight: 122 lb 3.2 oz (55.4 kg)  Height: 5\' 6"  (1.676 m)   Body mass index is 19.72 kg/m.  Physical Exam  Constitutional: She appears well-developed.  Frail appearing in NAD, lying in bed on her side, asleep but easily aroused  HENT:  Mouth/Throat: Oropharynx is clear and moist. No oropharyngeal exudate.  MMM; no oral thrush  Eyes: Pupils are equal, round, and reactive to light. No scleral icterus.  Neck: Neck supple. Carotid bruit is not present. No tracheal deviation present.  Cardiovascular: Normal rate, regular rhythm and intact distal pulses.  Exam reveals no gallop and no friction rub.   Murmur (1/6 SEM) heard. No LE edema b/l. No calf TTP. Unna boot intact b/l.  Pulmonary/Chest: Effort normal and breath sounds normal. No stridor. No respiratory distress. She has no wheezes. She has no rales.  Abdominal: Soft. Normal appearance and bowel sounds are normal. She exhibits no distension and no mass. There is no hepatomegaly. There is no tenderness. There is no rigidity, no rebound and no guarding. No hernia.  Musculoskeletal: She exhibits edema.  Lymphadenopathy:    She has no cervical adenopathy.  Neurological: She is alert.  Skin: Skin is warm and dry. No rash noted.  Left hip/sacral stage 4 pressure ulcer per wound care, foul smelling  Psychiatric: She has a normal mood and affect. Her behavior is normal.     Labs  reviewed: Admission on 01/19/2017, Discharged on 01/28/2017  No results displayed because visit has over 200 results.  CBC Latest Ref Rng & Units 01/28/2017 01/27/2017 01/26/2017  WBC 4.0 - 10.5 K/uL 12.4(H) 13.3(H) 12.7(H)  Hemoglobin 12.0 - 15.0 g/dL 10.4(L) 11.3(L) 10.6(L)  Hematocrit 36.0 - 46.0 % 32.5(L) 34.6(L) 32.7(L)  Platelets 150 - 400 K/uL 508(H) 481(H) 416(H)   CMP Latest Ref Rng & Units 01/28/2017 01/27/2017 01/26/2017  Glucose 65 - 99 mg/dL 93 89 96  BUN 6 - 20 mg/dL 8 8 7   Creatinine 0.44 - 1.00 mg/dL 0.77 0.63 0.76  Sodium 135 - 145 mmol/L 137 138 138  Potassium 3.5 - 5.1 mmol/L 4.2 3.7 3.9  Chloride 101 - 111 mmol/L 104 104 106  CO2 22 - 32 mmol/L 26 26 29   Calcium 8.9 - 10.3 mg/dL 9.6 9.7 9.3  Total Protein 6.5 - 8.1 g/dL - - -  Total Bilirubin 0.3 - 1.2 mg/dL - - -  Alkaline Phos 38 - 126 U/L - - -  AST 15 - 41 U/L - - -  ALT 14 - 54 U/L - - -       Dg Chest 2 View  Result Date: 01/19/2017 CLINICAL DATA:  Increased confusion over the past several days. EXAM: CHEST  2 VIEW COMPARISON:  06/22/2015 FINDINGS: Stable cardiomegaly with aortic atherosclerosis. No acute pneumonic consolidation, effusion or pneumothorax. No overt pulmonary edema. Osteoarthritis of both AC and glenohumeral joints with high-riding humeral heads consistent with chronic rotator cuff tears. IMPRESSION: 1. No active cardiopulmonary disease. 2. Stable cardiomegaly with aortic atherosclerosis. 3. Osteoarthritis of both shoulders. Electronically Signed   By: Ashley Royalty M.D.   On: 01/19/2017 22:19   Dg Sacrum/coccyx  Result Date: 01/19/2017 CLINICAL DATA:  Decubitus ulcer EXAM: SACRUM AND COCCYX - 2+ VIEW COMPARISON:  CT from 06/12/2012. FINDINGS: Sclerosis about both SI joints consistent with osteoarthritis. Surgical clips project over the mid pelvis. Soft tissue ulceration over the the dorsum of the mid sacrum. No conclusive evidence for acute osteomyelitis. Bones however  are slightly demineralized in  appearance limiting assessment. There is lower lumbar degenerative disc disease from L4 through S1. IMPRESSION: 1. Lower lumbar spondylosis with degenerative disc disease. 2. Decubitus ulcer overlying the dorsum of the mid sacrum without definite radiographic evidence of acute osteomyelitis. Electronically Signed   By: Ashley Royalty M.D.   On: 01/19/2017 22:21     Assessment/Plan   ICD-10-CM   1. Decubitus ulcer of coccyx, stage IV ([facility]) L89.154    failing to change as expected  2. Severe malnutrition ([place]) E43   3. Alzheimer's dementia with behavioral disturbance, unspecified timing of dementia onset G30.9    F02.81   4. Psychosis, unspecified psychosis type F29   5. PAF (paroxysmal atrial fibrillation) ([facility]) I48.0   6. Anemia of chronic disease D63.8     Palliative care referral  Cont current meds as ordered  PT/Ot/ST as tolerated  Wound care as ordered  GOAL: short term rehab with potential for long term care. Prognosis poor overall. Communicated with pt and nursing.  Will follow  Keywon Mestre S. Perlie Gold  [facility] [address] [place], [place] [address] [phone] Cell (Monday-Friday 8 AM - 5 PM) [phone] After 5 PM and follow prompts

## 2017-02-01 ENCOUNTER — Encounter: Payer: Self-pay | Admitting: Adult Health

## 2017-02-01 ENCOUNTER — Non-Acute Institutional Stay (SKILLED_NURSING_FACILITY): Payer: Medicare Other | Admitting: Adult Health

## 2017-02-01 DIAGNOSIS — Z7189 Other specified counseling: Secondary | ICD-10-CM | POA: Diagnosis not present

## 2017-02-01 DIAGNOSIS — T148XXA Other injury of unspecified body region, initial encounter: Secondary | ICD-10-CM | POA: Diagnosis not present

## 2017-02-01 DIAGNOSIS — F0281 Dementia in other diseases classified elsewhere with behavioral disturbance: Secondary | ICD-10-CM

## 2017-02-01 DIAGNOSIS — G309 Alzheimer's disease, unspecified: Secondary | ICD-10-CM

## 2017-02-01 DIAGNOSIS — L89154 Pressure ulcer of sacral region, stage 4: Secondary | ICD-10-CM | POA: Diagnosis not present

## 2017-02-01 NOTE — Progress Notes (Signed)
Location:   [facility] Room Number: 116 A Place of Service:  SNF (31)    CODE STATUS: DNR  Allergies  Allergen Reactions  . Penicillins Other (See Comments)    Pt family and pt does not know the reaction. Per records, she has tolerated Rocephin.    Chief Complaint  Patient presents with  . Discharge Note    Discharging to home    HPI:  She is being discharged to home with family with home health for pt/ot/rn/cna/sw. She will need a semi-electric bed with air mattress. Her family is aware that she requires 24 hour care and are able to provide this. She will need to follow up with her medical provider.  She had been hospitalized for sepsis from her stage IV sacral ulceration; requiring pulse lavage. She did receive wound care; and therapy including speech therapy. Her insurance decided that she has achieved all that she can and she is going home.   Past Medical History:  Diagnosis Date  . A-fib ([place])   . Alzheimer's dementia   . Cancer colon     Past Surgical History:  Procedure Laterality Date  . APPENDECTOMY    . CHOLECYSTECTOMY    . COLON SURGERY    . TONSILLECTOMY      Social History   Social History  . Marital status: Widowed    Spouse name: N/A  . Number of children: N/A  . Years of education: N/A   Occupational History  . Not on file.   Social History Main Topics  . Smoking status: Never Smoker  . Smokeless tobacco: Never Used  . Alcohol use No  . Drug use: No  . Sexual activity: No   Other Topics Concern  . Not on file   Social History Narrative  . No narrative on file   Family History  Problem Relation Age of Onset  . Family history unknown: Yes      VITAL SIGNS BP 136/72   Pulse 88   Temp 98.4 F (36.9 C)   Resp 16   Ht 5\' 6"  (1.676 m)   Wt 122 lb 3.2 oz (55.4 kg)   SpO2 99%   BMI 19.72 kg/m   Patient's Medications  New Prescriptions   No medications on file  Previous Medications   ACETAMINOPHEN (TYLENOL) 325  MG TABLET    Take 2 tablets (650 mg total) by mouth every 6 (six) hours as needed for mild pain (or Fever >/= 101).   AMINO ACIDS-PROTEIN HYDROLYS (FEEDING SUPPLEMENT, PRO-STAT SUGAR FREE 64,) LIQD    Take 30 mLs by mouth 2 (two) times daily.   ASPIRIN EC 81 MG TABLET    Take 81 mg by mouth daily as needed for mild pain (hemorrhoids).   BISACODYL (DULCOLAX) 10 MG SUPPOSITORY    Place 1 suppository (10 mg total) rectally daily as needed for moderate constipation.   CHOLESTYRAMINE (QUESTRAN) 4 G PACKET    Give 1 packet by mouth 2 hoursbefore meals for wound healing   CYANOCOBALAMIN (VITAMIN B-12 CR PO)    Take 1,000 mcg by mouth daily.    DONEPEZIL (ARICEPT) 10 MG TABLET    Take 10 mg by mouth at bedtime.   FERROUS SULFATE 325 (65 FE) MG TABLET    Take 1 tablet (325 mg total) by mouth daily with breakfast.   MEMANTINE (NAMENDA) 10 MG TABLET    Take 10 mg by mouth 2 (two) times daily.   MULTIPLE VITAMINS-MINERALS (DECUBI-VITE) CAPS  Take 1 capsule by mouth daily.   NUTRITIONAL SUPPLEMENTS (NUTRITIONAL SUPPLEMENT PO)    House 2.0 - [employer] - give 120cc three times a day for supplement   NUTRITIONAL SUPPLEMENTS (NUTRITIONAL SUPPLEMENT PO)    HSG Regular diet -  HSG Dysphagia Ground texture, regular consistency   POLYETHYLENE GLYCOL (MIRALAX / GLYCOLAX) PACKET    Take 17 g by mouth daily as needed for mild constipation.   QUETIAPINE (SEROQUEL) 25 MG TABLET    Take 25 mg by mouth at bedtime.    SULFAMETHOXAZOLE-TRIMETHOPRIM (BACTRIM DS,SEPTRA DS) 800-160 MG TABLET    Take 1 tablet by mouth every 12 (twelve) hours.   VITAMIN D, ERGOCALCIFEROL, (DRISDOL) 50000 UNITS CAPS CAPSULE    Take 50,000 Units by mouth every 7 (seven) days. On Wednesday  Modified Medications   No medications on file  Discontinued Medications   No medications on file     SIGNIFICANT DIAGNOSTIC EXAMS  PREVIOUS  01-19-17:  Chest x-ray: 1. No active cardiopulmonary disease. 2. Stable cardiomegaly with aortic  atherosclerosis. 3. Osteoarthritis of both shoulders.  01-19-17: x-ray: sacrum/coccyx: 1. No active cardiopulmonary disease. 2. Stable cardiomegaly with aortic atherosclerosis. 3. Osteoarthritis of both shoulders.  NO NEW EXAMS    LABS REVIEWED: PREVIOUS:   01-19-17: wbc 14.6; hgb 9.2; hct 29.2; mcv 89.6; plt 233; abs neutro: 12.8 glucose 116; bun 11; creat 0.76; k+ 3.3; na++ 139; ca 9.2; liver normal albumin 1.9; urine culture: mixed flora; blood culture: no growth; lactic acid 1.54 01-20-17: wbc 15.8; hgb 8.5; hct 27.2; mcv 90.7; plt 218; glucose 106; bun 11; creat 0.66; k+ 3.6; na++ 141; alt 13; albumin 1.8 phos 2.9; mag 1.4; pre-albumin <5; tsh 0.807 01-22-17: wbc 16.3; hgb 7.1; hct 22.3; mcv 87.1; plt 214; iron 8; tibc 125; ferritin 206; vit B 12: 301; folate 11 01-25-17: wbc 12.4; hgb 10.8; hct 31.8; mcv 85.9; plt 338; glucose 92; bun 9; creat 0.58; k+ 3.8; na++ 136; ca 9.1 01-28-17: wbc 12.;4 hgb 10.4; hct 32.5; mcv 88.6; plt 508; glucose 93; bun 8; creat 0.77; k+ 4.2; na++ 137; ca 9.6  NO NEW EXAMS   Review of Systems  Unable to perform ROS: Dementia (unable to answer questions )   Physical Exam  Constitutional: No distress.  Frail   Eyes: Conjunctivae are normal.  Neck: Neck supple. No JVD present. No thyromegaly present.  Cardiovascular: Normal rate, regular rhythm and intact distal pulses.   Respiratory: Effort normal and breath sounds normal. No respiratory distress. She has no wheezes.  GI: Soft. Bowel sounds are normal. She exhibits no distension. There is no tenderness.  Musculoskeletal: She exhibits no edema.  Not able to move lower extremities Is able to move upper extremities   Lymphadenopathy:    She has no cervical adenopathy.  Neurological: She is alert.  Skin: Skin is warm and dry. She is not diaphoretic.  Sacrum stage IV; 30% necrotic 8 x 4.6 x 2.3 cm Right heel DTI 2 x 4 cm Left medial heel: DTI 3.2 x 5 cm Left hip: DTI: 3.2 x 2.5 cm   Psychiatric: She has a  normal mood and affect.   ASSESSMENT/ PLAN:  She is being discharged to home with 24 hour care and home health: pt/ot/rn/cna/sw: to evaluate and treat for strength; adl care; medication and wound management; and community services  DME: semi-electric hospital bed with air mattress for her stage IV wound; to allow for wound healing and frequent positioning that cannot be achieved with a standard bed.  The facility is aware to setup a follow up appointment within 1-2 weeks post discharge from this facility.  A 30 day supply of her non-narcotic prescriptions have been written according to her medication list as above  There are no narcotic prescriptions written   Time spent with family for discharge process is 45 minutes: including necessary documentation; educating family on deeded dme; discharge expectations  including her home health needs; and her disease process. Her family has verbalized understanding.   MD is aware of resident's narcotic use and is in agreement with current plan of care. We will attempt to wean resident as apropriate   Ok Edwards NP [facility]  Contact [phone] Monday through Friday 8am- 5pm  After hours call [phone]

## 2017-02-04 ENCOUNTER — Emergency Department (HOSPITAL_COMMUNITY)
Admission: EM | Admit: 2017-02-04 | Discharge: 2017-02-04 | Disposition: A | Discharge status: Home / Self Care | Payer: Medicare Other | Attending: Emergency Medicine | Admitting: Emergency Medicine

## 2017-02-04 DIAGNOSIS — G309 Alzheimer's disease, unspecified: Secondary | ICD-10-CM | POA: Diagnosis not present

## 2017-02-04 DIAGNOSIS — L89154 Pressure ulcer of sacral region, stage 4: Secondary | ICD-10-CM | POA: Diagnosis not present

## 2017-02-04 DIAGNOSIS — Z85038 Personal history of other malignant neoplasm of large intestine: Secondary | ICD-10-CM | POA: Diagnosis not present

## 2017-02-04 DIAGNOSIS — I4891 Unspecified atrial fibrillation: Secondary | ICD-10-CM | POA: Insufficient documentation

## 2017-02-04 DIAGNOSIS — Z79899 Other long term (current) drug therapy: Secondary | ICD-10-CM | POA: Insufficient documentation

## 2017-02-04 LAB — CBC WITH DIFFERENTIAL/PLATELET
Basophils Absolute: 0 10*3/uL (ref 0.0–0.1)
Basophils Relative: 0 %
Eosinophils Absolute: 0 10*3/uL (ref 0.0–0.7)
Eosinophils Relative: 0 %
HEMATOCRIT: 35.5 % — AB (ref 36.0–46.0)
HEMOGLOBIN: 11.3 g/dL — AB (ref 12.0–15.0)
LYMPHS ABS: 1.5 10*3/uL (ref 0.7–4.0)
LYMPHS PCT: 15 %
MCH: 28.7 pg (ref 26.0–34.0)
MCHC: 31.8 g/dL (ref 30.0–36.0)
MCV: 90.1 fL (ref 78.0–100.0)
Monocytes Absolute: 0.5 10*3/uL (ref 0.1–1.0)
Monocytes Relative: 5 %
NEUTROS ABS: 7.4 10*3/uL (ref 1.7–7.7)
NEUTROS PCT: 80 %
Platelets: 556 10*3/uL — ABNORMAL HIGH (ref 150–400)
RBC: 3.94 MIL/uL (ref 3.87–5.11)
RDW: 17 % — ABNORMAL HIGH (ref 11.5–15.5)
WBC: 9.4 10*3/uL (ref 4.0–10.5)

## 2017-02-04 LAB — BASIC METABOLIC PANEL
ANION GAP: 8 (ref 5–15)
BUN: 10 mg/dL (ref 6–20)
CO2: 27 mmol/L (ref 22–32)
Calcium: 10.3 mg/dL (ref 8.9–10.3)
Chloride: 98 mmol/L — ABNORMAL LOW (ref 101–111)
Creatinine, Ser: 0.75 mg/dL (ref 0.44–1.00)
GFR calc Af Amer: >60 mL/min (ref >60)
GFR calc non Af Amer: >60 mL/min (ref >60)
GLUCOSE: 96 mg/dL (ref 65–99)
POTASSIUM: 4.2 mmol/L (ref 3.5–5.1)
Sodium: 133 mmol/L — ABNORMAL LOW (ref 135–145)

## 2017-02-04 NOTE — ED Triage Notes (Signed)
Pt comes via EMS after [facility] "kicked the patient out" to home.  Pt reportedly has sacral wounds.  Pt was due to have a air mattress at home but it was not there when they arrived. DNR at bedside. Was recently seen here for the wounds and placement. EMS told facility they would come back when the bed arrived to patient's house but facility refused and told them "she had to go now".

## 2017-02-04 NOTE — ED Notes (Signed)
dsg changed by [facility] wound care nurse.  Pt turned on her L side.  Heels floated off the bed.  Daughter remains at bedside.

## 2017-02-04 NOTE — ED Notes (Signed)
[facility] arrived for transport to home.

## 2017-02-04 NOTE — Consult Note (Addendum)
[facility] Nurse wound consult note Requested to assess sacrum wound.  Pt is familiar to [facility] team, refer to progress note on 8/26.  She was seen by plastics team on 8/27, and surgical team performed debridement on 8/28.  She has been staying at a nursing home since discharge. Reason for Consult: Sacrum with chronic stage 4 wound; refer to photos in the chart.  Wound has greatly improved since surgical debridement was performed.  Daughter at the bedside assessed the wound appearance and discussed plan of care. Pressure Injury  8X6X1cm with undermining to wound edges 2 cm.  90% beefy red, 10% exposed bone with slough attached,  No odor, small amt tan drainage.  Bilat heels darker-colored with intact skin; difficult to assess if these are resolving deep tissue injuries or scars from previous pressure injuries which have healed.  Each heel darker area is approx 2X2cm. POA: Yes Periwound: Areas of medical adhesive related skin damage from tape stripping surrounding the wound; darker colored skin and patchy areas of dark red. Dressing procedure/placement/frequency: Moist gauze packing applied and covered with abd pad.  Pt needs an air mattress if she is admitted to the hospital and daughter is attempting to arrange for one to be delivered at home if she is discharged. Discussed need for increased protein intake to promote wound healing. Daughter denies further questions at this time. Please re-consult if further assistance is needed.  Thank-you,  Julien Girt MSN, [place], [place], [place], [place]

## 2017-02-04 NOTE — ED Notes (Signed)
Pt is resting-waiting for [facility] for transport.  Daughter remains at bedside.

## 2017-02-04 NOTE — Discharge Planning (Signed)
[facility] consulted regarding delivery of DME air mattress.  EDCM contatced Wells Guiles, liaison from [facility], who states the air mattress was ordered though [facility].  Wells Guiles states that she informed the pt and family that the air mattress has to be approved by [facility] before it would be delivered, which could take DAYS.  Wells Guiles offered for pt to remain at [facility] until approval of air mattress was complete, but family was adamant about taking pt to hospital and waiting for DME here.    EDCM discussed with EDSW and EDP.

## 2017-02-04 NOTE — Discharge Instructions (Signed)
For today, it is okay for Kathleen [name] to use the mattress available. Try to use soft pillows rolled under her right and left sides to prevent direct pressure on the area, as on the sheet provided. Change positions every hour if possible. Your home health nurse should be able to help with this.   You should try to eat/drink as much protein as possible. Try BOOST or other high protein, nutrient rich shakes.

## 2017-02-04 NOTE — ED Notes (Signed)
[facility] contacted by [facility] for transport to home.

## 2017-02-04 NOTE — ED Notes (Signed)
Bed: WA03 Expected date:  Expected time:  Means of arrival:  Comments: EMS-SW issue

## 2017-02-04 NOTE — ED Provider Notes (Signed)
[facility] DEPT Provider Note   CSN: [id number] Arrival date & time: 02/04/17  1128     History   Chief Complaint Chief Complaint  Patient presents with  . Sacral Wounds  . Social Work Consult    HPI [employer] is a 83 y.o. female.  HPI  83 year old female here for evaluation after recent discharge from SNF. The patient's had an extensive recent hospitalization for decubitus ulcer. She reportedly was cleared for home health and lost Medicare coverage for her stay. She was arranged with 24-hour care, PT OT, and a air bed. On arrival to the house, however, the air bed reportedly was not in place. The called the facility, who ensured that that it will be available within several days. They offered return at private pay rate but family became very aggressive and agitated. They decided that the patient would rather stay in the emergency department until that arrives. Of note, the patient does have a fully working hospital bed with normal mattress in place at the house with 24-hour home health. She is otherwise well. They state that the wound appears to be improving. No fevers or chills. She is without complaints.  Past Medical History:  Diagnosis Date  . A-fib ([place])   . Alzheimer's dementia   . Cancer colon     Patient Active Problem List   Diagnosis Date Noted  . Severe malnutrition ([place]) 01/29/2017  . Anemia of chronic disease 01/29/2017  . Psychosis 01/29/2017  . Vitamin B 12 deficiency 01/29/2017  . Decubitus ulcer of coccyx, stage IV ([place])   . Encounter for palliative care   . Goals of care, counseling/discussion   . Sepsis ([place]) 01/19/2017  . Hypokalemia 01/19/2017  . Hypoalbuminemia 01/19/2017  . Acute encephalopathy 06/12/2013  . Atrial fibrillation ([place]) 06/12/2013  . Dementia of Alzheimer's type with behavioral disturbance 06/12/2013  . History of colon cancer 06/12/2012    Past Surgical History:  Procedure Laterality Date  . APPENDECTOMY    .  CHOLECYSTECTOMY    . COLON SURGERY    . TONSILLECTOMY      OB History    No data available       Home Medications    Prior to Admission medications   Medication Sig Start Date End Date Taking? Authorizing Provider  acetaminophen (TYLENOL) 325 MG tablet Take 2 tablets (650 mg total) by mouth every 6 (six) hours as needed for mild pain (or Fever >/= 101). 01/28/17   Theodis Blaze, MD  Amino Acids-Protein Hydrolys (FEEDING SUPPLEMENT, PRO-STAT SUGAR FREE 64,) LIQD Take 30 mLs by mouth 2 (two) times daily.    [provider]  aspirin EC 81 MG tablet Take 81 mg by mouth daily as needed for mild pain (hemorrhoids).    [provider]  bisacodyl (DULCOLAX) 10 MG suppository Place 1 suppository (10 mg total) rectally daily as needed for moderate constipation. 01/28/17   Theodis Blaze, MD  cholestyramine Lucrezia Starch) 4 g packet Give 1 packet by mouth 2 hoursbefore meals for wound healing    [provider]  Cyanocobalamin (VITAMIN B-12 CR PO) Take 1,000 mcg by mouth daily.     [provider]  donepezil (ARICEPT) 10 MG tablet Take 10 mg by mouth at bedtime.    [provider]  ferrous sulfate 325 (65 FE) MG tablet Take 1 tablet (325 mg total) by mouth daily with breakfast. 01/29/17   Theodis Blaze, MD  memantine (NAMENDA) 10 MG tablet Take 10 mg  by mouth 2 (two) times daily.    [provider]  Multiple Vitamins-Minerals (DECUBI-VITE) CAPS Take 1 capsule by mouth daily.    [provider]  Nutritional Supplements (NUTRITIONAL SUPPLEMENT PO) House 2.0 - [employer] - give 120cc three times a day for supplement    [provider]  Nutritional Supplements (NUTRITIONAL SUPPLEMENT PO) HSG Regular diet -  HSG Dysphagia Ground texture, regular consistency    [provider]  polyethylene glycol (MIRALAX / GLYCOLAX) packet Take 17 g by mouth daily as needed for mild constipation. 01/28/17   Theodis Blaze, MD  QUEtiapine (SEROQUEL) 25  MG tablet Take 25 mg by mouth at bedtime.     [provider]  sulfamethoxazole-trimethoprim (BACTRIM DS,SEPTRA DS) 800-160 MG tablet Take 1 tablet by mouth every 12 (twelve) hours. 01/28/17   Theodis Blaze, MD  Vitamin D, Ergocalciferol, (DRISDOL) 50000 UNITS CAPS capsule Take 50,000 Units by mouth every 7 (seven) days. On Wednesday    [provider]    Family History Family History  Problem Relation Age of Onset  . Family history unknown: Yes    Social History Social History  Substance Use Topics  . Smoking status: Never Smoker  . Smokeless tobacco: Never Used  . Alcohol use No     Allergies   Penicillins   Review of Systems Review of Systems  Constitutional: Negative for chills and fever.  HENT: Negative for congestion, rhinorrhea and sore throat.   Eyes: Negative for visual disturbance.  Respiratory: Negative for cough, shortness of breath and wheezing.   Cardiovascular: Negative for chest pain and leg swelling.  Gastrointestinal: Negative for abdominal pain, diarrhea, nausea and vomiting.  Genitourinary: Negative for dysuria, flank pain, vaginal bleeding and vaginal discharge.  Musculoskeletal: Positive for back pain. Negative for neck pain.  Skin: Positive for wound. Negative for rash.  Allergic/Immunologic: Negative for immunocompromised state.  Neurological: Positive for weakness (generalized). Negative for syncope and headaches.  Hematological: Does not bruise/bleed easily.  All other systems reviewed and are negative.    Physical Exam Updated Vital Signs BP 118/61 (BP Location: Right Arm)   Pulse 61   Temp 98.2 F (36.8 C) (Oral)   Resp 18   SpO2 93%   Physical Exam  Constitutional: She appears well-developed and well-nourished. No distress.  HENT:  Head: Normocephalic and atraumatic.  Eyes: Conjunctivae are normal.  Neck: Neck supple.  Cardiovascular: Normal rate, regular rhythm and normal heart sounds.  Exam reveals no friction  rub.   No murmur heard. Pulmonary/Chest: Effort normal and breath sounds normal. No respiratory distress. She has no wheezes. She has no rales.  Abdominal: Soft. She exhibits no distension. There is no tenderness. There is no guarding.  Musculoskeletal: She exhibits no edema.  Stage IV decubitus ulcer to sacrum, with exposed muscle body. There is fibrinous exudate in the wound base but no gross purulence. No surrounding erythema/induration.  Neurological: She is alert. She exhibits normal muscle tone.  Skin: Skin is warm. Capillary refill takes less than 2 seconds.  Psychiatric: She has a normal mood and affect.  Nursing note and vitals reviewed.    ED Treatments / Results  Labs (all labs ordered are listed, but only abnormal results are displayed) Labs Reviewed  BASIC METABOLIC PANEL - Abnormal; Notable for the following:       Result Value   Sodium 133 (*)    Chloride 98 (*)    All other components within normal limits  CBC WITH  DIFFERENTIAL/PLATELET - Abnormal; Notable for the following:    Hemoglobin 11.3 (*)    HCT 35.5 (*)    RDW 17.0 (*)    Platelets 556 (*)    All other components within normal limits    EKG  EKG Interpretation None       Radiology No results found.  Procedures Procedures (including critical care time)  Medications Ordered in ED Medications - No data to display   Initial Impression / Assessment and Plan / ED Course  I have reviewed the triage vital signs and the nursing notes.  Pertinent labs & imaging results that were available during my care of the patient were reviewed by me and considered in my medical decision making (see chart for details).     83 year old female with chronic decubitus ulcers here after recent discharge from skilled nursing facility. Today's visit is mostly due to miscommunication between patient's family and skilled nursing facility. Regarding her wound, it appears to be clean. She has a normal white count and I  do not suspect acute superinfection. I had wound care nursing come by and evaluate the wound, who agrees that it appears improved from prior imaging, and recommends continued care at home. Regarding her care bed, I have had social work as well as case management look into this. The bed will be available in the next 48 hours. The patient does have 24-hour home health and can be regularly rotated and moved to bed. I do not feel that this necessitates inpatient admission at this time, and wound care is in agreement. I discussed this at length with the family, who initially expressed significant frustration with the facility. I have placed the patient's family in contact with the case manager at the facility. Given pt's otherwise well appearance and clearance by PT/OT just last week for home health/management, do not feel admission indicated at this time.  Final Clinical Impressions(s) / ED Diagnoses   Final diagnoses:  Sacral decubitus ulcer, stage IV [facility])    New Prescriptions Discharge Medication List as of 02/04/2017  5:01 PM       Duffy Bruce, MD 02/05/17 [phone]

## 2017-02-04 NOTE — ED Notes (Signed)
Daughter made aware that transport was contacted.  She was on the phone with [facility]'s clinician liaison Sharene Butters.

## 2017-02-04 NOTE — Progress Notes (Addendum)
CSW received call from Wells Guiles with [facility] stating patient was a resident at their facility and insurance paid for all needed days. Patient and family had the option to pay privately for any additional day however requested to go home. While at facility patient needed a specific air mattress( facility orders air mattress) but once patient returns home this air mattress has to be ordered through home health/ processed through insurance. Patient has [employer] and has [facility]. CSW requested EDP to consult CM to help with air mattress and home health needs.   CSW informed by CM that patients insurance could take multiple days to provide needed mattress at home. Patient does receive 24/7 care while at home.   Plan: If medically stable, patient is able to [facility] [phone] private room and $[phone] semi private room) or will have to discharge home and wait for mattress to be provided.   Kingsley Spittle, [facility] Emergency Room Clinical Social Worker [phone]

## 2017-02-10 DIAGNOSIS — T148XXA Other injury of unspecified body region, initial encounter: Secondary | ICD-10-CM | POA: Insufficient documentation

## 2017-02-10 NOTE — Progress Notes (Signed)
This encounter was created in error - please disregard.

## 2017-03-11 ENCOUNTER — Inpatient Hospital Stay (HOSPITAL_COMMUNITY)
Admission: EM | Admit: 2017-03-11 | Discharge: 2017-03-16 | DRG: 871 | Disposition: A | Discharge status: Hospice | Payer: Medicare Other | Attending: Internal Medicine | Admitting: Internal Medicine

## 2017-03-11 ENCOUNTER — Encounter (HOSPITAL_COMMUNITY): Payer: Self-pay | Admitting: Family Medicine

## 2017-03-11 ENCOUNTER — Emergency Department (HOSPITAL_COMMUNITY): Payer: Medicare Other

## 2017-03-11 DIAGNOSIS — Z66 Do not resuscitate: Secondary | ICD-10-CM | POA: Diagnosis present

## 2017-03-11 DIAGNOSIS — Z681 Body mass index (BMI) 19 or less, adult: Secondary | ICD-10-CM

## 2017-03-11 DIAGNOSIS — R945 Abnormal results of liver function studies: Secondary | ICD-10-CM | POA: Diagnosis present

## 2017-03-11 DIAGNOSIS — E870 Hyperosmolality and hypernatremia: Secondary | ICD-10-CM

## 2017-03-11 DIAGNOSIS — I4891 Unspecified atrial fibrillation: Secondary | ICD-10-CM | POA: Diagnosis present

## 2017-03-11 DIAGNOSIS — N179 Acute kidney failure, unspecified: Secondary | ICD-10-CM | POA: Diagnosis present

## 2017-03-11 DIAGNOSIS — N189 Chronic kidney disease, unspecified: Secondary | ICD-10-CM | POA: Diagnosis present

## 2017-03-11 DIAGNOSIS — G9341 Metabolic encephalopathy: Secondary | ICD-10-CM | POA: Diagnosis present

## 2017-03-11 DIAGNOSIS — J449 Chronic obstructive pulmonary disease, unspecified: Secondary | ICD-10-CM | POA: Diagnosis present

## 2017-03-11 DIAGNOSIS — E43 Unspecified severe protein-calorie malnutrition: Secondary | ICD-10-CM | POA: Diagnosis present

## 2017-03-11 DIAGNOSIS — R569 Unspecified convulsions: Secondary | ICD-10-CM

## 2017-03-11 DIAGNOSIS — L89154 Pressure ulcer of sacral region, stage 4: Secondary | ICD-10-CM | POA: Diagnosis present

## 2017-03-11 DIAGNOSIS — I482 Chronic atrial fibrillation: Secondary | ICD-10-CM | POA: Diagnosis present

## 2017-03-11 DIAGNOSIS — R7989 Other specified abnormal findings of blood chemistry: Secondary | ICD-10-CM

## 2017-03-11 DIAGNOSIS — Z7189 Other specified counseling: Secondary | ICD-10-CM

## 2017-03-11 DIAGNOSIS — R740 Nonspecific elevation of levels of transaminase and lactic acid dehydrogenase [LDH]: Secondary | ICD-10-CM | POA: Diagnosis present

## 2017-03-11 DIAGNOSIS — D638 Anemia in other chronic diseases classified elsewhere: Secondary | ICD-10-CM | POA: Diagnosis present

## 2017-03-11 DIAGNOSIS — Z85038 Personal history of other malignant neoplasm of large intestine: Secondary | ICD-10-CM

## 2017-03-11 DIAGNOSIS — I639 Cerebral infarction, unspecified: Secondary | ICD-10-CM

## 2017-03-11 DIAGNOSIS — E860 Dehydration: Secondary | ICD-10-CM

## 2017-03-11 DIAGNOSIS — A419 Sepsis, unspecified organism: Secondary | ICD-10-CM | POA: Diagnosis present

## 2017-03-11 DIAGNOSIS — I21A1 Myocardial infarction type 2: Secondary | ICD-10-CM | POA: Diagnosis present

## 2017-03-11 DIAGNOSIS — F0281 Dementia in other diseases classified elsewhere with behavioral disturbance: Secondary | ICD-10-CM | POA: Diagnosis present

## 2017-03-11 DIAGNOSIS — Z79899 Other long term (current) drug therapy: Secondary | ICD-10-CM

## 2017-03-11 DIAGNOSIS — G309 Alzheimer's disease, unspecified: Secondary | ICD-10-CM | POA: Diagnosis present

## 2017-03-11 DIAGNOSIS — R748 Abnormal levels of other serum enzymes: Secondary | ICD-10-CM | POA: Diagnosis not present

## 2017-03-11 DIAGNOSIS — D649 Anemia, unspecified: Secondary | ICD-10-CM | POA: Diagnosis not present

## 2017-03-11 DIAGNOSIS — Z515 Encounter for palliative care: Secondary | ICD-10-CM | POA: Diagnosis present

## 2017-03-11 DIAGNOSIS — N19 Unspecified kidney failure: Secondary | ICD-10-CM

## 2017-03-11 DIAGNOSIS — J984 Other disorders of lung: Secondary | ICD-10-CM | POA: Diagnosis present

## 2017-03-11 DIAGNOSIS — R4182 Altered mental status, unspecified: Secondary | ICD-10-CM | POA: Diagnosis present

## 2017-03-11 DIAGNOSIS — G9340 Encephalopathy, unspecified: Secondary | ICD-10-CM | POA: Diagnosis not present

## 2017-03-11 DIAGNOSIS — R778 Other specified abnormalities of plasma proteins: Secondary | ICD-10-CM | POA: Diagnosis present

## 2017-03-11 DIAGNOSIS — D696 Thrombocytopenia, unspecified: Secondary | ICD-10-CM

## 2017-03-11 DIAGNOSIS — Z880 Allergy status to penicillin: Secondary | ICD-10-CM

## 2017-03-11 LAB — CBC WITH DIFFERENTIAL/PLATELET
BASOS ABS: 0 10*3/uL (ref 0.0–0.1)
Basophils Relative: 0 %
EOS ABS: 0 10*3/uL (ref 0.0–0.7)
Eosinophils Relative: 0 %
HCT: 34.3 % — ABNORMAL LOW (ref 36.0–46.0)
Hemoglobin: 9.8 g/dL — ABNORMAL LOW (ref 12.0–15.0)
Lymphocytes Relative: 6 %
Lymphs Abs: 1 10*3/uL (ref 0.7–4.0)
MCH: 27.1 pg (ref 26.0–34.0)
MCHC: 28.6 g/dL — ABNORMAL LOW (ref 30.0–36.0)
MCV: 94.8 fL (ref 78.0–100.0)
MONO ABS: 0.7 10*3/uL (ref 0.1–1.0)
Monocytes Relative: 4 %
NEUTROS PCT: 90 %
Neutro Abs: 14.9 10*3/uL — ABNORMAL HIGH (ref 1.7–7.7)
PLATELETS: 134 10*3/uL — AB (ref 150–400)
RBC: 3.62 MIL/uL — AB (ref 3.87–5.11)
RDW: 20.3 % — ABNORMAL HIGH (ref 11.5–15.5)
WBC: 16.6 10*3/uL — AB (ref 4.0–10.5)

## 2017-03-11 LAB — BASIC METABOLIC PANEL
BUN: 99 mg/dL — AB (ref 6–20)
CALCIUM: 10.4 mg/dL — AB (ref 8.9–10.3)
CO2: 25 mmol/L (ref 22–32)
Chloride: >130 mmol/L (ref 101–111)
Creatinine, Ser: 2.79 mg/dL — ABNORMAL HIGH (ref 0.44–1.00)
GFR calc Af Amer: 16 mL/min — ABNORMAL LOW (ref >60)
GFR, EST NON AFRICAN AMERICAN: 14 mL/min — AB (ref >60)
Glucose, Bld: 134 mg/dL — ABNORMAL HIGH (ref 65–99)
POTASSIUM: 3.4 mmol/L — AB (ref 3.5–5.1)
SODIUM: 172 mmol/L — AB (ref 135–145)

## 2017-03-11 LAB — I-STAT TROPONIN, ED: TROPONIN I, POC: 0.13 ng/mL — AB (ref 0.00–0.08)

## 2017-03-11 LAB — COMPREHENSIVE METABOLIC PANEL
ALT: 92 U/L — ABNORMAL HIGH (ref 14–54)
AST: 61 U/L — ABNORMAL HIGH (ref 15–41)
Albumin: 1.7 g/dL — ABNORMAL LOW (ref 3.5–5.0)
Alkaline Phosphatase: 126 U/L (ref 38–126)
BUN: 112 mg/dL — ABNORMAL HIGH (ref 6–20)
CO2: 25 mmol/L (ref 22–32)
CREATININE: 2.9 mg/dL — AB (ref 0.44–1.00)
Calcium: 10.6 mg/dL — ABNORMAL HIGH (ref 8.9–10.3)
GFR calc Af Amer: 16 mL/min — ABNORMAL LOW (ref >60)
GFR, EST NON AFRICAN AMERICAN: 13 mL/min — AB (ref >60)
Glucose, Bld: 164 mg/dL — ABNORMAL HIGH (ref 65–99)
POTASSIUM: 3.6 mmol/L (ref 3.5–5.1)
Sodium: 175 mmol/L (ref 135–145)
TOTAL PROTEIN: 6.6 g/dL (ref 6.5–8.1)
Total Bilirubin: 1.9 mg/dL — ABNORMAL HIGH (ref 0.3–1.2)

## 2017-03-11 LAB — I-STAT CG4 LACTIC ACID, ED: LACTIC ACID, VENOUS: 2.2 mmol/L — AB (ref 0.5–1.9)

## 2017-03-11 LAB — AMMONIA: Ammonia: 34 umol/L (ref 9–35)

## 2017-03-11 LAB — PROTIME-INR
INR: 1.65
Prothrombin Time: 19.4 seconds — ABNORMAL HIGH (ref 11.4–15.2)

## 2017-03-11 LAB — LACTIC ACID, PLASMA
LACTIC ACID, VENOUS: 1.4 mmol/L (ref 0.5–1.9)
LACTIC ACID, VENOUS: 1.5 mmol/L (ref 0.5–1.9)

## 2017-03-11 LAB — TROPONIN I: Troponin I: 0.15 ng/mL (ref <0.03)

## 2017-03-11 LAB — CBG MONITORING, ED: Glucose-Capillary: 121 mg/dL — ABNORMAL HIGH (ref 65–99)

## 2017-03-11 LAB — PROCALCITONIN: PROCALCITONIN: 1.55 ng/mL

## 2017-03-11 MED ORDER — LORAZEPAM 2 MG/ML IJ SOLN: 1.0000 mg | INTRAMUSCULAR | Status: DC | PRN

## 2017-03-11 MED ORDER — ONDANSETRON HCL 4 MG/2ML IJ SOLN: 4.0000 mg | Freq: Four times a day (QID) | INTRAMUSCULAR | Status: DC | PRN

## 2017-03-11 MED ORDER — SODIUM CHLORIDE 0.9 % IV SOLN
INTRAVENOUS | Status: DC
  Administered 2017-03-11: 23:00:00 via INTRAVENOUS

## 2017-03-11 MED ORDER — SODIUM CHLORIDE 0.9 % IV BOLUS (SEPSIS)
1000.0000 mL | Freq: Once | INTRAVENOUS | Status: AC
  Administered 2017-03-11: 1000 mL via INTRAVENOUS

## 2017-03-11 MED ORDER — VANCOMYCIN HCL IN DEXTROSE 1-5 GM/200ML-% IV SOLN
1000.0000 mg | Freq: Once | INTRAVENOUS | Status: AC
  Administered 2017-03-11: 1000 mg via INTRAVENOUS
  Filled 2017-03-11: qty 200

## 2017-03-11 MED ORDER — DEXTROSE 5 % IV SOLN
2.0000 g | Freq: Once | INTRAVENOUS | Status: AC
  Administered 2017-03-11: 2 g via INTRAVENOUS
  Filled 2017-03-11: qty 2

## 2017-03-11 MED ORDER — SODIUM CHLORIDE 0.9 % IV BOLUS (SEPSIS)
250.0000 mL | Freq: Once | INTRAVENOUS | Status: AC
  Administered 2017-03-11: 999.9 mL via INTRAVENOUS

## 2017-03-11 MED ORDER — SODIUM CHLORIDE 0.9 % IV SOLN
500.0000 mg | Freq: Two times a day (BID) | INTRAVENOUS | Status: DC
  Filled 2017-03-11: qty 5

## 2017-03-11 MED ORDER — HYDRALAZINE HCL 20 MG/ML IJ SOLN: 5.0000 mg | INTRAMUSCULAR | Status: DC | PRN

## 2017-03-11 MED ORDER — SODIUM CHLORIDE 0.9 % IV SOLN
500.0000 mg | Freq: Once | INTRAVENOUS | Status: AC
  Administered 2017-03-11: 500 mg via INTRAVENOUS
  Filled 2017-03-11: qty 5

## 2017-03-11 MED ORDER — ASPIRIN 300 MG RE SUPP
300.0000 mg | Freq: Every day | RECTAL | Status: DC
  Administered 2017-03-12 – 2017-03-14 (×3): 300 mg via RECTAL
  Filled 2017-03-11 (×3): qty 1

## 2017-03-11 MED ORDER — HYDROXYZINE HCL 50 MG/ML IM SOLN
25.0000 mg | Freq: Four times a day (QID) | INTRAMUSCULAR | Status: DC | PRN
  Filled 2017-03-11: qty 0.5

## 2017-03-11 MED ORDER — SODIUM CHLORIDE 0.9 % IV SOLN
1.0000 g | INTRAVENOUS | Status: DC
  Administered 2017-03-11: 1 g via INTRAVENOUS
  Filled 2017-03-11: qty 1

## 2017-03-11 MED ORDER — DEXTROSE 5 % IV SOLN
250.0000 mg | Freq: Every day | INTRAVENOUS | Status: DC
  Administered 2017-03-11: 250 mg via INTRAVENOUS
  Filled 2017-03-11: qty 5

## 2017-03-11 MED ORDER — DEXAMETHASONE SODIUM PHOSPHATE 10 MG/ML IJ SOLN
10.0000 mg | Freq: Once | INTRAMUSCULAR | Status: AC
  Administered 2017-03-11: 10 mg via INTRAVENOUS
  Filled 2017-03-11: qty 1

## 2017-03-11 MED ORDER — DEXTROSE 5 % IV SOLN: 500.0000 mg | INTRAVENOUS | Status: DC

## 2017-03-11 MED ORDER — ENOXAPARIN SODIUM 40 MG/0.4ML ~~LOC~~ SOLN: 40.0000 mg | SUBCUTANEOUS | Status: DC

## 2017-03-11 MED ORDER — SODIUM CHLORIDE 0.9 % IV BOLUS (SEPSIS)
500.0000 mL | Freq: Once | INTRAVENOUS | Status: AC
  Administered 2017-03-11: 999.9 mL via INTRAVENOUS

## 2017-03-11 MED ORDER — ONDANSETRON HCL 4 MG PO TABS: 4.0000 mg | ORAL_TABLET | Freq: Four times a day (QID) | ORAL | Status: DC | PRN

## 2017-03-11 NOTE — H&P (Addendum)
History and Physical    Kathleen [name] ZDG:[id number] DOB: [birth year] DOA: 03/11/2017  Referring MD/NP/PA:   PCP: Jani Gravel, MD   Patient coming from:  The patient is coming from home.  At baseline, pt is partially dependent for most of ADL.     Chief Complaint: AMS and right hand shaking  HPI: Kathleen [name] is a 83 y.o. female with medical history significant of Atrial fibrillation not on anticoagulants, dementia, iron deficiency anemia, psychosis, colon cancer, who presents with altered mental status.  Per her daughter, patient has been confused in the past several days, which has been progressively getting worse. She stopped talking to family members. She moves all extremities, and no facial droop or slurred speech. She also had has decreased oral intake.  Pt was noted to have right hand shaking sometimes. Patient does not have active cough, nausea, vomiting or diarrhea. Per her daughter, patient does not have chest pain or shortness of breath. Not sure if patient has any symptoms of UTI.  ED Course: pt was found to have WBC 16.6, lactic acid 2.2, ammonia level 34, abnormal liver functions with AST 61, ALT 92, total bilirubin 1.9 and ALP 126, 1.4, troponin 0.13, acute renal injury with creatinine 2.90, sodium 175, calcium 10.6, temperature normal, tachycardia, tachypnea, oxygen saturation 100% on room air. Chest x-ray has no infiltration, but showed possible COPD. CT head is negative for acute intracranial abnormalities. Patient is admitted to stepdown bed as inpatient. Neurology was consulted.  Review of Systems: could not be reviewed due to altered mental status.   Allergy:  Allergies  Allergen Reactions  . Penicillins Other (See Comments)    Pt family and pt does not know the reaction. Per records, she has tolerated Rocephin.    Past Medical History:  Diagnosis Date  . A-fib ([place])   . Alzheimer's dementia   . Cancer colon     Past Surgical History:  Procedure Laterality  Date  . APPENDECTOMY    . CHOLECYSTECTOMY    . COLON SURGERY    . TONSILLECTOMY      Social History:  reports that she has never smoked. She has never used smokeless tobacco. She reports that she does not drink alcohol or use drugs.  Family History:  Family History  Problem Relation Age of Onset  . Family history unknown: Yes     Prior to Admission medications   Medication Sig Start Date End Date Taking? Authorizing Provider  acetaminophen (TYLENOL) 325 MG tablet Take 2 tablets (650 mg total) by mouth every 6 (six) hours as needed for mild pain (or Fever >/= 101). 01/28/17  Yes Theodis Blaze, MD  cholestyramine Lucrezia Starch) 4 g packet Give 1 packet by mouth 2 hoursbefore meals for wound healing   Yes [provider]  Cyanocobalamin (VITAMIN B-12 CR PO) Take 1,000 mcg by mouth once a week.    Yes [provider]  donepezil (ARICEPT) 10 MG tablet Take 10 mg by mouth at bedtime.   Yes [provider]  Iron-Vitamins (GERITOL PO) Take 15 mLs by mouth daily.   Yes [provider]  memantine (NAMENDA) 10 MG tablet Take 10 mg by mouth 2 (two) times daily.   Yes [provider]  Multiple Vitamins-Minerals (DECUBI-VITE) CAPS Take 1 capsule by mouth daily.   Yes [provider]  Nutritional Supplements (NUTRITIONAL SUPPLEMENT PO) House 2.0 - [employer] - give 120cc three times a day for supplement   Yes [provider]  Amino Acids-Protein Hydrolys (FEEDING SUPPLEMENT, PRO-STAT SUGAR FREE 64,) LIQD Take 30 mLs by mouth 2 (two) times daily.    [provider]  aspirin EC 81 MG tablet Take 81 mg by mouth daily as needed for mild pain (hemorrhoids).    [provider]  bisacodyl (DULCOLAX) 10 MG suppository Place 1 suppository (10 mg total) rectally daily as needed for moderate constipation. 01/28/17   Theodis Blaze, MD  ferrous sulfate 325 (65 FE) MG tablet Take 1 tablet (325 mg total) by mouth daily with breakfast. Patient not  taking: Reported on 03/11/2017 01/29/17   Theodis Blaze, MD  Nutritional Supplements (NUTRITIONAL SUPPLEMENT PO) HSG Regular diet -  HSG Dysphagia Ground texture, regular consistency    [provider]  polyethylene glycol (MIRALAX / GLYCOLAX) packet Take 17 g by mouth daily as needed for mild constipation. 01/28/17   Theodis Blaze, MD  sulfamethoxazole-trimethoprim (BACTRIM DS,SEPTRA DS) 800-160 MG tablet Take 1 tablet by mouth every 12 (twelve) hours. Patient not taking: Reported on 03/11/2017 01/28/17   Theodis Blaze, MD  Vitamin D, Ergocalciferol, (DRISDOL) 50000 UNITS CAPS capsule Take 50,000 Units by mouth every 7 (seven) days. On Wednesday    [provider]    Physical Exam: Vitals:   03/11/17 1850 03/11/17 1900 03/11/17 1930 03/11/17 2135  BP:  (!) 109/52 (!) 102/50 (!) 84/50  Pulse:   79 79  Resp:  (!) 25 (!) 27 (!) 22  Temp:      TempSrc:      SpO2:   100% 98%  Weight: 47.6 kg (105 lb)     Height: 5\' 6"  (1.676 m)      General: Not in acute distress. Dry mucus and membrane. Cachectic. HEENT:       Eyes: PERRL, EOMI, no scleral icterus.       ENT: No discharge from the ears and nose, no pharynx injection, no tonsillar enlargement.        Neck: No JVD, no bruit, no mass felt. Heme: No neck lymph node enlargement. Cardiac: S1/S2, RRR, No murmurs, No gallops or rubs. Respiratory: No rales, wheezing, rhonchi or rubs. GI: Soft, nondistended, nontender, no rebound pain, no organomegaly, BS present. GU: No hematuria Ext: No pitting leg edema bilaterally. 2+DP/PT pulse bilaterally. Musculoskeletal: No joint deformities, No joint redness or warmth, no limitation of ROM in spin. Skin: has stage IV sacral ulcer Neuro: AMS, not oriented X3, cranial nerves II-XII grossly intact, moves all extremities. Has neck rigidity. Psych: Patient is not psychotic.  Labs on Admission: I have personally reviewed following labs and imaging studies  CBC:  Recent Labs Lab  03/11/17 1804  WBC 16.6*  NEUTROABS 14.9*  HGB 9.8*  HCT 34.3*  MCV 94.8  PLT 299*   Basic Metabolic Panel:  Recent Labs Lab 03/11/17 1804  NA 175*  K 3.6  CL >130*  CO2 25  GLUCOSE 164*  BUN 112*  CREATININE 2.90*  CALCIUM 10.6*   GFR: Estimated Creatinine Clearance: 9.9 mL/min (A) (by C-G formula based on SCr of 2.9 mg/dL (H)). Liver Function Tests:  Recent Labs Lab 03/11/17 1804  AST 61*  ALT 92*  ALKPHOS 126  BILITOT 1.9*  PROT 6.6  ALBUMIN 1.7*   No results for input(s): LIPASE, AMYLASE in the last 168 hours.  Recent Labs Lab 03/11/17 1804  AMMONIA 34   Coagulation Profile: No results for input(s): INR, PROTIME in the last 168 hours. Cardiac Enzymes: No results for input(s): CKTOTAL,  CKMB, CKMBINDEX, TROPONINI in the last 168 hours. BNP (last 3 results) No results for input(s): PROBNP in the last 8760 hours. HbA1C: No results for input(s): HGBA1C in the last 72 hours. CBG:  Recent Labs Lab 03/11/17 1450  GLUCAP 121*   Lipid Profile: No results for input(s): CHOL, HDL, LDLCALC, TRIG, CHOLHDL, LDLDIRECT in the last 72 hours. Thyroid Function Tests: No results for input(s): TSH, T4TOTAL, FREET4, T3FREE, THYROIDAB in the last 72 hours. Anemia Panel: No results for input(s): VITAMINB12, FOLATE, FERRITIN, TIBC, IRON, RETICCTPCT in the last 72 hours. Urine analysis:    Component Value Date/Time   COLORURINE YELLOW 01/19/2017 2000   APPEARANCEUR CLEAR 01/19/2017 2000   LABSPEC 1.020 01/19/2017 2000   PHURINE 6.0 01/19/2017 2000   GLUCOSEU NEGATIVE 01/19/2017 2000   HGBUR NEGATIVE 01/19/2017 2000   BILIRUBINUR NEGATIVE 01/19/2017 2000   KETONESUR NEGATIVE 01/19/2017 2000   PROTEINUR 30 (A) 01/19/2017 2000   UROBILINOGEN 0.2 06/12/2013 0632   NITRITE NEGATIVE 01/19/2017 2000   LEUKOCYTESUR NEGATIVE 01/19/2017 2000   Sepsis Labs: @LABRCNTIP (procalcitonin:4,lacticidven:4) ) Recent Results (from the past 240 hour(s))  Blood Culture (routine  x 2)     Status: None (Preliminary result)   Collection Time: 03/11/17  6:04 PM  Result Value Ref Range Status   Specimen Description BLOOD RIGHT ANTECUBITAL  Final   Special Requests   Final    BOTTLES DRAWN AEROBIC AND ANAEROBIC Blood Culture adequate volume Performed at [facility], [place] [address] [place], [place] [address]    Culture PENDING  Incomplete   Report Status PENDING  Incomplete     Radiological Exams on Admission: Dg Chest 1 View  Result Date: 03/11/2017 CLINICAL DATA:  Shortness of breath EXAM: CHEST 1 VIEW COMPARISON:  Chest radiograph 01/19/2017 FINDINGS: The lungs are hyperinflated without focal consolidation or pulmonary edema. No pleural effusion or pneumothorax. Normal cardiomediastinal contours. Right shoulder osteoarthrosis. IMPRESSION: Hyperinflated lungs may indicate underlying COPD. No focal airspace disease. Electronically Signed   By: Ulyses Jarred M.D.   On: 03/11/2017 15:13   Ct Head Wo Contrast  Result Date: 03/11/2017 CLINICAL DATA:  Altered mental status. EXAM: CT HEAD WITHOUT CONTRAST TECHNIQUE: Contiguous axial images were obtained from the base of the skull through the vertex without intravenous contrast. COMPARISON:  CT head dated June 22, 2015. FINDINGS: Brain: No evidence of acute infarction, hemorrhage, hydrocephalus, extra-axial collection or mass lesion/mass effect. Moderate age-related cerebral atrophy with compensatory dilatation of the ventricles, similar to prior study. Vascular: No hyperdense vessel or unexpected calcification. Skull: Negative for fracture or focal lesion. Sinuses/Orbits: The bilateral paranasal sinuses and mastoid air cells are clear. Bilateral pseudophakia. No acute orbital abnormality. Other: None. IMPRESSION: No acute intracranial abnormality.  Stable cerebral atrophy. Electronically Signed   By: Titus Dubin M.D.   On: 03/11/2017 15:27     EKG: Independently reviewed.  Sinus rhythm, QTC 552, T-wave  flattening, nonspecific ST-T changes.   Assessment/Plan Principal Problem:   Acute metabolic encephalopathy Active Problems:   Atrial fibrillation (HCC)   Dementia of Alzheimer's type with behavioral disturbance   Severe dehydration   Sepsis (HCC)   Decubitus ulcer of coccyx, stage IV (HCC)   Severe malnutrition (HCC)   Anemia of chronic disease   Hypernatremia   Hypercalcemia   Elevated troponin   AKI (acute kidney injury) ([place])   Abnormal LFTs  Addendum: due to pt's bp is running low, pt was given more NS bolus, totally, pt has received 3.25 L of NS.  Her sodium is corrected to fast in the past 4 hours from 172-->169. When pt's Bp is measured in arms, her SBP is 70s-80s; but when measured in legs, her SBP is at 100s. -will decreased NS rate from 100 to 50 cc/h -Will give 100 mg of solucortef and check cortisol level -continue BMP q4h. -consult to palliative care   Acute metabolic encephalopathy: etiology is not clear. Likely multifactorial etiology, including electrolytes disturbance (hypernatremia and hypercalcemia), dehydration, worsening renal function, sepsis with unclear source of infection, and possible focal seizure in right upper arm. CT-head is negative for acute intracranial abnormalities. Neurology was consulted.  -will admit to SDU as inpt -f/u Neuro recommendation -Seizure precaution -When necessary Ativan for seizure -Frequent neuro check -Treat other issues as below -hold all oral meds -will get MRI of brain in AM   Possible focal seizure: with right upper extremities shaking. CT head is negative. May be related to electrolytes disturbance and sepsis. -Follow-up neurologist recommendation -Patient was loaded with Keppra 500 mg by IV-->will continue keppra 500 mg daily per Dr. Cheral Marker -Seizure precaution -when necessary Ativan  -EEG  Sepsis with unclear source of infection: atient admits criteria for sepsis with leukocytosis and tachycardia and tachypnea.  No fever. Lactic acid is 22, 1.4. Source of infection is not clear. Chest x-ray has no infiltration. Pending urinalysis. Neruology, Dr. Mendel Ryder recommended to cover possible meningitis due to neck rigidity.  -IV vancomycin and cefepime were started in ED. -Pt is over 77 year old, will need to cover Listeria. Pt  is allergic to PCN with unknown reaction. Patient cannot use ampicillin. Due to acute renal injury, Bactrim is also not a good choice. I discussed with pharmacist, ho recommended to switch cefepime to meropenem.  -decadrone 10 mg x 1 -Follow-up blood culture and urine culture -will get Procalcitonin and trend lactic acid levels per sepsis protocol. -IVF: 2.75 L of NS bolus in ED-->will get stat BMP to see sodium level to decide further IVF (NS Vs. D5-1/2NS). -will giver prn NS bolus for maintaining blood pressure -will start acyclovir -INR -please call IR for LP in AM.  Hypernatremia and dehydration: Na 175. -IVF: pt received 2.75L of NS bolus in ED-->will get stat BMP to see sodium level to decide further IVF (NS Vs. D5-1/2NS) -check urine and blood Osmo and urine sodium  -q4h BMP per Neurology  Hypercalcemia: Ca 10.6. Likely due to dehydration -Hold vitamin D and calcium supplements -IV fluid as above  Elevated troponin: trop 0.13. No sure if pt has any CP. Possibly due to demand ischemia secondary to multiple acute issues as above including sepsis. - cycle CE q6 x3 and repeat EKG in the am  - ASA per rectal - Risk factor stratification: will check FLP and A1C  - 2d echo  AKI: Likely due to prerenal secondary to dehydration and continuation of bactrium - IVF as above - Check FeNa - Follow up renal function by BMP  Atrial Fibrillation: CHA2DS2-VASc Score is 3, needs oral anticoagulation, but pt is not on AC at home, possibly due to dementia and high risk for fall? Heart rate is 80 to 100s. -tele monitoring.  Dementia of Alzheimer's type with behavioral disturbance: -hold  oral donepezil, Namenda,  Decubitus ulcer of coccyx, stage IV ([place]): -consult to wound care  Severe malnutrition [facility]): -Nutrition consult  Anemia of chronic disease: hemoglobin 11.3 on 02/04/17--> 9.8. Slightly dropped. No active bleeding noted. -Follow-up by CBC -hold oral iron supplement  Abnormal LFTs: AST 61, ALT  92, total bilirubin 1.9 and ALP 126.etiology is not clear. May be due to sepsis. -avoid tylenol. -check hepatitis panel   DVT ppx: SQ Lovenox Code Status: DNR ( pt has yellow paper with DNR.  I confirmed with her daughter that pt is DNR ) Family Communication: None at bed side.    Disposition Plan:  Anticipate discharge back to previous home environment Consults called:  Neurology Admission status:  SDU/inpation       Date of Service 03/11/2017    Ivor Costa [facility] Pager [phone]  If 7PM-7AM, please contact night-coverage [email or web address] Password [facility] 03/11/2017, 9:36 PM

## 2017-03-11 NOTE — ED Triage Notes (Signed)
Patient is from home and transported via [facility]. Per EMS, patient has dementia and normally combative at home. However, she has decrease in oral intake, more altered, and less responsive.

## 2017-03-11 NOTE — ED Notes (Signed)
Pt was given antibiotic prior to blood cultures being drawn. Pt had IV access that would not allow blood return. Pt was stuck for blood cultures multiple times by staff. Decision was made to start antibiotic treatments while waiting for blood cultures to be obtained.

## 2017-03-11 NOTE — ED Notes (Signed)
Informed Dr. Micheline Chapman of patients blood draw status. Have utlrasound machine and catheters at bedside.

## 2017-03-11 NOTE — Progress Notes (Addendum)
Pharmacy Antibiotic Note  Kathleen [name] is a 83 y.o. female admitted on 03/11/2017 with sepsis.  Pharmacy has been consulted for vancomycin and cefepime dosing. First doses given in ED. PCN allergy noted but has tolerated.  cephalosporins in past.   Addendum: [facility] discussed case with neurology, recommend covering for meningitis.  Due to PCN allergy cannot use ampicillin for listeria coverage but has tolerated cephalosporin so will change cefepime to meropenem.  Also add acyclovir, continue vancomycin   Today, 03/11/2017  Renal: AKI  WBC elevated  Plan:  Vancomycin 1gm x 1 given in ED  Based on current renal function, plan for random level 10/17 with am labs  Meropenem 1gm IV q24h  Acyclovir 250mg  (5mg /kg) IV q24h  Monitor renal fx closely  F/u LP results and cultures to help narrow antibiotic  Daily SCr  Height: 5\' 6"  (167.6 cm) Weight: 105 lb (47.6 kg) IBW/kg (Calculated) : 59.3  Temp (24hrs), Avg:97.6 F (36.4 C), Min:97.6 F (36.4 C), Max:97.6 F (36.4 C)   Recent Labs Lab 03/11/17 1641 03/11/17 1804  WBC  --  16.6*  CREATININE  --  2.90*  LATICACIDVEN 2.20* 1.4    Estimated Creatinine Clearance: 9.9 mL/min (A) (by C-G formula based on SCr of 2.9 mg/dL (H)).    Allergies  Allergen Reactions  . Penicillins Other (See Comments)    Pt family and pt does not know the reaction. Per records, she has tolerated Rocephin.    Antimicrobials this admission:  10/15 vanco >>  10/15 cefepime >> 10/15 10/15 meropenem >> 10/15 acyclovir >>  Dose adjustments this admission:   Microbiology results:  10/16 BCx:   Thank you for allowing pharmacy to be a part of this patient's care.  Doreene Eland, PharmD, BCPS.   Pager: [phone] 03/11/2017 8:12 PM

## 2017-03-11 NOTE — ED Notes (Signed)
Kathleen [name], NT attempted x 2 to obtain blood samples.

## 2017-03-11 NOTE — ED Provider Notes (Signed)
[facility] DEPT Provider Note   CSN: [id number] Arrival date & time: 03/11/17  1408     History   Chief Complaint Chief Complaint  Patient presents with  . Altered Mental Status    HPI Kathleen [name] is a 83 y.o. female.  HPI   Patient is an 83 year old female presenting with altered mental status.  According to EMS this is been going on for last several days. Patient apparently lives at home with family and she's been coming increasingly altered, No longer combative like usual.  Past Medical History:  Diagnosis Date  . A-fib ([place])   . Alzheimer's dementia   . Cancer colon     Patient Active Problem List   Diagnosis Date Noted  . Deep tissue injury 02/10/2017  . Severe malnutrition ([place]) 01/29/2017  . Anemia of chronic disease 01/29/2017  . Psychosis ([place]) 01/29/2017  . Vitamin B 12 deficiency 01/29/2017  . Decubitus ulcer of coccyx, stage IV ([place])   . Encounter for palliative care   . Goals of care, counseling/discussion   . Sepsis ([place]) 01/19/2017  . Hypokalemia 01/19/2017  . Hypoalbuminemia 01/19/2017  . Acute encephalopathy 06/12/2013  . Atrial fibrillation ([employer]) 06/12/2013  . Dementia of Alzheimer's type with behavioral disturbance 06/12/2013  . History of colon cancer 06/12/2012    Past Surgical History:  Procedure Laterality Date  . APPENDECTOMY    . CHOLECYSTECTOMY    . COLON SURGERY    . TONSILLECTOMY      OB History    No data available       Home Medications    Prior to Admission medications   Medication Sig Start Date End Date Taking? Authorizing Provider  acetaminophen (TYLENOL) 325 MG tablet Take 2 tablets (650 mg total) by mouth every 6 (six) hours as needed for mild pain (or Fever >/= 101). 01/28/17   Theodis Blaze, MD  Amino Acids-Protein Hydrolys (FEEDING SUPPLEMENT, PRO-STAT SUGAR FREE 64,) LIQD Take 30 mLs by mouth 2 (two) times daily.    [provider]  aspirin EC 81 MG tablet Take 81  mg by mouth daily as needed for mild pain (hemorrhoids).    [provider]  bisacodyl (DULCOLAX) 10 MG suppository Place 1 suppository (10 mg total) rectally daily as needed for moderate constipation. 01/28/17   Theodis Blaze, MD  cholestyramine Lucrezia Starch) 4 g packet Give 1 packet by mouth 2 hoursbefore meals for wound healing    [provider]  Cyanocobalamin (VITAMIN B-12 CR PO) Take 1,000 mcg by mouth daily.     [provider]  donepezil (ARICEPT) 10 MG tablet Take 10 mg by mouth at bedtime.    [provider]  ferrous sulfate 325 (65 FE) MG tablet Take 1 tablet (325 mg total) by mouth daily with breakfast. 01/29/17   Theodis Blaze, MD  memantine (NAMENDA) 10 MG tablet Take 10 mg by mouth 2 (two) times daily.    [provider]  Multiple Vitamins-Minerals (DECUBI-VITE) CAPS Take 1 capsule by mouth daily.    [provider]  Nutritional Supplements (NUTRITIONAL SUPPLEMENT PO) House 2.0 - [employer] - give 120cc three times a day for supplement    [provider]  Nutritional Supplements (NUTRITIONAL SUPPLEMENT PO) HSG Regular diet -  HSG Dysphagia Ground texture, regular consistency    [provider]  polyethylene glycol (MIRALAX / GLYCOLAX) packet Take 17 g by mouth daily as needed for mild constipation. 01/28/17   Mart Piggs  M, MD  QUEtiapine (SEROQUEL) 25 MG tablet Take 25 mg by mouth at bedtime.     [provider]  sulfamethoxazole-trimethoprim (BACTRIM DS,SEPTRA DS) 800-160 MG tablet Take 1 tablet by mouth every 12 (twelve) hours. 01/28/17   Theodis Blaze, MD  Vitamin D, Ergocalciferol, (DRISDOL) 50000 UNITS CAPS capsule Take 50,000 Units by mouth every 7 (seven) days. On Wednesday    [provider]    Family History Family History  Problem Relation Age of Onset  . Family history unknown: Yes    Social History Social History  Substance Use Topics  . Smoking status: Never Smoker  . Smokeless  tobacco: Never Used  . Alcohol use No     Allergies   Penicillins   Review of Systems Review of Systems  Unable to perform ROS: Dementia  Skin: Negative for rash.  All other systems reviewed and are negative.    Physical Exam Updated Vital Signs BP 97/62   Pulse (!) 106   Temp 97.6 F (36.4 C) (Axillary)   Resp (!) 40   SpO2 97%   Physical Exam  Constitutional: She appears well-developed and well-nourished.  HENT:  Head: Normocephalic and atraumatic.  Eyes: Right eye exhibits no discharge. Left eye exhibits no discharge.  Cardiovascular:  Tachycardia.  Pulmonary/Chest: Breath sounds normal. No respiratory distress.  Tachypnea without abnormal breath sounds.  Abdominal: Soft. She exhibits no distension. There is no tenderness.  Neurological:  Patient looking to the right gaze preference. Patient has good tone but unable to follow directions.  Skin: Skin is warm and dry. She is not diaphoretic.  Nursing note and vitals reviewed.    ED Treatments / Results  Labs (all labs ordered are listed, but only abnormal results are displayed) Labs Reviewed  CBG MONITORING, ED - Abnormal; Notable for the following:       Result Value   Glucose-Capillary 121 (*)    All other components within normal limits  CBC WITH DIFFERENTIAL/PLATELET  COMPREHENSIVE METABOLIC PANEL  URINALYSIS, ROUTINE W REFLEX MICROSCOPIC  I-STAT TROPONIN, ED  I-STAT CG4 LACTIC ACID, ED    EKG  EKG Interpretation None       Radiology Dg Chest 1 View  Result Date: 03/11/2017 CLINICAL DATA:  Shortness of breath EXAM: CHEST 1 VIEW COMPARISON:  Chest radiograph 01/19/2017 FINDINGS: The lungs are hyperinflated without focal consolidation or pulmonary edema. No pleural effusion or pneumothorax. Normal cardiomediastinal contours. Right shoulder osteoarthrosis. IMPRESSION: Hyperinflated lungs may indicate underlying COPD. No focal airspace disease. Electronically Signed   By: Ulyses Jarred M.D.   On:  03/11/2017 15:13   Ct Head Wo Contrast  Result Date: 03/11/2017 CLINICAL DATA:  Altered mental status. EXAM: CT HEAD WITHOUT CONTRAST TECHNIQUE: Contiguous axial images were obtained from the base of the skull through the vertex without intravenous contrast. COMPARISON:  CT head dated June 22, 2015. FINDINGS: Brain: No evidence of acute infarction, hemorrhage, hydrocephalus, extra-axial collection or mass lesion/mass effect. Moderate age-related cerebral atrophy with compensatory dilatation of the ventricles, similar to prior study. Vascular: No hyperdense vessel or unexpected calcification. Skull: Negative for fracture or focal lesion. Sinuses/Orbits: The bilateral paranasal sinuses and mastoid air cells are clear. Bilateral pseudophakia. No acute orbital abnormality. Other: None. IMPRESSION: No acute intracranial abnormality.  Stable cerebral atrophy. Electronically Signed   By: Titus Dubin M.D.   On: 03/11/2017 15:27    Procedures Procedures (including critical care time)  Medications Ordered in ED Medications  sodium chloride 0.9 % bolus 1,000  mL (not administered)     Initial Impression / Assessment and Plan / ED Course  I have reviewed the triage vital signs and the nursing notes.  Pertinent labs & imaging results that were available during my care of the patient were reviewed by me and considered in my medical decision making (see chart for details).     Patient is an 83 year old female presenting with altered mental status.  According to EMS this is been going on for last several days. Patient apparently lives at home with family and she's been coming increasingly altered, No longer combative like usual.  3:52 PM Nursing unable to get blood. We will do US guided IV stick. Pt dnr dni\  4:41 PM Patient coming here they report a slow decline. Therefore I think subclinical status is less likely.Could pursue inpatient MRI.  Lactic came back elevated with mild elevation in  troponin. EKG nonischemic. We will start sepsis protocol.   5:14 PM Discussed with neurology. We will plan to give Keppra load, admitted to the stepdown at [name] when they arrive at [facility]  Blood clotted X 2, will sign out to oncoming physician follow up CBC and [facility] Performed by: Gardiner Sleeper Total critical care time: 75 minutes Critical care time was exclusive of separately billable procedures and treating other patients. Critical care was necessary to treat or prevent imminent or life-threatening deterioration. Critical care was time spent personally by me on the following activities: development of treatment plan with patient and/or surrogate as well as nursing, discussions with consultants, evaluation of patient's response to treatment, examination of patient, obtaining history from patient or surrogate, ordering and performing treatments and interventions, ordering and review of laboratory studies, ordering and review of radiographic studies, pulse oximetry and re-evaluation of patient's condition.    Final Clinical Impressions(s) / ED Diagnoses   Final diagnoses:  None    New Prescriptions New Prescriptions   No medications on file     Macarthur Critchley, MD 03/12/17 [phone]

## 2017-03-11 NOTE — ED Notes (Signed)
EDP had to femoral stick patient to obtain blood work. EDP aware that pending labs may be delayed.

## 2017-03-11 NOTE — Consult Note (Signed)
NEURO HOSPITALIST CONSULT NOTE   Requestig physician: Dr. Blaine Hamper  Reason for Consult: AMS with seizure like activity  History obtained from:  Daughter and Chart     HPI:                                                                                                                                          Kathleen [name] is an 83 y.o. female presenting to the [facility] ED on Monday with altered mental status. EMS was called by family when patient stopped responding verbally. Some repetitive waving motions of her right hand were noted by family, which would resolve with tactile stimulation. When EMS arrived, daughter related to them that AMS has been going on for last several days and has steadily worsened. Patient apparently lives at home with family and she's been coming increasingly altered, no longer combative like usual. Daughter states symptoms began about Wednesday or Thursday with malaise and decreased po intake. She has underlying dementia. Also with atrial fibrillation.   Numerous abnormalities noted during initial ED evaluation, including severe hypernatremia (175), markedly elevated chloride and BUN/Cr of 112/2.9, most consistent with dehydration. Also with mildly elevated transaminases, elevated troponin, hypercalcemia and leukocytosis of 16.6.    CT head in the ED revealed stable cerebral atrophy, with no acute intracranial abnormality seen.   Past Medical History:  Diagnosis Date  . A-fib ([place])   . Alzheimer's dementia   . Cancer colon     Past Surgical History:  Procedure Laterality Date  . APPENDECTOMY    . CHOLECYSTECTOMY    . COLON SURGERY    . TONSILLECTOMY      Family History  Problem Relation Age of Onset  . Family history unknown: Yes   Social History:  reports that she has never smoked. She has never used smokeless tobacco. She reports that she does not drink alcohol or use drugs.  Allergies  Allergen Reactions  . Penicillins Other (See Comments)     Pt family and pt does not know the reaction. Per records, she has tolerated Rocephin.    HOME MEDICATIONS:  ROS:                                                                                                                                       Unable to obtain due to AMS.   Blood pressure (!) 97/50, pulse 79, temperature 97.6 F (36.4 C), temperature source Axillary, resp. rate (!) 23, height _0  (1.676 m), weight 47.6 kg (105 lb), SpO2 100 %.  General Examination:                                                                                                      HEENT-  [place]/AT. Prominently noted is nuchal rigidity to flexion/extension and rotation.    Lungs- Nontachypneic. Respirations unlabored.  Extremities- Warm and well perfused  Neurological Examination Mental Status: Obtunded. Initially asleep, she opens eyes after repetitive light noxious stimulation. Does not answer questions, follow commands or attempt to communicate.  Cranial Nerves: II: PERRL. No blink to threat.  III,IV, VI: No ptosis noted. Eyes conjugate at and near the midline with occasional movement. Does not track or fixate on visual stimuli. No nystagmus or tonic deviation noted.  V,VII: Face grossly symmetric. Blinks to light eyelid stimulation bilaterally.  VIII: No response to voice.  IX,X: Unable to visualize palate.  XI: Head rotated preferentially to right.  XII: Does not follow command for tongue protrusion. Motor/Sensory: Decreased bulk throughout.  Moves RUE antigravity to scratch face. Unable to formally assess strength.  Minimal movement of LUE to noxious RLE: Withdraws antigravity to noxious LLE: Minimal movement to noxious Deep Tendon Reflexes: Mildly hyperactive upper extremity reflexes. 3+ patellae bilaterally. 0 achilles bilaterally. Toes upgoing bilaterally.    Cerebellar/Gait: Unable to assess  Lab Results: Basic Metabolic Panel:  Recent Labs Lab 03/11/17 1804  NA 175*  K 3.6  CL >130*  CO2 25  GLUCOSE 164*  BUN 112*  CREATININE 2.90*  CALCIUM 10.6*    Liver Function Tests:  Recent Labs Lab 03/11/17 1804  AST 61*  ALT 92*  ALKPHOS 126  BILITOT 1.9*  PROT 6.6  ALBUMIN 1.7*   No results for input(s): LIPASE, AMYLASE in the last 168 hours.  Recent Labs Lab 03/11/17 1804  AMMONIA 34    CBC:  Recent Labs Lab 03/11/17 1804  WBC 16.6*  NEUTROABS 14.9*  HGB 9.8*  HCT 34.3*  MCV 94.8  PLT 134*    Cardiac Enzymes: No results for input(s): CKTOTAL, CKMB, CKMBINDEX, TROPONINI in the last 168 hours.  Lipid  Panel: No results for input(s): CHOL, TRIG, HDL, CHOLHDL, VLDL, LDLCALC in the last 168 hours.  CBG:  Recent Labs Lab 03/11/17 1450  GLUCAP 121*    Microbiology: Results for orders placed or performed during the hospital encounter of 03/11/17  Blood Culture (routine x 2)     Status: None (Preliminary result)   Collection Time: 03/11/17  6:04 PM  Result Value Ref Range Status   Specimen Description BLOOD RIGHT ANTECUBITAL  Final   Special Requests   Final    BOTTLES DRAWN AEROBIC AND ANAEROBIC Blood Culture adequate volume Performed at [facility], [address] [place], [place] [address]    Culture PENDING  Incomplete   Report Status PENDING  Incomplete    Coagulation Studies: No results for input(s): LABPROT, INR in the last 72 hours.  Imaging: Dg Chest 1 View  Result Date: 03/11/2017 CLINICAL DATA:  Shortness of breath EXAM: CHEST 1 VIEW COMPARISON:  Chest radiograph 01/19/2017 FINDINGS: The lungs are hyperinflated without focal consolidation or pulmonary edema. No pleural effusion or pneumothorax. Normal cardiomediastinal contours. Right shoulder osteoarthrosis. IMPRESSION: Hyperinflated lungs may indicate underlying COPD. No focal airspace disease. Electronically Signed   By: Ulyses Jarred M.D.   On: 03/11/2017 15:13   Ct Head Wo Contrast  Result Date: 03/11/2017 CLINICAL DATA:  Altered mental status. EXAM: CT HEAD WITHOUT CONTRAST TECHNIQUE: Contiguous axial images were obtained from the base of the skull through the vertex without intravenous contrast. COMPARISON:  CT head dated June 22, 2015. FINDINGS: Brain: No evidence of acute infarction, hemorrhage, hydrocephalus, extra-axial collection or mass lesion/mass effect. Moderate age-related cerebral atrophy with compensatory dilatation of the ventricles, similar to prior study. Vascular: No hyperdense vessel or unexpected calcification. Skull: Negative for fracture or focal lesion. Sinuses/Orbits: The bilateral paranasal sinuses and mastoid air cells are clear. Bilateral pseudophakia. No acute orbital abnormality. Other: None. IMPRESSION: No acute intracranial abnormality.  Stable cerebral atrophy. Electronically Signed   By: Titus Dubin M.D.   On: 03/11/2017 15:27    Assessment: 83 year old female presenting with AMS in the setting of severe dehydration with multiple additional lab abnormalities.  1. Questionable seizure activity per description provided by daughter. Abnormal movements of RUE could be stopped with tactile stimulation, which would be atypical for seizure. Most likely the movements were secondary to agitation; however, will obtain EEG in the AM to further evaluate. No clinical seizure activity is seen on Neurological examination. 2. Left sided weakness on exam. Per family, this is new, but with unclear time of onset. Not a tPA candidate. Possible new right frontal lobe stroke, lacunar versus MCA branch. CT reveals no acute change, but can miss early infarction or brainstem pathology. Will need MRI brain to further evaluate.  3. Nuchal rigidity on exam in conjunction with AMS and leukocytosis. Meningitis is on DDx.  4. Severe dehydration.  5. Infected decubitus ulcer.   Recommendations: 1. Start empiric  antibiotics for possible meningitis/encephalitis: Vancomycin, cefepime. If administering acyclovir, risks/benefits should be considered in the setting of AKI. Will need a substitute for ampicillin given her documented penicillin allergy.  2. CSF sampling is indicated, but this may be difficult given infected decubitus ulcer. Would obtain fluoro guided LP if there is a level at which needle can be inserted that is clear of infection.  3. EEG in AM. 4. Gradual rehydration with slow correction of serum sodium. Frequent assessment of serum Na levels to prevent overly rapid correction. Do not exceed Na change  of 10 meq/L/day or 0.5 meq/L/hr to reduce the risk of osmotic demyelination syndrome.  5. Decreasing Keppra dose to 500 mg IV q24 hours given renal impairment with eGFR of 16. 6. Seizure precautions.  7. MRI brain.  40 minutes spent in the emergent Neurological evaluation and management of this critically ill patient.   Electronically signed: Dr. Kerney Elbe 03/11/2017, 10:42 PM

## 2017-03-11 NOTE — ED Provider Notes (Signed)
Care assumed from Dr. Thomasene Lot, patient needs to be admitted to stepdown unit at [facility] with possible sepsis. Lactic acid level mildly elevated at 2.2. Troponin elevated at 0.13 which is suspected to be demand ischemia. Care was turned over to me with patient's CBC and metabolic panel pending. Apparently, blood in the laboratory had clotted. I have drawn blood via right femoral vein stick and sent new specimens to the laboratory. Low amplitude focal seizure seen involving the right upper extremity.  CENTRAL Vein Blood Draw Performed by: OACZY,SAYTK Consent: The procedure was performed in an emergent situation. Required items: required blood products, implants, devices, and special equipment available Patient identity confirmed: arm band and provided demographic data Time out: Immediately prior to procedure a "time out" was called to verify the correct patient, procedure, equipment, support staff and site/side marked as required. Indications: need for blood for laboratory testing Anesthesia: none Patient sedated: no Preparation: skin prepped with 2% chlorhexidine Skin prep agent dried: skin prep agent completely dried prior to procedure Location details: right femoral vein Needle size: 21-gauge Pre-procedure: landmarks identified Ultrasound guidance: no Successful blood drawt: yes - 20 mL blood obtained and sent to laboratory Patient tolerance: Patient tolerated the procedure well with no immediate complications.  7:27 PM Laboratory evaluation shows severe hyponatremia with sodium 175, severe dehydration with BUN 112, creatinine 2.9. Last values on record were 02/04/2017 with sodium 133, creatinine 0.75, BUN 10. Also, mild elevation of transaminases and bilirubin of uncertain significance. Anemia is present, with hemoglobin dropped from 11.3 to 9.8. She is given additional IV fluids. Family is present and has been advised of seriousness of her condition. Case is discussed with Dr. Blaine Hamper  of [facility] who agrees to see the patient in preparation for transfer her to [facility] stepdown unit where neurology can evaluate her. At this point, I feel that her mental status and focal seizure are easily explained by her electrolyte disturbance.  CRITICAL CARE Performed by: Delora Fuel Total critical care time: 35 minutes Critical care time was exclusive of separately billable procedures and treating other patients. Critical care was necessary to treat or prevent imminent or life-threatening deterioration. Critical care was time spent personally by me on the following activities: development of treatment plan with patient and/or surrogate as well as nursing, discussions with consultants, evaluation of patient's response to treatment, examination of patient, obtaining history from patient or surrogate, ordering and performing treatments and interventions, ordering and review of laboratory studies, ordering and review of radiographic studies, pulse oximetry and re-evaluation of patient's condition.   Delora Fuel, MD 16/01/09 [phone]

## 2017-03-11 NOTE — ED Notes (Signed)
While inserting I&O patient stared to have puss like discharge from her urethra area. No urine was obtained but puss sample was. EDP said to collect and send it down. 2 attempts were maid to try and obtain urine. Both attempts had puss filled tubing.

## 2017-03-11 NOTE — ED Notes (Signed)
Patient going to CT

## 2017-03-11 NOTE — ED Notes (Signed)
Pt returned from CT °

## 2017-03-11 NOTE — Progress Notes (Signed)
A consult was received from an ED physician for vancomycin and zosyn per pharmacy dosing.  The patient's profile has been reviewed for ht/wt/allergies/indication/available labs.   one time orders have been placed for vancomycin and cefepime (PCN allergy with unknown rxn so avoid zosyn and give cefepime as given ceftriaxone in past).  Further antibiotics/pharmacy consults should be ordered by admitting physician if indicated.                       Thank you, Clovis Riley 03/11/2017  5:18 PM

## 2017-03-11 NOTE — ED Notes (Signed)
Spoke with Dr. Thomasene Lot about unable to obtain AMMONIA blood test due to being a hard stick and needing the blood to be immediately on ice. She reports it can be obtained later.

## 2017-03-11 NOTE — ED Notes (Signed)
Date and time results received: 03/11/17 11:46 PM (use smartphrase ".now" to insert current time)  Test: Na, Cl, troponin Critical Value: Na 172, Cl >130, Trop 0.15  Name of Provider Notified: Blaine Hamper, MD  Orders Received? Or Actions Taken?: None

## 2017-03-11 NOTE — ED Notes (Signed)
Bed: RESB Expected date:  Expected time:  Means of arrival:  Comments: EMS-sepsis 

## 2017-03-12 ENCOUNTER — Inpatient Hospital Stay (HOSPITAL_COMMUNITY)
Admission: EM | Admit: 2017-03-12 | Discharge: 2017-03-12 | Disposition: A | Discharge status: Home / Self Care | Payer: Medicare Other | Source: Home / Self Care | Attending: Internal Medicine | Admitting: Internal Medicine

## 2017-03-12 ENCOUNTER — Inpatient Hospital Stay (HOSPITAL_COMMUNITY): Payer: Medicare Other

## 2017-03-12 DIAGNOSIS — G9340 Encephalopathy, unspecified: Secondary | ICD-10-CM

## 2017-03-12 LAB — MRSA PCR SCREENING: MRSA by PCR: NEGATIVE

## 2017-03-12 LAB — BASIC METABOLIC PANEL
BUN: 100 mg/dL — ABNORMAL HIGH (ref 6–20)
BUN: 101 mg/dL — ABNORMAL HIGH (ref 6–20)
BUN: 104 mg/dL — ABNORMAL HIGH (ref 6–20)
CALCIUM: 10.6 mg/dL — AB (ref 8.9–10.3)
CO2: 21 mmol/L — ABNORMAL LOW (ref 22–32)
CO2: 18 mmol/L — ABNORMAL LOW (ref 22–32)
CO2: 21 mmol/L — ABNORMAL LOW (ref 22–32)
Calcium: 10.1 mg/dL (ref 8.9–10.3)
Calcium: 10 mg/dL (ref 8.9–10.3)
Chloride: >130 mmol/L (ref 101–111)
Creatinine, Ser: 2.39 mg/dL — ABNORMAL HIGH (ref 0.44–1.00)
Creatinine, Ser: 2.43 mg/dL — ABNORMAL HIGH (ref 0.44–1.00)
Creatinine, Ser: 2.39 mg/dL — ABNORMAL HIGH (ref 0.44–1.00)
GFR calc Af Amer: 20 mL/min — ABNORMAL LOW (ref >60)
GFR calc Af Amer: 19 mL/min — ABNORMAL LOW (ref >60)
GFR calc non Af Amer: 17 mL/min — ABNORMAL LOW (ref >60)
GFR, EST AFRICAN AMERICAN: 20 mL/min — AB (ref >60)
GFR, EST NON AFRICAN AMERICAN: 17 mL/min — AB (ref >60)
GFR, EST NON AFRICAN AMERICAN: 17 mL/min — AB (ref >60)
GLUCOSE: 120 mg/dL — AB (ref 65–99)
Glucose, Bld: 96 mg/dL (ref 65–99)
Glucose, Bld: 94 mg/dL (ref 65–99)
POTASSIUM: 3.8 mmol/L (ref 3.5–5.1)
POTASSIUM: 5.2 mmol/L — AB (ref 3.5–5.1)
POTASSIUM: 3.8 mmol/L (ref 3.5–5.1)
SODIUM: 170 mmol/L — AB (ref 135–145)
SODIUM: 171 mmol/L — AB (ref 135–145)
Sodium: 173 mmol/L (ref 135–145)

## 2017-03-12 LAB — TROPONIN I: Troponin I: 0.1 ng/mL (ref <0.03)

## 2017-03-12 LAB — URINALYSIS, ROUTINE W REFLEX MICROSCOPIC
Bilirubin Urine: NEGATIVE
GLUCOSE, UA: NEGATIVE mg/dL
Ketones, ur: NEGATIVE mg/dL
LEUKOCYTES UA: NEGATIVE
NITRITE: NEGATIVE
PH: 5 (ref 5.0–8.0)
Protein, ur: 30 mg/dL — AB
SPECIFIC GRAVITY, URINE: 1.02 (ref 1.005–1.030)

## 2017-03-12 LAB — LIPID PANEL
Cholesterol: 174 mg/dL (ref 0–200)
HDL: 23 mg/dL — ABNORMAL LOW (ref >40)
LDL Cholesterol: 123 mg/dL — ABNORMAL HIGH (ref 0–99)
Total CHOL/HDL Ratio: 7.6 RATIO
Triglycerides: 138 mg/dL (ref <150)
VLDL: 28 mg/dL (ref 0–40)

## 2017-03-12 LAB — CBC
HCT: 33.1 % — ABNORMAL LOW (ref 36.0–46.0)
Hemoglobin: 9.5 g/dL — ABNORMAL LOW (ref 12.0–15.0)
MCH: 27.3 pg (ref 26.0–34.0)
MCHC: 28.7 g/dL — ABNORMAL LOW (ref 30.0–36.0)
MCV: 95.1 fL (ref 78.0–100.0)
PLATELETS: 129 10*3/uL — AB (ref 150–400)
RBC: 3.48 MIL/uL — AB (ref 3.87–5.11)
RDW: 20.5 % — ABNORMAL HIGH (ref 11.5–15.5)
WBC: 18.4 10*3/uL — ABNORMAL HIGH (ref 4.0–10.5)

## 2017-03-12 LAB — HEMOGLOBIN A1C
Hgb A1c MFr Bld: 5.8 % — ABNORMAL HIGH (ref 4.8–5.6)
Mean Plasma Glucose: 119.76 mg/dL

## 2017-03-12 LAB — GLUCOSE, CAPILLARY: GLUCOSE-CAPILLARY: 107 mg/dL — AB (ref 65–99)

## 2017-03-12 LAB — SODIUM, URINE, RANDOM: Sodium, Ur: 21 mmol/L

## 2017-03-12 LAB — OSMOLALITY, URINE: OSMOLALITY UR: 564 mosm/kg (ref 300–900)

## 2017-03-12 LAB — CORTISOL-AM, BLOOD

## 2017-03-12 LAB — ECHOCARDIOGRAM COMPLETE
HEIGHTINCHES: 66 in
WEIGHTICAEL: 1680 [oz_av]

## 2017-03-12 LAB — CREATININE, URINE, RANDOM: CREATININE, URINE: 170.54 mg/dL

## 2017-03-12 LAB — OSMOLALITY: Osmolality: 399 mOsm/kg (ref 275–295)

## 2017-03-12 MED ORDER — POTASSIUM CHLORIDE 10 MEQ/100ML IV SOLN
10.0000 meq | INTRAVENOUS | Status: AC
  Administered 2017-03-12 (×2): 10 meq via INTRAVENOUS
  Filled 2017-03-12: qty 100

## 2017-03-12 MED ORDER — SODIUM CHLORIDE 0.9 % IV BOLUS (SEPSIS)
500.0000 mL | Freq: Once | INTRAVENOUS | Status: AC
Start: 2017-03-12 — End: 2017-03-12
  Administered 2017-03-12: 500 mL via INTRAVENOUS

## 2017-03-12 MED ORDER — HYDROCORTISONE NA SUCCINATE PF 100 MG IJ SOLR
100.0000 mg | Freq: Once | INTRAMUSCULAR | Status: AC
  Administered 2017-03-12: 100 mg via INTRAVENOUS
  Filled 2017-03-12: qty 2

## 2017-03-12 MED ORDER — MORPHINE SULFATE (PF) 4 MG/ML IV SOLN: 1.0000 mg | INTRAVENOUS | Status: DC | PRN

## 2017-03-12 MED ORDER — DEXTROSE 5 % IV SOLN
INTRAVENOUS | Status: DC
  Administered 2017-03-12 – 2017-03-14 (×6): via INTRAVENOUS

## 2017-03-12 MED ORDER — DEXTROSE 5 % IV SOLN
500.0000 mg | Freq: Every day | INTRAVENOUS | Status: DC
  Administered 2017-03-12 – 2017-03-14 (×3): 500 mg via INTRAVENOUS
  Filled 2017-03-12 (×3): qty 10

## 2017-03-12 MED ORDER — SODIUM CHLORIDE 0.9 % IV BOLUS (SEPSIS)
500.0000 mL | Freq: Once | INTRAVENOUS | Status: AC
  Administered 2017-03-12: 500 mL via INTRAVENOUS

## 2017-03-12 MED ORDER — SODIUM CHLORIDE 0.9 % IV SOLN
1.0000 g | Freq: Two times a day (BID) | INTRAVENOUS | Status: DC
  Administered 2017-03-12 – 2017-03-14 (×6): 1 g via INTRAVENOUS
  Filled 2017-03-12 (×8): qty 1

## 2017-03-12 MED ORDER — ORAL CARE MOUTH RINSE
15.0000 mL | Freq: Two times a day (BID) | OROMUCOSAL | Status: DC
  Administered 2017-03-12 – 2017-03-16 (×7): 15 mL via OROMUCOSAL

## 2017-03-12 MED ORDER — SODIUM CHLORIDE 0.9 % IV SOLN
500.0000 mg | INTRAVENOUS | Status: DC
  Administered 2017-03-12 – 2017-03-13 (×2): 500 mg via INTRAVENOUS
  Filled 2017-03-12 (×2): qty 5

## 2017-03-12 MED ORDER — OXYCODONE HCL 20 MG/ML PO CONC
5.0000 mg | ORAL | Status: DC | PRN
  Administered 2017-03-15 (×2): 5 mg via SUBLINGUAL
  Filled 2017-03-12: qty 1

## 2017-03-12 NOTE — Care Management Note (Signed)
Case Management Note  Patient Details  Name: Kathleen [name] MRN: [id number] Date of Birth: [birth year]  Subjective/Objective:                  ams in 83 year old female  Action/Plan: Date:  October 16,2 018 Chart reviewed for concurrent status and case management needs.  Will continue to follow patient progress.  Discharge Planning: following for needs  Expected discharge date: [id number]  Velva Harman, BSN, [place], [place]   Expected Discharge Date:   (unknown)               Expected Discharge Plan:  Home/Self Care  In-House Referral:     Discharge planning Services  CM Consult  Post Acute Care Choice:  Resumption of Svcs/PTA Provider Choice offered to:     DME Arranged:    DME Agency:     HH Arranged:    [place] Agency:     Status of Service:  In process, will continue to follow  If discussed at Long Length of Stay Meetings, dates discussed:    Additional Comments:  Leeroy Cha, RN 03/12/2017, 8:53 AM

## 2017-03-12 NOTE — Progress Notes (Signed)
Critical labs sent to [facility], NP.

## 2017-03-12 NOTE — Progress Notes (Signed)
Pharmacy Antibiotic Note  Kathleen [name] is a 83 y.o. female admitted on 03/11/2017 with sepsis/meningitis/infected decubitus.  Pharmacy has been consulted for vancomycin meropenem & acyclovir dosing.   Today, 03/12/2017  Renal: AKI- improving Scr 2.79-->2.47 (baseline ~0.8); estimated CrCl ~10-20 ml/min  WBC elevated  Afebrile  Plan:  Vancomycin 1gm x 1 given in ED  Random level 10/17 with am labs  Increase Meropenem 1gm IV q12h  Increase Acyclovir 500mg  (10mg /kg) IV q24h  Monitor renal fx closely  F/u LP results (if able to obtain) and cultures to help narrow antibiotic  Daily SCr  Height: 5\' 6"  (167.6 cm) Weight: 105 lb (47.6 kg) IBW/kg (Calculated) : 59.3  Temp (24hrs), Avg:96.9 F (36.1 C), Min:96 F (35.6 C), Max:97.8 F (36.6 C)   Recent Labs Lab 03/11/17 1641 03/11/17 1804 03/11/17 2242 03/11/17 2243 03/12/17 0327  WBC  --  16.6*  --   --   --   CREATININE  --  2.90* 2.79*  --  2.47*  LATICACIDVEN 2.20* 1.4  --  1.5  --     Estimated Creatinine Clearance: 11.6 mL/min (A) (by C-G formula based on SCr of 2.47 mg/dL (H)).    Allergies  Allergen Reactions  . Penicillins Other (See Comments)    Pt family and pt does not know the reaction. Per records, she has tolerated Rocephin.    Antimicrobials this admission:  10/15 vanco >>  10/15 cefepime >> 10/15 10/15 meropenem >> 10/15 acyclovir >>  Dose adjustments this admission: 10/16: Increased Meropenem to q12h, Acyclovir to 10mg /kg for improving renal fxn Microbiology results: 10/15 BCx:  10/16 Ucx 10/15 MRSA PCR: negative  Thank you for allowing pharmacy to be a part of this patient's care.  Netta Cedars, PharmD, BCPS Pager: [phone] 03/12/2017 8:17 AM

## 2017-03-12 NOTE — Progress Notes (Signed)
Labs called to Dr Charlies Silvers, acknowledged results.

## 2017-03-12 NOTE — Progress Notes (Signed)
CRITICAL VALUE ALERT  Critical Value: Na- 169 & Cl ^ 130  Date & Time Notied: 1-/16/18 @ 0330   Provider Notified

## 2017-03-12 NOTE — Consult Note (Signed)
[place] Nurse wound consult note Reason for Consult: Consult requested for sacrum and other locations.  Pt is familiar to [facility] nurse from previous admission, refer to progress note on 9/10. Pt is very emaciated and is frequently incontinent, it is difficult to keep wound from becoming soiled related to the close proximity to the rectum. Wound type: Chronic stage 4 pressure injury to sacrum; 9X5.5X1cm with 3 cm undermining to wound edges. Beefy red with exposed bone, mod amt tan drainage, some odor. Left head above ear with deep tissue pressure injury; 1X1cm, dark reddish purple. Right middle ear with with deep tissue pressure injury; 2X1cm, dark reddish purple. Left hip with deep tissue pressure injury; 4X2cm, darker colored skin. Right inner ankle with with deep tissue pressure injury; 4X2cm, darker red colored skin. Left inner ankle with with deep tissue pressure injury; 4X2cm, darker red colored skin. Right inner foot with with deep tissue pressure injury; .5X.5 cm, darker red colored skin Left inner foot with with deep tissue pressure injury; .5X.5 cm, darker red colored skin Right inner leg with partial thickness abrasion, 1X.5cm, red and moist Left heel with dry cracked callous.  Left anterior plantar foot with dry cracked callous; no topical treatment needed for these sites. Pressure Injury POA: Yes Dressing procedure/placement/frequency: Pt is on a low airloss  mattress to reduce pressure.  Prevalon boots to BLE to reduce pressure.  Foam dressing to protect leg abrasion. Moist gauze dressing to sacrum to promote healing. Dietician is consulting to optimize protein. No family present to discuss plan of care. Please re-consult if further assistance is needed.  Thank-you,  Julien Girt MSN, [place], [place], [place], [place]

## 2017-03-12 NOTE — Consult Note (Signed)
Consultation Note Date: 03/12/2017   Patient Name: Kathleen [name]  DOB: [birth year]  MRN: [id number]  Age / Sex: 83 y.o., female  PCP: Jani Gravel, MD Referring Physician: Robbie Lis, MD  Reason for Consultation: Establishing goals of care  HPI/Patient Profile: 83 y.o. female  with past medical history of a. Fib, dementia, anemia, colon cancer, alzheimer's, COPD, infected decubitus, malnutrition  admitted on 03/11/2017 with altered mental status.  Workup revealed elevated WBC and lactic acid, significantly elevated serum sodium, CT and MRI negative for acute processes, EEG showed diffuse slowing and dysfunction but no seizure activity. Urine positive for few bacteria, negative for leukocytes- culture pending, blood culture pending. Palliative medicine consulted for [place].   Clinical Assessment and Goals of Care:  I have reviewed medical records including EPIC notes, labs and imaging, received report from patient's nurse, assessed the patient and then met privately with patient's daughter and HCPOA- Kathleen [name] to discuss diagnosis prognosis, [place], EOL wishes, disposition and options.  Palliative medicine has met with Kathleen [name] during patient's previous admission 8/26-9/3 for infected decubitus ulcer of the sacrum. She was discharged during this admission to SNF for hydrotherapy and then d/c'd home with  24hr care.   I introduced Palliative Medicine as specialized medical care for people living with serious illness. It focuses on providing relief from the symptoms and stress of a serious illness. The goal is to improve quality of life for both the patient and the family.  We discussed a brief life review of the patient. Before she was debilitated by Alzheimer's she enjoyed traveling. Patient and her spouse took frequent cruises- she especially loved [place]. Patient's husband died suddenly of a heart attack several  years ago.   As far as functional and nutritional status- she really has had no functional status for several months. Kathleen [name] notes she has not been walking for several months- has been bedbound since before her previous admission. She has multiple pressure wounds on her sacrum, heels, ear. Per Kathleen [name] she has been eating and drinking less and less. Recently she has only been taking in freeze pops and that is only when they are held to her lips. She has not been requesting food or drink.   We discussed their current illness and what it means in the larger context of their on-going co-morbidities.  Natural disease trajectory and expectations at EOL were discussed. Kathleen [name] described the natural trajectory of Alzheimer's and the cessation of eating and drinking without prompting from myself. When asked if she believed her mom was at that point she said, "I don't want to believe it, but yes." We discussed that most test results are indicating that patient is at end of life from natural course of Alzheimer's. We discussed the dying process and how people die from Alzheimer's.   I attempted to elicit values and goals of care important to the patient. Kathleen [name] states it is important for her mom not to be in pain, but not "doped up". She agreed to low dose opioid use if  her Mom appears uncomfortable. Kathleen [name] is hopeful for a return to her mother's baseline- she would like her mother to be able to smile at her and to be able to return home where she is receiving 24 hr nursing care.  The difference between aggressive medical intervention and comfort care was considered in light of the patient's goals of care. Kathleen [name] would like to continue care at the current level for now, and reevaluate in the morning.   Advanced directives, concepts specific to code status, artifical feeding and hydration, and rehospitalization were considered and discussed.  Hospice and Palliative Care services outpatient were explained and  offered.  Questions and concerns were addressed.  The family was encouraged to call with questions or concerns.    Primary Decision Maker HCPOA - patient's daughter- Kathleen [name]    SUMMARY OF RECOMMENDATIONS -Pt appears to be at end stage dementia process, not eating or drinking, Albumin 1.7 indicating high likelihood of mortality, very poor prognosis -Discussed with daughter that if patient survives this hospitalization, infectious process, dehydration, and recurrent processes and rehospitalizations are likely to recur -Comfort measures vs continued aggressive care were discussed. Kathleen [name] wished to continue current level of care without escalating tonight. She declines LP procedure for her mother. She also wishes for some pain/comfort options to be available if her mom appears to be in pain or uncomfortable and can't express it- this is very likely due to her multiple decubitis wounds -Will start oxyfast 85m SL q4hrs for pain or discomfort -PMT will followup with [facility] [place] the morning for continued [place]    Code Status/Advance Care Planning:  DNR    Symptom Management:   As above  Palliative Prophylaxis:   Delirium Protocol  Additional Recommendations (Limitations, Scope, Preferences):  patient's daughter declines LP   Psycho-social/Spiritual:   Desire for further Chaplaincy support:yes  Additional Recommendations: Caregiving  Support/Resources  Prognosis:    Unable to determine  Discharge Planning: To Be Determined  Primary Diagnoses: Present on Admission: . Acute metabolic encephalopathy . Anemia of chronic disease . Atrial fibrillation ([place] . Decubitus ulcer of coccyx, stage IV ([place] . Dementia of Alzheimer's type with behavioral disturbance . Sepsis ([place] . Severe malnutrition ([place] . Hypernatremia . Hypercalcemia . Elevated troponin . AKI (acute kidney injury) ([place] . Severe dehydration . Abnormal LFTs   I have reviewed the medical record,  interviewed the patient and family, and examined the patient. The following aspects are pertinent.  Past Medical History:  Diagnosis Date  . A-fib ([place]   . Alzheimer's dementia   . Cancer colon    Social History   Social History  . Marital status: Widowed    Spouse name: N/A  . Number of children: N/A  . Years of education: N/A   Social History Main Topics  . Smoking status: Never Smoker  . Smokeless tobacco: Never Used  . Alcohol use No  . Drug use: No  . Sexual activity: No   Other Topics Concern  . None   Social History Narrative  . None   Family History  Problem Relation Age of Onset  . Family history unknown: Yes   Scheduled Meds: . aspirin  300 mg Rectal Daily  . mouth rinse  15 mL Mouth Rinse BID   Continuous Infusions: . sodium chloride 50 mL/hr at 03/12/17 0500  . acyclovir    . levETIRAcetam Stopped (03/12/17 0519)  . meropenem (MERREM) IV Stopped (03/12/17 1222)   PRN Meds:.hydrALAZINE, hydrOXYzine, LORazepam, morphine injection Medications Prior  to Admission:  Prior to Admission medications   Medication Sig Start Date End Date Taking? Authorizing Provider  acetaminophen (TYLENOL) 325 MG tablet Take 2 tablets (650 mg total) by mouth every 6 (six) hours as needed for mild pain (or Fever >/= 101). 01/28/17  Yes Theodis Blaze, MD  cholestyramine Lucrezia Starch) 4 g packet Give 1 packet by mouth 2 hoursbefore meals for wound healing   Yes [provider]  Cyanocobalamin (VITAMIN B-12 CR PO) Take 1,000 mcg by mouth once a week.    Yes [provider]  donepezil (ARICEPT) 10 MG tablet Take 10 mg by mouth at bedtime.   Yes [provider]  Iron-Vitamins (GERITOL PO) Take 15 mLs by mouth daily.   Yes [provider]  memantine (NAMENDA) 10 MG tablet Take 10 mg by mouth 2 (two) times daily.   Yes [provider]  Multiple Vitamins-Minerals (DECUBI-VITE) CAPS Take 1 capsule by mouth daily.   Yes [provider]    Nutritional Supplements (NUTRITIONAL SUPPLEMENT PO) House 2.0 - [employer] - give 120cc three times a day for supplement   Yes [provider]  Amino Acids-Protein Hydrolys (FEEDING SUPPLEMENT, PRO-STAT SUGAR FREE 64,) LIQD Take 30 mLs by mouth 2 (two) times daily.    [provider]  aspirin EC 81 MG tablet Take 81 mg by mouth daily as needed for mild pain (hemorrhoids).    [provider]  bisacodyl (DULCOLAX) 10 MG suppository Place 1 suppository (10 mg total) rectally daily as needed for moderate constipation. 01/28/17   Theodis Blaze, MD  ferrous sulfate 325 (65 FE) MG tablet Take 1 tablet (325 mg total) by mouth daily with breakfast. Patient not taking: Reported on 03/11/2017 01/29/17   Theodis Blaze, MD  Nutritional Supplements (NUTRITIONAL SUPPLEMENT PO) HSG Regular diet -  HSG Dysphagia Ground texture, regular consistency    [provider]  polyethylene glycol (MIRALAX / GLYCOLAX) packet Take 17 g by mouth daily as needed for mild constipation. 01/28/17   Theodis Blaze, MD  sulfamethoxazole-trimethoprim (BACTRIM DS,SEPTRA DS) 800-160 MG tablet Take 1 tablet by mouth every 12 (twelve) hours. Patient not taking: Reported on 03/11/2017 01/28/17   Theodis Blaze, MD  Vitamin D, Ergocalciferol, (DRISDOL) 50000 UNITS CAPS capsule Take 50,000 Units by mouth every 7 (seven) days. On Wednesday    [provider]   Allergies  Allergen Reactions  . Penicillins Other (See Comments)    Pt family and pt does not know the reaction. Per records, she has tolerated Rocephin.   Review of Systems  Physical Exam  Vital Signs: BP (!) 137/46   Pulse 68   Temp 98.8 F (37.1 C) (Oral)   Resp 20   Ht 5' 6" (1.676 m)   Wt 47.6 kg (105 lb)   SpO2 100%   BMI 16.95 kg/m  Pain Assessment: CPOT       SpO2: SpO2: 100 % O2 Device:SpO2: 100 % O2 Flow Rate: .O2 Flow Rate (L/min): 2 L/min  IO: Intake/output summary:  Intake/Output Summary (Last 24 hours) at  03/12/17 1636 Last data filed at 03/12/17 1541  Gross per 24 hour  Intake          2285.84 ml  Output              295 ml  Net          1990.84 ml    LBM: Last BM Date: 03/12/17 Baseline Weight: Weight: 47.6 kg (105  lb) Most recent weight: Weight: 47.6 kg (105 lb)     Palliative Assessment/Data:     Thank you for this consult. Palliative medicine will continue to follow and assist as needed.   Time In: 1530  Time Out: 1730 Time Total:120 minutes Prolonged services billed: Yes Greater than 50%  of this time was spent counseling and coordinating care related to the above assessment and plan.  Signed by: Mariana Kaufman, AGNP-C Palliative Medicine    Please contact Palliative Medicine Team phone at [phone] for questions and concerns.  For individual provider: See Shea Evans

## 2017-03-12 NOTE — Procedures (Signed)
ELECTROENCEPHALOGRAM REPORT  Date of Study: 03/12/2017  Patient's Name: Kathleen [name] MRN: [id number] Date of Birth: [birth year]  Referring Provider: Dr. Ivor Costa  Clinical History: This is an 83 year old woman with altered mental status.  Medications: levETIRAcetam (KEPPRA) 500 mg in sodium chloride 0.9 % 100 mL IVPB  aspirin suppository 300 mg  hydrALAZINE (APRESOLINE) injection 5 mg  hydrOXYzine (VISTARIL) injection 25 mg  LORazepam (ATIVAN) injection 1 mg  meropenem (MERREM) 1 g in sodium chloride 0.9 % 100 mL IVPB  morphine 4 MG/ML injection 1 mg   Technical Summary: A multichannel digital EEG recording measured by the international 10-20 system with electrodes applied with paste and impedances below 5000 ohms performed as portable with EKG monitoring in an unresponsive patient.  Hyperventilation and photic stimulation were not performed.  The digital EEG was referentially recorded, reformatted, and digitally filtered in a variety of bipolar and referential montages for optimal display.   Description: The patient is unresponsive during the recording.  There is no clear posterior dominant rhythm. The background consists of a large amount of diffuse low voltage theta and delta slowing with bursts of alpha activity intermixed with brief periods of diffuse background suppression lasting 1-5 seconds. Normal sleep architecture is not seen. No reactivity to noxious stimulation. Hyperventilation and photic stimulation were not performed.  There were no epileptiform discharges or electrographic seizures seen.    EKG lead was unremarkable.  Impression: This EEG is abnormal due to moderate diffuse low voltage background slowing with periods of diffuse suppression.  Clinical Correlation of the above findings indicates diffuse cerebral dysfunction that is non-specific in etiology and can be seen with hypoxic/ischemic injury, toxic/metabolic encephalopathies, or medication effect. No  electrographic seizures in this study. The absence of epileptiform discharges does not rule out a clinical diagnosis of epilepsy.  Clinical correlation is advised.   Ellouise Newer, M.D.

## 2017-03-12 NOTE — Progress Notes (Signed)
EEG completed; results pending.    

## 2017-03-12 NOTE — Progress Notes (Signed)
New orders received and initiated from [facility], NP.

## 2017-03-12 NOTE — Progress Notes (Addendum)
Pt  Bps very low. Notified Provider on call and orders given  For two, 500 ml bolus. Pt has received bolus and BP currently 81/31. Pt only responsive to painful stimuli.   0400 am- Pt Blood pressures critically low. BP 82/37. MAP consistently in the 50s. Notified Provider on call as well as Neurology regarding low BPs. Given orders for STAT & q4 BMP lab draws.  -Pt sodium level is critically high- Providers hesitant to give more fluid bolus r/t cerebral edema.  8280 am- Notified provider regarding continued  low BPS and critical sodium level- 169.        Also made provider aware that Pt has been very difficult lab draw r/t dehydration and low BP,                  recommended PICC due to increased frequency of lab draws.       Provider

## 2017-03-12 NOTE — Progress Notes (Signed)
Initial Nutrition Assessment  DOCUMENTATION CODES:   Severe malnutrition in context of acute illness/injury, Severe malnutrition in context of chronic illness, Underweight  INTERVENTION:  - Diet advancement when/if medically feasible. - RD will continue to monitor for nutrition-related needs based on POC/GOC.  NUTRITION DIAGNOSIS:   Malnutrition (severe) related to acute illness, chronic illness (hx dementia with worsening AMS PTA) as evidenced by severe depletion of muscle mass, severe depletion of body fat.  GOAL:   Patient will meet greater than or equal to 90% of their needs  MONITOR:   Diet advancement, Weight trends, Labs, Skin  REASON FOR ASSESSMENT:   Malnutrition Screening Tool, Consult Assessment of nutrition requirement/status  ASSESSMENT:   83 y.o. female with medical history significant of a.fib, dementia, iron deficiency anemia, psychosis, colon cancer, who presents with AMS. Per her daughter, patient has been confused in the past several days, which has been progressively getting worse. She stopped talking to family members. She moves all extremities, and no facial droop or slurred speech. She also had has decreased oral intake.  Pt was noted to have right hand shaking sometimes. Patient does not have active cough, nausea, vomiting or diarrhea. Per her daughter, patient does not have chest pain or shortness of breath. Not sure if patient has any symptoms of UTI.  Pt seen for MST and consult. BMI indicates underweight status. No family/visitors present and pt unresponsive. RN at bedside states that daughter was in earlier and is likely still somewhere in the hospital as some of her personal belongings are still in the patient's room. Pt being taken to MRI shortly after RD visit. RN reports that daughter takes care of patient on the weekends and pt has a hired caregiver during the week. Daughter had reported that pt was not eating well PTA and that family was focusing on  providing protein and pt was also consuming popsicles (it sounds as though she was mainly provided with items that were very soft or liquid). S/P EEG, abnormal.   Physical assessment shows severe fat wasting to upper arm, mild/moderate muscle wasting to knee area and temporal region, severe muscle wasting to shoulder, clavicle, upper and lower leg areas; mild edema to lower half of bilateral BLE. Per chart review, pt has lost 17 lbs (14% body weight) in the past 1 month. This is significant for time frame. Yet, dx of severe dehydration on admission so unsure how much of this weight loss is attributable to that versus wasting of muscle and fat.   Medications reviewed; 100 mg Solu-cortef x1 dose today, 10 mEq IV KCl x2 runs today. Labs reviewed; Na: 170 mmol/L, Cl: >130 mmol/L, BUN: 100 mg/dL, creatinine: 2.39 mg/dL, GFR: 20 mL/min.  IVF: NS @ 50 mL/hr.    Diet Order:  Diet NPO time specified  Skin:  Wound (see comment) (Stage 2 R ear and Stage 4 sacral pressure injuries)  Last BM:  10/16  Height:   Ht Readings from Last 1 Encounters:  03/11/17 5\' 6"  (1.676 m)    Weight:   Wt Readings from Last 1 Encounters:  03/11/17 105 lb (47.6 kg)    Ideal Body Weight:  59.09 kg  BMI:  Body mass index is 16.95 kg/m.  Estimated Nutritional Needs:   Kcal:  1570-1760 (33-37 kcal/kg)  Protein:  70-85 grams (1.5-1.7 grams/kg)  Fluid:  >/= 1.7 L/day  EDUCATION NEEDS:   No education needs identified at this time    Jarome Matin, MS, RD, LDN, [place] In[facility] Clinical Dietitian  Pager # [phone] After hours/weekend pager # [phone]  

## 2017-03-12 NOTE — Progress Notes (Signed)
Patient ID: Kathleen [name], female   DOB: 1925-07-08, 83 y.o.   MRN: [id number]  PROGRESS NOTE    Kathleen [name]  UMP:[id number] DOB: 10/17/1925 DOA: 03/11/2017  PCP: Jani Gravel, MD   Brief Narrative:  83 year old female with history of atrial fibrillation, not on anticoagulation secondary to dementia, psychosis, colon cancer. Patient presented to ED due to worsening confusion, altered mental status progressively getting worse over past couple of days prior to this admission. Patient stopped talking to family members. Her appetite declined. Family noted that pt had some repetitive motions of her right hand. On admission, temperature was 96 F, HR 106, RR 46, BP 76/41. She was [name] to have a white blood cell count of 16.6, lactic acid 2.2, ammonia level 34. She had mild elevation in troponin level 0.13, creatinine 2.9 and sodium 175, calcium 10.6. Urinalysis showed a few bacteria. Chest x-ray showed hyperinflated lungs, COPD. She was started on empiric meropenem while awaiting culture results.   Assessment & Plan:   Principal Problem:   Acute metabolic  / dementia without behavioral disturbance - Worsening mental status changes likely reflective of underlying dementia as well as sepsis - Patient is receiving meropenem for sepsis - Obtain EEG - Neurology consulted - Started on Keppra due to concern for possible seizure  - Palliative care consulted for goals of care   Active Problems:   Sepsis / leukocytosis - Sepsis criteria met on admission with hypothermia, tachycardia, tachypnea, hypotension, leukocytosis - Lactic acid was 2.20 on admission and now WNL - UA showed few bacteremia  - CXR showed hyperinflated lungs, COPD - Blood cultures and urine culture ordered - Started broad spectrum antibiotic: meropenem    Chronic atrial fibrillation (HCC) - CHA2DS2-VASc Score is at least 3 - HR 87 this am - Not on AC due to dementia    Severe dehydration / Hypercalcemia / Hypernatremia  -  Continue current supportive care with IV fluids     Decubitus ulcer of coccyx, stage IV (HCC) - Per RN care     Severe protein calorie malnutrition ([place]) - Due to AMS she is not eating    Anemia of chronic disease - Hgb stable     Thrombocytopenia - Monitor daily CBC - No reports of bleeding     Elevated troponin - Likely demand ischemia from sepsis and chronic kidney disease - Palliative care consulted for goals of care    AKI (acute kidney injury) ([place]) - Due to severe dehydration - Continue IV fluids - Follow up BMP in am    DVT prophylaxis: SCD's Code Status: DNR/DNI Family Communication: no family at the bedside Disposition Plan: remains in step down unit, more respiratory distress this morning   Consultants:   PCT  Neurology   Procedures:   None   Antimicrobials:   Meropenem 03/11/2017 -->    Subjective: More respiratory distress this morning.  Objective: Vitals:   03/12/17 0700 03/12/17 0730 03/12/17 0800 03/12/17 0900  BP: (!) 118/56 (!) 120/55 (!) 132/59 (!) 113/41  Pulse: (!) 103 (!) 106 (!) 105 87  Resp: (!) 29 (!) 50 (!) 31 (!) 29  Temp:  (!) 100.8 F (38.2 C)    TempSrc:  Oral    SpO2: 90% 92% 92% 98%  Weight:      Height:        Intake/Output Summary (Last 24 hours) at 03/12/17 4008 Last data filed at 03/12/17 0504  Gross per 24 hour  Intake  1651.67 ml  Output              195 ml  Net          1456.67 ml   Filed Weights   03/11/17 1850  Weight: 47.6 kg (105 lb)    Examination:  General exam: Appears ill, malnourished Respiratory system: diminished and coarse breath sounds Cardiovascular system: S1 & S2 heard, tachycardic Gastrointestinal system: Abdomen is nondistended, soft and nontender. No organomegaly or masses felt. Normal bowel sounds heard. Central nervous system: patient is sleeping, responds minimally to verbal stimulation Extremities: no swelling, pulses palpable Skin: No rashes, lesions or  ulcers Psychiatry: not restless or agitated  Data Reviewed: I have personally reviewed following labs and imaging studies  CBC:  Recent Labs Lab 03/11/17 1804 03/12/17 0750  WBC 16.6* 18.4*  NEUTROABS 14.9*  --   HGB 9.8* 9.5*  HCT 34.3* 33.1*  MCV 94.8 95.1  PLT 134* 031*   Basic Metabolic Panel:  Recent Labs Lab 03/11/17 1804 03/11/17 2242 03/12/17 0327 03/12/17 0750  NA 175* 172* 169* 170*  K 3.6 3.4* 4.3 3.8  CL >130* >130* >130* >130*  CO2 25 25 21* 21*  GLUCOSE 164* 134* 123* 120*  BUN 112* 99* 99* 100*  CREATININE 2.90* 2.79* 2.47* 2.39*  CALCIUM 10.6* 10.4* 9.7 10.1   GFR: Estimated Creatinine Clearance: 12 mL/min (A) (by C-G formula based on SCr of 2.39 mg/dL (H)). Liver Function Tests:  Recent Labs Lab 03/11/17 1804  AST 61*  ALT 92*  ALKPHOS 126  BILITOT 1.9*  PROT 6.6  ALBUMIN 1.7*   No results for input(s): LIPASE, AMYLASE in the last 168 hours.  Recent Labs Lab 03/11/17 1804  AMMONIA 34   Coagulation Profile:  Recent Labs Lab 03/11/17 2242  INR 1.65   Cardiac Enzymes:  Recent Labs Lab 03/11/17 2242 03/12/17 0750  TROPONINI 0.15* 0.10*   BNP (last 3 results) No results for input(s): PROBNP in the last 8760 hours. HbA1C:  Recent Labs  03/12/17 0750  HGBA1C 5.8*   CBG:  Recent Labs Lab 03/11/17 1450 03/12/17 0339  GLUCAP 121* 107*   Lipid Profile: No results for input(s): CHOL, HDL, LDLCALC, TRIG, CHOLHDL, LDLDIRECT in the last 72 hours. Thyroid Function Tests: No results for input(s): TSH, T4TOTAL, FREET4, T3FREE, THYROIDAB in the last 72 hours. Anemia Panel: No results for input(s): VITAMINB12, FOLATE, FERRITIN, TIBC, IRON, RETICCTPCT in the last 72 hours. Urine analysis:    Component Value Date/Time   COLORURINE AMBER (A) 03/12/2017 0145   APPEARANCEUR CLOUDY (A) 03/12/2017 0145   LABSPEC 1.020 03/12/2017 0145   PHURINE 5.0 03/12/2017 0145   GLUCOSEU NEGATIVE 03/12/2017 0145   HGBUR MODERATE (A)  03/12/2017 0145   BILIRUBINUR NEGATIVE 03/12/2017 0145   KETONESUR NEGATIVE 03/12/2017 0145   PROTEINUR 30 (A) 03/12/2017 0145   UROBILINOGEN 0.2 06/12/2013 0632   NITRITE NEGATIVE 03/12/2017 0145   LEUKOCYTESUR NEGATIVE 03/12/2017 0145   Sepsis Labs: _0 (procalcitonin:4,lacticidven:4)   ) Recent Results (from the past 240 hour(s))  Blood Culture (routine x 2)     Status: None (Preliminary result)   Collection Time: 03/11/17  6:04 PM  Result Value Ref Range Status   Specimen Description BLOOD RIGHT ANTECUBITAL  Final   Special Requests   Final    BOTTLES DRAWN AEROBIC AND ANAEROBIC Blood Culture adequate volume Performed at [facility], [place] [address], [place], [place] [address]    Culture PENDING  Incomplete   Report Status PENDING  Incomplete  MRSA PCR Screening     Status: None   Collection Time: 03/12/17  1:50 AM  Result Value Ref Range Status   MRSA by PCR NEGATIVE NEGATIVE Final    Comment:        The GeneXpert MRSA Assay (FDA approved for NASAL specimens only), is one component of a comprehensive MRSA colonization surveillance program. It is not intended to diagnose MRSA infection nor to guide or monitor treatment for MRSA infections.       Radiology Studies: Dg Chest 1 View  Result Date: 03/11/2017 CLINICAL DATA:  Shortness of breath EXAM: CHEST 1 VIEW COMPARISON:  Chest radiograph 01/19/2017 FINDINGS: The lungs are hyperinflated without focal consolidation or pulmonary edema. No pleural effusion or pneumothorax. Normal cardiomediastinal contours. Right shoulder osteoarthrosis. IMPRESSION: Hyperinflated lungs may indicate underlying COPD. No focal airspace disease. Electronically Signed   By: Ulyses Jarred M.D.   On: 03/11/2017 15:13   Ct Head Wo Contrast  Result Date: 03/11/2017 CLINICAL DATA:  Altered mental status. EXAM: CT HEAD WITHOUT CONTRAST TECHNIQUE: Contiguous axial images were obtained from the base of the skull through the vertex  without intravenous contrast. COMPARISON:  CT head dated June 22, 2015. FINDINGS: Brain: No evidence of acute infarction, hemorrhage, hydrocephalus, extra-axial collection or mass lesion/mass effect. Moderate age-related cerebral atrophy with compensatory dilatation of the ventricles, similar to prior study. Vascular: No hyperdense vessel or unexpected calcification. Skull: Negative for fracture or focal lesion. Sinuses/Orbits: The bilateral paranasal sinuses and mastoid air cells are clear. Bilateral pseudophakia. No acute orbital abnormality. Other: None. IMPRESSION: No acute intracranial abnormality.  Stable cerebral atrophy. Electronically Signed   By: Titus Dubin M.D.   On: 03/11/2017 15:27     Scheduled Meds: . aspirin  300 mg Rectal Daily  . mouth rinse  15 mL Mouth Rinse BID   Continuous Infusions: . sodium chloride 50 mL/hr at 03/12/17 0500  . acyclovir    . levETIRAcetam Stopped (03/12/17 0519)  . meropenem (MERREM) IV       LOS: 1 day    Time spent: 25 minutes  Greater than 50% of the time spent on counseling and coordinating the care.   Leisa Lenz, MD [facility] Pager [phone]  If 7PM-7AM, please contact night-coverage [email or web address] Password [email or web address] 03/12/2017, 9:22 AM    Behaviorally she really needs to be seen by palliative care

## 2017-03-12 NOTE — Progress Notes (Signed)
  Echocardiogram 2D Echocardiogram has been performed.  Kathleen [name] 03/12/2017, 8:58 AM

## 2017-03-13 DIAGNOSIS — G9341 Metabolic encephalopathy: Secondary | ICD-10-CM

## 2017-03-13 DIAGNOSIS — Z515 Encounter for palliative care: Secondary | ICD-10-CM

## 2017-03-13 DIAGNOSIS — A419 Sepsis, unspecified organism: Principal | ICD-10-CM

## 2017-03-13 DIAGNOSIS — E43 Unspecified severe protein-calorie malnutrition: Secondary | ICD-10-CM | POA: Insufficient documentation

## 2017-03-13 DIAGNOSIS — Z7189 Other specified counseling: Secondary | ICD-10-CM

## 2017-03-13 DIAGNOSIS — G309 Alzheimer's disease, unspecified: Secondary | ICD-10-CM

## 2017-03-13 DIAGNOSIS — F0281 Dementia in other diseases classified elsewhere with behavioral disturbance: Secondary | ICD-10-CM

## 2017-03-13 LAB — BASIC METABOLIC PANEL
BUN: 99 mg/dL — AB (ref 6–20)
BUN: 106 mg/dL — ABNORMAL HIGH (ref 6–20)
BUN: 97 mg/dL — ABNORMAL HIGH (ref 6–20)
BUN: 89 mg/dL — ABNORMAL HIGH (ref 6–20)
BUN: 90 mg/dL — ABNORMAL HIGH (ref 6–20)
BUN: 86 mg/dL — ABNORMAL HIGH (ref 6–20)
CALCIUM: 10.4 mg/dL — AB (ref 8.9–10.3)
CO2: 21 mmol/L — ABNORMAL LOW (ref 22–32)
CO2: 19 mmol/L — ABNORMAL LOW (ref 22–32)
CO2: 22 mmol/L (ref 22–32)
CO2: 22 mmol/L (ref 22–32)
CO2: 20 mmol/L — ABNORMAL LOW (ref 22–32)
CO2: 20 mmol/L — ABNORMAL LOW (ref 22–32)
Calcium: 9.7 mg/dL (ref 8.9–10.3)
Calcium: 10.6 mg/dL — ABNORMAL HIGH (ref 8.9–10.3)
Calcium: 10.7 mg/dL — ABNORMAL HIGH (ref 8.9–10.3)
Calcium: 10.6 mg/dL — ABNORMAL HIGH (ref 8.9–10.3)
Calcium: 10.5 mg/dL — ABNORMAL HIGH (ref 8.9–10.3)
Chloride: >130 mmol/L (ref 101–111)
Chloride: >130 mmol/L (ref 101–111)
Chloride: >130 mmol/L (ref 101–111)
Chloride: >130 mmol/L (ref 101–111)
Chloride: >130 mmol/L (ref 101–111)
Creatinine, Ser: 2.47 mg/dL — ABNORMAL HIGH (ref 0.44–1.00)
Creatinine, Ser: 2.49 mg/dL — ABNORMAL HIGH (ref 0.44–1.00)
Creatinine, Ser: 2.33 mg/dL — ABNORMAL HIGH (ref 0.44–1.00)
Creatinine, Ser: 2.16 mg/dL — ABNORMAL HIGH (ref 0.44–1.00)
Creatinine, Ser: 2.11 mg/dL — ABNORMAL HIGH (ref 0.44–1.00)
Creatinine, Ser: 2.09 mg/dL — ABNORMAL HIGH (ref 0.44–1.00)
GFR calc Af Amer: 20 mL/min — ABNORMAL LOW (ref >60)
GFR calc Af Amer: 22 mL/min — ABNORMAL LOW (ref >60)
GFR calc Af Amer: 23 mL/min — ABNORMAL LOW (ref >60)
GFR calc Af Amer: 23 mL/min — ABNORMAL LOW (ref >60)
GFR calc non Af Amer: 17 mL/min — ABNORMAL LOW (ref >60)
GFR calc non Af Amer: 19 mL/min — ABNORMAL LOW (ref >60)
GFR calc non Af Amer: 20 mL/min — ABNORMAL LOW (ref >60)
GFR calc non Af Amer: 20 mL/min — ABNORMAL LOW (ref >60)
GFR, EST AFRICAN AMERICAN: 19 mL/min — AB (ref >60)
GFR, EST AFRICAN AMERICAN: 19 mL/min — AB (ref >60)
GFR, EST NON AFRICAN AMERICAN: 16 mL/min — AB (ref >60)
GFR, EST NON AFRICAN AMERICAN: 16 mL/min — AB (ref >60)
GLUCOSE: 123 mg/dL — AB (ref 65–99)
Glucose, Bld: 97 mg/dL (ref 65–99)
Glucose, Bld: 89 mg/dL (ref 65–99)
Glucose, Bld: 132 mg/dL — ABNORMAL HIGH (ref 65–99)
Glucose, Bld: 131 mg/dL — ABNORMAL HIGH (ref 65–99)
Glucose, Bld: 139 mg/dL — ABNORMAL HIGH (ref 65–99)
POTASSIUM: 4.3 mmol/L (ref 3.5–5.1)
Potassium: 4.1 mmol/L (ref 3.5–5.1)
Potassium: 3.8 mmol/L (ref 3.5–5.1)
Potassium: 3.2 mmol/L — ABNORMAL LOW (ref 3.5–5.1)
Potassium: 3.2 mmol/L — ABNORMAL LOW (ref 3.5–5.1)
Potassium: 3.2 mmol/L — ABNORMAL LOW (ref 3.5–5.1)
SODIUM: 169 mmol/L — AB (ref 135–145)
SODIUM: 172 mmol/L — AB (ref 135–145)
Sodium: 171 mmol/L (ref 135–145)
Sodium: 169 mmol/L (ref 135–145)
Sodium: 168 mmol/L (ref 135–145)
Sodium: 167 mmol/L (ref 135–145)

## 2017-03-13 LAB — CBC
HEMATOCRIT: 33.4 % — AB (ref 36.0–46.0)
Hemoglobin: 9.7 g/dL — ABNORMAL LOW (ref 12.0–15.0)
MCH: 27.6 pg (ref 26.0–34.0)
MCHC: 29 g/dL — AB (ref 30.0–36.0)
MCV: 94.9 fL (ref 78.0–100.0)
PLATELETS: 123 10*3/uL — AB (ref 150–400)
RBC: 3.52 MIL/uL — ABNORMAL LOW (ref 3.87–5.11)
RDW: 20.7 % — AB (ref 11.5–15.5)
WBC: 17.7 10*3/uL — AB (ref 4.0–10.5)

## 2017-03-13 LAB — URINE CULTURE: CULTURE: NO GROWTH

## 2017-03-13 LAB — HEPATITIS PANEL, ACUTE
HEP A IGM: NEGATIVE
HEP B C IGM: NEGATIVE
Hepatitis B Surface Ag: NEGATIVE

## 2017-03-13 LAB — GLUCOSE, CAPILLARY: Glucose-Capillary: 112 mg/dL — ABNORMAL HIGH (ref 65–99)

## 2017-03-13 LAB — VANCOMYCIN, RANDOM: Vancomycin Rm: 14

## 2017-03-13 MED ORDER — VANCOMYCIN HCL 1000 MG IV SOLR
INTRAVENOUS | Status: DC
  Filled 2017-03-13: qty 100

## 2017-03-13 MED ORDER — VANCOMYCIN HCL 500 MG IV SOLR
500.0000 mg | INTRAVENOUS | Status: DC
  Administered 2017-03-13: 500 mg via INTRAVENOUS
  Filled 2017-03-13: qty 500

## 2017-03-13 NOTE — Progress Notes (Addendum)
Pharmacy Antibiotic Note  Kathleen [name] is a 83 y.o. female admitted on 03/11/2017 with sepsis/meningitis/infected decubitus.  Pharmacy has been consulted for vancomycin meropenem & acyclovir dosing. Antibiotic dosing reflects indication of rule out meningitis.  Today, 03/13/2017  Day #2 abx  Renal: AKI- improving Scr 2.79-->2.47 --> 2.16 (baseline ~0.8); estimated CrCl ~10-20 ml/min  WBC elevated  Afebrile  LP - not being performed  Random vancomycin level = 14 mcg/mL this am  Plan:  Vancomycin 500mg  IV q48h  Patient noted to be hard stick  Continue Meropenem 1gm IV q12h  Continue Acyclovir 500mg  (10mg /kg) IV q24h  Monitor renal fx closely  F/u cultures to help narrow antibiotic  Daily SCr - if able (patient noted to be a difficult stick)  Height: 5\' 6"  (167.6 cm) Weight: 105 lb (47.6 kg) IBW/kg (Calculated) : 59.3  Temp (24hrs), Avg:98.1 F (36.7 C), Min:97.6 F (36.4 C), Max:98.8 F (37.1 C)   Recent Labs Lab 03/11/17 1641 03/11/17 1804  03/11/17 2243  03/12/17 0750 03/12/17 1202 03/12/17 1636 03/12/17 2029 03/13/17 0206 03/13/17 0827  WBC  --  16.6*  --   --   --  18.4*  --   --   --   --  17.7*  CREATININE  --  2.90*  < >  --   < > 2.39* 2.43* 2.49* 2.39* 2.33* 2.16*  LATICACIDVEN 2.20* 1.4  --  1.5  --   --   --   --   --   --   --   VANCORANDOM  --   --   --   --   --   --   --   --   --   --  14  < > = values in this interval not displayed.  Estimated Creatinine Clearance: 13.3 mL/min (A) (by C-G formula based on SCr of 2.16 mg/dL (H)).    Allergies  Allergen Reactions  . Penicillins Other (See Comments)    Pt family and pt does not know the reaction. Per records, she has tolerated Rocephin.    Antimicrobials this admission:  10/15 vanco >>  10/15 cefepime >> 10/15 10/15 meropenem >> 10/15 acyclovir >>  Dose adjustments this admission: 10/16: Increased Meropenem to q12h, Acyclovir to 10mg /kg for improving renal fxn 10/17 0827 Random  vancomycin = 14 mcg/mL (vanco 1gm given 10/15 at 18:49) Microbiology results: 10/15 BCx: NGTD 10/16 Ucx 10/15 MRSA PCR: negative  Thank you for allowing pharmacy to be a part of this patient's care.  Doreene Eland, PharmD, BCPS.   Pager: [phone] 03/13/2017 9:25 AM

## 2017-03-13 NOTE — Progress Notes (Signed)
Critical labs reported to [facility], NP, new orders received and initiated.

## 2017-03-13 NOTE — Progress Notes (Signed)
Daily Progress Note   Patient Name: Kathleen [name]       Date: 03/13/2017 DOB: [birth year]  Age: 83 y.o. MRN#: [id number] Attending Physician: Rodena Goldmann, DO Primary Care Physician: Jani Gravel, MD Admit Date: 03/11/2017  Reason for Consultation/Follow-up: Establishing goals of care  Subjective: Patient remains unresponsive. No family at bedside. Spoke with patient's daugther, Kathleen [name] via phone. She spoke with patient's attending physician this morning and elicits current GOC are to move patient out of ICU, continue current level of care for "a day or two", but if patient shows no improvement, will transition to comfort care and Hospice.   Review of Systems  Unable to perform ROS: Patient unresponsive    Length of Stay: 2  Current Medications: Scheduled Meds:  . aspirin  300 mg Rectal Daily  . mouth rinse  15 mL Mouth Rinse BID    Continuous Infusions: . acyclovir Stopped (03/12/17 2254)  . dextrose 100 mL/hr at 03/13/17 0957  . levETIRAcetam Stopped (03/13/17 0553)  . meropenem (MERREM) IV Stopped (03/13/17 1030)  . vancomycin 500 mg (03/13/17 1143)    PRN Meds: hydrALAZINE, hydrOXYzine, LORazepam, morphine injection, oxyCODONE  Physical Exam  Constitutional:  cachexic  Pulmonary/Chest:  tachypneic  Neurological:  unresponsive  Skin: Skin is warm and dry.  Nursing note and vitals reviewed.           Vital Signs: BP (!) 160/53   Pulse 68   Temp 97.8 F (36.6 C) (Oral)   Resp (!) 33   Ht 5\' 6"  (1.676 m)   Wt 47.6 kg (105 lb)   SpO2 100%   BMI 16.95 kg/m  SpO2: SpO2: 100 % O2 Device: O2 Device: Nasal Cannula O2 Flow Rate: O2 Flow Rate (L/min): 2 L/min  Intake/output summary:  Intake/Output Summary (Last 24 hours) at 03/13/17 1159 Last data filed at  03/13/17 0900  Gross per 24 hour  Intake          1668.75 ml  Output              365 ml  Net          1303.75 ml   LBM: Last BM Date: 03/12/17 Baseline Weight: Weight: 47.6 kg (105 lb) Most recent weight: Weight: 47.6 kg (105 lb)       Palliative  Assessment/Data: PPS: 10%      Patient Active Problem List   Diagnosis Date Noted  . Protein-calorie malnutrition, severe 03/13/2017  . Hypernatremia 03/11/2017  . Hypercalcemia 03/11/2017  . Elevated troponin 03/11/2017  . AKI (acute kidney injury) ([place]) 03/11/2017  . Abnormal LFTs 03/11/2017  . Normochromic normocytic anemia   . Deep tissue injury 02/10/2017  . Severe malnutrition ([place]) 01/29/2017  . Anemia of chronic disease 01/29/2017  . Psychosis ([place]) 01/29/2017  . Vitamin B 12 deficiency 01/29/2017  . Decubitus ulcer of coccyx, stage IV ([place])   . Encounter for palliative care   . Goals of care, counseling/discussion   . Sepsis ([place]) 01/19/2017  . Hypokalemia 01/19/2017  . Hypoalbuminemia 01/19/2017  . Acute metabolic encephalopathy 27/78/2423  . Atrial fibrillation ([place]) 06/12/2013  . Dementia of Alzheimer's type with behavioral disturbance 06/12/2013  . Severe dehydration 06/12/2013  . History of colon cancer 06/12/2012    Palliative Care Assessment & Plan   Patient Profile: 83 y.o. female  with past medical history of a. Fib, dementia, anemia, colon cancer, alzheimer's, COPD, infected decubitus, malnutrition  admitted on 03/11/2017 with altered mental status.  Workup revealed elevated WBC and lactic acid, significantly elevated serum sodium, CT and MRI negative for acute processes, EEG showed diffuse slowing and dysfunction but no seizure activity. Urine positive for few bacteria, negative for leukocytes- culture pending, blood culture pending. Palliative medicine consulted for [place].  Assessment/Recommendations/Plan   Patient appears to be dying from possible infectious source in the setting of end stage  alzheimer's disease  Cont current level of care with no plans for escalation, if no improvement transition to comfort care  PMT will continue to follow and provide support and assistance with transition to comfort care when needed  Code Status:  DNR  Prognosis:   < 2 weeks d/t end stage stage alzheimer's disease, unresponsive, no po intake, infectious process (?meningitis, uti, sacral decubitis), severe electrolyte derangement  Discharge Planning:  To Be Determined  Care plan was discussed with patient's daughter- Kathleen [name]  Thank you for allowing the Palliative Medicine Team to assist in the care of this patient.   Time In: 1140 Time Out: 1205 Total Time 25 mins Prolonged Time Billed no      Greater than 50%  of this time was spent counseling and coordinating care related to the above assessment and plan.  Mariana Kaufman, AGNP-C Palliative Medicine   Please contact Palliative Medicine Team phone at [phone] for questions and concerns.

## 2017-03-13 NOTE — Progress Notes (Signed)
Patient ID: Kathleen [name], female   DOB: 09-10-1925, 83 y.o.   MRN: [id number]  PROGRESS NOTE    ALFRED ECKLEY  QBH:[id number] DOB: 02-09-1926 DOA: 03/11/2017  PCP: Jani Gravel, MD   Brief Narrative:  83 year old female with history of atrial fibrillation, not on anticoagulation secondary to dementia, psychosis, colon cancer. Patient presented to ED due to worsening confusion, altered mental status progressively getting worse over past couple of days prior to this admission. Patient stopped talking to family members. Her appetite declined. Family noted that pt had some repetitive motions of her right hand. On admission, temperature was 96 F, HR 106, RR 46, BP 76/41. She was [name] to have a white blood cell count of 16.6, lactic acid 2.2, ammonia level 34. She had mild elevation in troponin level 0.13, creatinine 2.9 and sodium 175, calcium 10.6. Urinalysis showed a few bacteria. Chest x-ray showed hyperinflated lungs, COPD. She was started on empiric meropenem while awaiting culture results.  In the interim, patient has had a brain MRI as well as EEG demonstrating progressive atrophy that is generalized as well as moderate diffuse slowing with occasional suppressed pattern as noted and severe encephalopathy with no active seizures, respectively. I spoke with Dr. Rory Percy of neurology this morning who feels that this is end stage and that the patient should be considered for hospice care. Daughter at the bedside refuses any aggressive measures including a lumbar puncture. No acute overnight events noted. Patient's mentation does not appear to be improving.  Assessment & Plan:   Principal Problem:   Acute metabolic  / dementia without behavioral disturbance - Worsening mental status changes likely reflective of underlying dementia as well as sepsis with possible meningitis and hypernatremia -Continue current antibiotic treatments; spoke to pharmacy about changing antibiotics to D5 water which will be  possible with Vancomycin, but not the others -continue D5 water at current rate with good improvement noted in sodium levels -Consider home hospice in the next 2-3 days if no improvement noted; daughter voices understanding  Active Problems:   Sepsis / leukocytosis-stable - Likely secondary to above with noted possible meningitis -Continue current antibiotics and await final cultures    Chronic atrial fibrillation (HCC) - CHA2DS2-VASc Score is at least 3 - HR 87 this am - Not on AC due to dementia    Severe dehydration / Hypercalcemia / Hypernatremia  - Continue current supportive care with IV fluids at current rate with D5W    Decubitus ulcer of coccyx, stage IV (HCC) - Per RN care     Severe protein calorie malnutrition ([place]) - Due to AMS she is not eating -No PEG or NG tube desired even for hypernatremia tx    Anemia of chronic disease - Hgb stable     Thrombocytopenia-stable - Monitor daily CBC - No reports of bleeding     Elevated troponin-NSTEMI II - Likely demand ischemia from sepsis and chronic kidney disease - Palliative care consulted for goals of care    AKI (acute kidney injury) (HCC)-slowly improving - Due to severe dehydration - Continue IV fluids - Follow up BMP in am   DVT prophylaxis: SCD's Code Status: DNR/DNI Family Communication: With daughter at bedsie Disposition Plan: To floor today, since no aggressive measures warranted; will try to see if mentation improves with correction of serum sodium as well as possible meningitis in the next 2-3 days, otherwise consider home hospice.   Consultants:   PCT  Neurology   Procedures:   None  Antimicrobials:   Meropenem 03/11/2017 -->   Vancomycin 03/12/2017->  Acyclovir 03/12/2017->   Objective: Vitals:   03/13/17 0321 03/13/17 0400 03/13/17 0800 03/13/17 1200  BP:  (!) 160/53    Pulse:      Resp:  (!) 33    Temp: 98.1 F (36.7 C)  97.8 F (36.6 C) 98.8 F (37.1 C)  TempSrc:  Axillary  Oral Oral  SpO2:   100%   Weight:      Height:        Intake/Output Summary (Last 24 hours) at 03/13/17 1208 Last data filed at 03/13/17 0900  Gross per 24 hour  Intake          1618.75 ml  Output              365 ml  Net          1253.75 ml   Filed Weights   03/11/17 1850  Weight: 47.6 kg (105 lb)    Examination:  General exam: Appears ill, malnourished on [place] Respiratory system: diminished and coarse breath sounds Cardiovascular system: S1 & S2 heard, tachycardic Gastrointestinal system: Abdomen is nondistended, soft and nontender. No organomegaly or masses felt. Normal bowel sounds heard. Central nervous system: patient is sleeping, responds minimally to verbal stimulation Extremities: no swelling, pulses palpable Skin: No rashes, lesions or ulcers Psychiatry: not restless or agitated  Data Reviewed: I have personally reviewed following labs and imaging studies  CBC:  Recent Labs Lab 03/11/17 1804 03/12/17 0750 03/13/17 0827  WBC 16.6* 18.4* 17.7*  NEUTROABS 14.9*  --   --   HGB 9.8* 9.5* 9.7*  HCT 34.3* 33.1* 33.4*  MCV 94.8 95.1 94.9  PLT 134* 129* 338*   Basic Metabolic Panel:  Recent Labs Lab 03/12/17 1636 03/12/17 2029 03/13/17 0206 03/13/17 0827 03/13/17 1026  NA 172* 173* 171* 169* 168*  K 4.1 3.8 3.8 3.2* 3.2*  CL >130* >130* >130* >130* >130*  CO2 19* 21* 22 22 20*  GLUCOSE 97 94 89 132* 131*  BUN 106* 104* 97* 89* 90*  CREATININE 2.49* 2.39* 2.33* 2.16* 2.11*  CALCIUM 10.4* 10.6* 10.6* 10.7* 10.6*   GFR: Estimated Creatinine Clearance: 13.6 mL/min (A) (by C-G formula based on SCr of 2.11 mg/dL (H)). Liver Function Tests:  Recent Labs Lab 03/11/17 1804  AST 61*  ALT 92*  ALKPHOS 126  BILITOT 1.9*  PROT 6.6  ALBUMIN 1.7*   No results for input(s): LIPASE, AMYLASE in the last 168 hours.  Recent Labs Lab 03/11/17 1804  AMMONIA 34   Coagulation Profile:  Recent Labs Lab 03/11/17 2242  INR 1.65   Cardiac  Enzymes:  Recent Labs Lab 03/11/17 2242 03/12/17 0750  TROPONINI 0.15* 0.10*   BNP (last 3 results) No results for input(s): PROBNP in the last 8760 hours. HbA1C:  Recent Labs  03/12/17 0750  HGBA1C 5.8*   CBG:  Recent Labs Lab 03/11/17 1450 03/12/17 0339 03/13/17 0822  GLUCAP 121* 107* 112*   Lipid Profile:  Recent Labs  03/12/17 0750  CHOL 174  HDL 23*  LDLCALC 123*  TRIG 138  CHOLHDL 7.6   Thyroid Function Tests: No results for input(s): TSH, T4TOTAL, FREET4, T3FREE, THYROIDAB in the last 72 hours. Anemia Panel: No results for input(s): VITAMINB12, FOLATE, FERRITIN, TIBC, IRON, RETICCTPCT in the last 72 hours. Urine analysis:    Component Value Date/Time   COLORURINE AMBER (A) 03/12/2017 0145   APPEARANCEUR CLOUDY (A) 03/12/2017 0145   LABSPEC 1.020 03/12/2017 0145  PHURINE 5.0 03/12/2017 0145   GLUCOSEU NEGATIVE 03/12/2017 0145   HGBUR MODERATE (A) 03/12/2017 0145   BILIRUBINUR NEGATIVE 03/12/2017 0145   KETONESUR NEGATIVE 03/12/2017 0145   PROTEINUR 30 (A) 03/12/2017 0145   UROBILINOGEN 0.2 06/12/2013 0632   NITRITE NEGATIVE 03/12/2017 0145   LEUKOCYTESUR NEGATIVE 03/12/2017 0145   Sepsis Labs: @LABRCNTIP (procalcitonin:4,lacticidven:4)   ) Recent Results (from the past 240 hour(s))  Blood Culture (routine x 2)     Status: None (Preliminary result)   Collection Time: 03/11/17  6:04 PM  Result Value Ref Range Status   Specimen Description BLOOD RIGHT ANTECUBITAL  Final   Special Requests   Final    BOTTLES DRAWN AEROBIC AND ANAEROBIC Blood Culture adequate volume   Culture   Final    NO GROWTH < 24 HOURS Performed at [facility], [place] [address] [place], [place] [address]    Report Status PENDING  Incomplete  MRSA PCR Screening     Status: None   Collection Time: 03/12/17  1:50 AM  Result Value Ref Range Status   MRSA by PCR NEGATIVE NEGATIVE Final    Comment:        The GeneXpert MRSA Assay (FDA approved for NASAL  specimens only), is one component of a comprehensive MRSA colonization surveillance program. It is not intended to diagnose MRSA infection nor to guide or monitor treatment for MRSA infections.   Urine Culture     Status: None   Collection Time: 03/12/17  2:20 AM  Result Value Ref Range Status   Specimen Description URINE, RANDOM  Final   Special Requests NONE  Final   Culture   Final    NO GROWTH Performed at [facility], [address], [place], [place] [address]    Report Status 03/13/2017 FINAL  Final      Radiology Studies: Dg Chest 1 View  Result Date: 03/11/2017 CLINICAL DATA:  Shortness of breath EXAM: CHEST 1 VIEW COMPARISON:  Chest radiograph 01/19/2017 FINDINGS: The lungs are hyperinflated without focal consolidation or pulmonary edema. No pleural effusion or pneumothorax. Normal cardiomediastinal contours. Right shoulder osteoarthrosis. IMPRESSION: Hyperinflated lungs may indicate underlying COPD. No focal airspace disease. Electronically Signed   By: Ulyses Jarred M.D.   On: 03/11/2017 15:13   Ct Head Wo Contrast  Result Date: 03/11/2017 CLINICAL DATA:  Altered mental status. EXAM: CT HEAD WITHOUT CONTRAST TECHNIQUE: Contiguous axial images were obtained from the base of the skull through the vertex without intravenous contrast. COMPARISON:  CT head dated June 22, 2015. FINDINGS: Brain: No evidence of acute infarction, hemorrhage, hydrocephalus, extra-axial collection or mass lesion/mass effect. Moderate age-related cerebral atrophy with compensatory dilatation of the ventricles, similar to prior study. Vascular: No hyperdense vessel or unexpected calcification. Skull: Negative for fracture or focal lesion. Sinuses/Orbits: The bilateral paranasal sinuses and mastoid air cells are clear. Bilateral pseudophakia. No acute orbital abnormality. Other: None. IMPRESSION: No acute intracranial abnormality.  Stable cerebral atrophy. Electronically Signed   By: Titus Dubin M.D.   On: 03/11/2017 15:27   Mr Brain Wo Contrast  Result Date: 03/12/2017 CLINICAL DATA:  83 year old female with sepsis. Altered mental status. Possible right upper extremity focal seizure. EXAM: MRI HEAD WITHOUT CONTRAST TECHNIQUE: Multiplanar, multiecho pulse sequences of the brain and surrounding structures were obtained without intravenous contrast. COMPARISON:  Head CT 03/11/2017 and earlier. FINDINGS: Study is mildly degraded by motion artifact despite repeated imaging attempts. Brain: Chronic ventricular prominence is stable since 2017, and includes enlargement  of the temporal horns. No evidence of hyperdynamic flow at the cerebral aqueduct. No transependymal edema suspected. No restricted diffusion to suggest acute infarction. No midline shift, mass effect, evidence of mass lesion, extra-axial collection or acute intracranial hemorrhage. Cervicomedullary junction and pituitary are within normal limits. Mild periventricular white matter T2 and FLAIR hyperintensity. No definite cortical encephalomalacia or chronic cerebral blood products. Preserved signal in the deep gray matter nuclei, brainstem, and cerebellum. There is bilateral hippocampal atrophy which does appear disproportionate, although the degree of bilateral temporal lobe atrophy otherwise appears fairly proportional to the remaining brain. Vascular: Major intracranial vascular flow voids are grossly preserved. Skull and upper cervical spine: Negative. Normal bone marrow signal. Sinuses/Orbits: Postoperative changes to the globes. Otherwise negative orbits soft tissues. Visualized paranasal sinuses and mastoids are stable and well pneumatized. Other: Visible internal auditory structures appear normal. Scalp and face soft tissues appear stable and negative. IMPRESSION: 1. No definite acute intracranial abnormality. 2. Chronic ventriculomegaly has progressed since the earliest 2014 comparisons but is stable since 2017 and favored to be  ex vacuo in nature. Electronically Signed   By: Genevie Ann M.D.   On: 03/12/2017 15:11     Scheduled Meds: . aspirin  300 mg Rectal Daily  . mouth rinse  15 mL Mouth Rinse BID   Continuous Infusions: . acyclovir Stopped (03/12/17 2254)  . dextrose 100 mL/hr at 03/13/17 0957  . meropenem (MERREM) IV Stopped (03/13/17 1030)  . vancomycin 500 mg (03/13/17 1143)     LOS: 2 days    Time spent: 25 minutes  Greater than 50% of the time spent on counseling and coordinating the care.   Ellias Mcelreath Darleen Crocker, DO [facility] Pager [phone]  If 7PM-7AM, please contact night-coverage [email or web address] Password [facility] 03/13/2017, 12:08 PM    Behaviorally she really needs to be seen by palliative care

## 2017-03-13 NOTE — Progress Notes (Signed)
Chart review note  MRI shows no stroke. Shows progressed atrophy that is generalized.  EEG showed moderate diffuse slowing with occasional suppressed pattern as seen with severe encephalopathy. No seizures seen electrographically.  Continues to have multiple metabolic derangements, which are being corrected by primary team.  Family per palliative note is not willing to get a LP to evaluate for Meningitis, which was on the differential per Dr. Cheral Marker.  At this time, we do not have any new recommendations other than those already provided in the initial consultation.  We will be available as needed.   Spoke with primary hospitalist Dr. Manuella Ghazi over the phone and updated him with recs.  -- Amie Portland, MD [facility] [phone]  If 7pm to 7am, please call on call as listed on AMION.

## 2017-03-14 LAB — CBC
HCT: 31.6 % — ABNORMAL LOW (ref 36.0–46.0)
HEMOGLOBIN: 9.2 g/dL — AB (ref 12.0–15.0)
MCH: 27.3 pg (ref 26.0–34.0)
MCHC: 29.1 g/dL — ABNORMAL LOW (ref 30.0–36.0)
MCV: 93.8 fL (ref 78.0–100.0)
PLATELETS: 95 10*3/uL — AB (ref 150–400)
RBC: 3.37 MIL/uL — AB (ref 3.87–5.11)
RDW: 20.3 % — ABNORMAL HIGH (ref 11.5–15.5)
WBC: 15.5 10*3/uL — AB (ref 4.0–10.5)

## 2017-03-14 LAB — BASIC METABOLIC PANEL
BUN: 69 mg/dL — ABNORMAL HIGH (ref 6–20)
CO2: 21 mmol/L — ABNORMAL LOW (ref 22–32)
Calcium: 10.2 mg/dL (ref 8.9–10.3)
Creatinine, Ser: 1.74 mg/dL — ABNORMAL HIGH (ref 0.44–1.00)
GFR, EST AFRICAN AMERICAN: 29 mL/min — AB (ref >60)
GFR, EST NON AFRICAN AMERICAN: 25 mL/min — AB (ref >60)
Glucose, Bld: 134 mg/dL — ABNORMAL HIGH (ref 65–99)
POTASSIUM: 2.9 mmol/L — AB (ref 3.5–5.1)
SODIUM: 163 mmol/L — AB (ref 135–145)

## 2017-03-14 LAB — MAGNESIUM: MAGNESIUM: 1.8 mg/dL (ref 1.7–2.4)

## 2017-03-14 MED ORDER — POTASSIUM CHLORIDE 10 MEQ/100ML IV SOLN
10.0000 meq | INTRAVENOUS | Status: AC
  Administered 2017-03-14 (×6): 10 meq via INTRAVENOUS
  Filled 2017-03-14 (×6): qty 100

## 2017-03-14 NOTE — Progress Notes (Addendum)
Daily Progress Note   Patient Name: Kathleen [name]       Date: 03/14/2017 DOB: 07-09-1925  Age: 83 y.o. MRN#: [id number] Attending Physician: Rodena Goldmann, DO Primary Care Physician: Jani Gravel, MD Admit Date: 03/11/2017  Reason for Consultation/Follow-up: Establishing goals of care  Subjective: Per nursing report patient opened her eyes and smiled this morning. Upon my assessment she lifts her eyebrows, but does not open her eyes.  No family at bedside. Attempted to contact patient's daughter by phone, but unable to reach her.   Review of Systems  Unable to perform ROS: Patient unresponsive    Length of Stay: 3  Current Medications: Scheduled Meds:  . aspirin  300 mg Rectal Daily  . mouth rinse  15 mL Mouth Rinse BID    Continuous Infusions: . acyclovir Stopped (03/13/17 2306)  . [START ON 03/15/2017] dextrose 5 % 100 mL with vancomycin (VANCOCIN) 500 mg infusion    . dextrose 100 mL/hr at 03/14/17 0556  . meropenem (MERREM) IV Stopped (03/14/17 1132)  . potassium chloride 10 mEq (03/14/17 1310)    PRN Meds: hydrALAZINE, hydrOXYzine, LORazepam, morphine injection, oxyCODONE  Physical Exam  Constitutional:  cachexic  Pulmonary/Chest:  tachypneic  Neurological:  unresponsive  Skin: Skin is warm and dry.  Nursing note and vitals reviewed.           Vital Signs: BP (!) 155/59 (BP Location: Right Leg)   Pulse (!) 103   Temp 98.1 F (36.7 C) (Axillary)   Resp 18   Ht 5\' 6"  (1.676 m)   Wt 47.6 kg (105 lb)   SpO2 100%   BMI 16.95 kg/m  SpO2: SpO2: 100 % O2 Device: O2 Device: Nasal Cannula O2 Flow Rate: O2 Flow Rate (L/min): 2 L/min  Intake/output summary:   Intake/Output Summary (Last 24 hours) at 03/14/17 1311 Last data filed at 03/14/17 1020  Gross per 24  hour  Intake          2171.67 ml  Output              470 ml  Net          1701.67 ml   LBM: Last BM Date: 03/12/17 Baseline Weight: Weight: 47.6 kg (105 lb) Most recent weight: Weight: 47.6 kg (105 lb)  Palliative Assessment/Data: PPS: 10%      Patient Active Problem List   Diagnosis Date Noted  . Protein-calorie malnutrition, severe 03/13/2017  . Advance care planning   . Palliative care by specialist   . Hypernatremia 03/11/2017  . Hypercalcemia 03/11/2017  . Elevated troponin 03/11/2017  . AKI (acute kidney injury) ([place]) 03/11/2017  . Abnormal LFTs 03/11/2017  . Normochromic normocytic anemia   . Deep tissue injury 02/10/2017  . Severe malnutrition ([place]) 01/29/2017  . Anemia of chronic disease 01/29/2017  . Psychosis ([place]) 01/29/2017  . Vitamin B 12 deficiency 01/29/2017  . Decubitus ulcer of coccyx, stage IV ([place])   . Encounter for palliative care   . Goals of care, counseling/discussion   . Sepsis ([place]) 01/19/2017  . Hypokalemia 01/19/2017  . Hypoalbuminemia 01/19/2017  . Acute metabolic encephalopathy 42/70/6237  . Atrial fibrillation ([place]) 06/12/2013  . Dementia of Alzheimer's type with behavioral disturbance 06/12/2013  . Severe dehydration 06/12/2013  . History of colon cancer 06/12/2012    Palliative Care Assessment & Plan   Patient Profile: 83 y.o. female  with past medical history of a. Fib, dementia, anemia, colon cancer, alzheimer's, COPD, infected decubitus, malnutrition  admitted on 03/11/2017 with altered mental status.  Workup revealed elevated WBC and lactic acid, significantly elevated serum sodium, CT and MRI negative for acute processes, EEG showed diffuse slowing and dysfunction but no seizure activity. Urine positive for few bacteria, negative for leukocytes- culture pending, blood cultures negative to date. Palliative medicine consulted for [place].  Assessment/Recommendations/Plan   Patient appears to be dying- even with electrolyte  correction- her dementia is likely advanced to the point that she will quickly deteriorate due to no po intake  Cont current level of care with no plans for escalation,PMT will continue to follow and provide support and assistance with transition to comfort measures when and if Kathleen [name] agrees  Code Status:  DNR  Prognosis:   < 2 weeks d/t end stage stage alzheimer's disease, unresponsive, no po intake, infectious process (?meningitis, uti, sacral decubitis), severe electrolyte derangement  Discharge Planning:  To Be Determined  Care plan was discussed with patient's daughter- Kathleen [name]  Thank you for allowing the Palliative Medicine Team to assist in the care of this patient.   Time In: 1300 Time Out: 1315 Total Time 15 mins Prolonged Time Billed no      Greater than 50%  of this time was spent counseling and coordinating care related to the above assessment and plan.  Mariana Kaufman, AGNP-C Palliative Medicine   Please contact Palliative Medicine Team phone at [phone] for questions and concerns.

## 2017-03-14 NOTE — Progress Notes (Signed)
Critical Value:  Date & Time Notified: 03/14/2017 at 0626  Provider Notified: K.Schorr  Orders Received/Actions Taken: Provider to assess and address.

## 2017-03-14 NOTE — Progress Notes (Signed)
Patient ID: Kathleen [name], female   DOB: 1925/12/20, 83 y.o.   MRN: [id number]  PROGRESS NOTE    Kathleen [name]  GBT:[id number] DOB: [birth year] DOA: 03/11/2017  PCP: Jani Gravel, MD   Brief Narrative:  83 year old female with history of atrial fibrillation, not on anticoagulation secondary to dementia, psychosis, colon cancer. Patient presented to ED due to worsening confusion, altered mental status progressively getting worse over past couple of days prior to this admission. Patient stopped talking to family members. Her appetite declined. Family noted that pt had some repetitive motions of her right hand. On admission, temperature was 96 F, HR 106, RR 46, BP 76/41. She was [name] to have a white blood cell count of 16.6, lactic acid 2.2, ammonia level 34. She had mild elevation in troponin level 0.13, creatinine 2.9 and sodium 175, calcium 10.6. Urinalysis showed a few bacteria. Chest x-ray showed hyperinflated lungs, COPD. She was started on empiric meropenem while awaiting culture results.  In the interim, patient has had a brain MRI as well as EEG demonstrating progressive atrophy that is generalized as well as moderate diffuse slowing with occasional suppressed pattern as noted and severe encephalopathy with no active seizures, respectively. I spoke with Dr. Rory Percy of neurology on 10/17 who feels that this is end stage and that the patient should be considered for hospice care. Daughter at the bedside refuses any aggressive measures including a lumbar puncture. No acute overnight events noted. Patient's mentation does not appear to be improving despite improvements in serum sodium. Pt now off droplet precautions.  Assessment & Plan:   Principal Problem:   Acute metabolic  / dementia without behavioral disturbance - Worsening mental status changes likely reflective of underlying dementia as well as sepsis with possible meningitis and hypernatremia -Continue current antibiotic treatments; spoke to  pharmacy about changing antibiotics to D5 water which will be possible with Vancomycin, but not the others -continue D5 water at current rate with good improvement noted in sodium levels -Consider home hospice in the next 1-2 days if no improvement noted; daughter voices understanding on 10/17  Active Problems:   Sepsis / leukocytosis-stable - Likely secondary to above with noted possible meningitis -Continue current antibiotics and await final cultures; which are currently negative    Chronic atrial fibrillation (HCC) - CHA2DS2-VASc Score is at least 3 - HR 87 this am - Not on AC due to dementia    Severe dehydration / Hypercalcemia / Hypernatremia  - Continue current supportive care with IV fluids at current rate with D5W    Decubitus ulcer of coccyx, stage IV ([place]) - Per RN care     Severe protein calorie malnutrition ([place]) - Due to AMS she is not eating -No PEG or NG tube desired even for hypernatremia tx    Anemia of chronic disease - Hgb stable     Thrombocytopenia-stable - Monitor daily CBC - No reports of bleeding     Elevated troponin-NSTEMI II - Likely demand ischemia from sepsis and chronic kidney disease - Palliative care consulted for goals of care    AKI (acute kidney injury) (HCC)-slowly improving - Due to severe dehydration - Continue IV fluids - Follow up BMP in am   DVT prophylaxis: SCD's Code Status: DNR/DNI Family Communication: With daughter at bedsie Disposition Plan: To floor today, since no aggressive measures warranted; will try to see if mentation improves with correction of serum sodium as well as possible meningitis in the next 2-3 days, otherwise consider home hospice.  Consultants:   PCT  Neurology   Procedures:   None   Antimicrobials:   Meropenem 03/11/2017 -->   Vancomycin 03/12/2017->  Acyclovir 03/12/2017->   Objective: Vitals:   03/13/17 1200 03/13/17 1300 03/13/17 1832 03/14/17 0453  BP: (!) 155/48  (!) 131/57  (!) 155/59  Pulse:   97 (!) 103  Resp: (!) 35 (!) 33 20 18  Temp: 98.8 F (37.1 C)  98.6 F (37 C) 98.1 F (36.7 C)  TempSrc: Oral  Oral Axillary  SpO2: 100%  98% 100%  Weight:      Height:        Intake/Output Summary (Last 24 hours) at 03/14/17 1124 Last data filed at 03/14/17 1020  Gross per 24 hour  Intake          2171.67 ml  Output              500 ml  Net          1671.67 ml   Filed Weights   03/11/17 1850  Weight: 47.6 kg (105 lb)    Examination:  General exam: Appears ill, malnourished on [place]; unresponsive Respiratory system: diminished and coarse breath sounds Cardiovascular system: S1 & S2 heard, tachycardic Gastrointestinal system: Abdomen is nondistended, soft and nontender. No organomegaly or masses felt. Normal bowel sounds heard. Central nervous system: patient is sleeping, responds minimally to verbal stimulation Extremities: no swelling, pulses palpable Skin: No rashes, lesions or ulcers Psychiatry: cannot be evaluated  Data Reviewed: I have personally reviewed following labs and imaging studies  CBC:  Recent Labs Lab 03/11/17 1804 03/12/17 0750 03/13/17 0827 03/14/17 0426  WBC 16.6* 18.4* 17.7* 15.5*  NEUTROABS 14.9*  --   --   --   HGB 9.8* 9.5* 9.7* 9.2*  HCT 34.3* 33.1* 33.4* 31.6*  MCV 94.8 95.1 94.9 93.8  PLT 134* 129* 123* 95*   Basic Metabolic Panel:  Recent Labs Lab 03/13/17 0206 03/13/17 0827 03/13/17 1026 03/13/17 1343 03/14/17 0426  NA 171* 169* 168* 167* 163*  K 3.8 3.2* 3.2* 3.2* 2.9*  CL >130* >130* >130* >130* >130*  CO2 22 22 20* 20* 21*  GLUCOSE 89 132* 131* 139* 134*  BUN 97* 89* 90* 86* 69*  CREATININE 2.33* 2.16* 2.11* 2.09* 1.74*  CALCIUM 10.6* 10.7* 10.6* 10.5* 10.2  MG  --   --   --   --  1.8   GFR: Estimated Creatinine Clearance: 16.5 mL/min (A) (by C-G formula based on SCr of 1.74 mg/dL (H)). Liver Function Tests:  Recent Labs Lab 03/11/17 1804  AST 61*  ALT 92*  ALKPHOS 126  BILITOT 1.9*    PROT 6.6  ALBUMIN 1.7*   No results for input(s): LIPASE, AMYLASE in the last 168 hours.  Recent Labs Lab 03/11/17 1804  AMMONIA 34   Coagulation Profile:  Recent Labs Lab 03/11/17 2242  INR 1.65   Cardiac Enzymes:  Recent Labs Lab 03/11/17 2242 03/12/17 0750  TROPONINI 0.15* 0.10*   BNP (last 3 results) No results for input(s): PROBNP in the last 8760 hours. HbA1C:  Recent Labs  03/12/17 0750  HGBA1C 5.8*   CBG:  Recent Labs Lab 03/11/17 1450 03/12/17 0339 03/13/17 0822  GLUCAP 121* 107* 112*   Lipid Profile:  Recent Labs  03/12/17 0750  CHOL 174  HDL 23*  LDLCALC 123*  TRIG 138  CHOLHDL 7.6   Thyroid Function Tests: No results for input(s): TSH, T4TOTAL, FREET4, T3FREE, THYROIDAB in the last 72 hours. Anemia  Panel: No results for input(s): VITAMINB12, FOLATE, FERRITIN, TIBC, IRON, RETICCTPCT in the last 72 hours. Urine analysis:    Component Value Date/Time   COLORURINE AMBER (A) 03/12/2017 0145   APPEARANCEUR CLOUDY (A) 03/12/2017 0145   LABSPEC 1.020 03/12/2017 0145   PHURINE 5.0 03/12/2017 0145   GLUCOSEU NEGATIVE 03/12/2017 0145   HGBUR MODERATE (A) 03/12/2017 0145   BILIRUBINUR NEGATIVE 03/12/2017 0145   KETONESUR NEGATIVE 03/12/2017 0145   PROTEINUR 30 (A) 03/12/2017 0145   UROBILINOGEN 0.2 06/12/2013 0632   NITRITE NEGATIVE 03/12/2017 0145   LEUKOCYTESUR NEGATIVE 03/12/2017 0145   Sepsis Labs: @LABRCNTIP (procalcitonin:4,lacticidven:4)   ) Recent Results (from the past 240 hour(s))  Blood Culture (routine x 2)     Status: None (Preliminary result)   Collection Time: 03/11/17  6:04 PM  Result Value Ref Range Status   Specimen Description BLOOD RIGHT ANTECUBITAL  Final   Special Requests   Final    BOTTLES DRAWN AEROBIC AND ANAEROBIC Blood Culture adequate volume   Culture   Final    NO GROWTH 2 DAYS Performed at [facility], [place] [address], [place], [place] [address]    Report Status PENDING  Incomplete  MRSA  PCR Screening     Status: None   Collection Time: 03/12/17  1:50 AM  Result Value Ref Range Status   MRSA by PCR NEGATIVE NEGATIVE Final    Comment:        The GeneXpert MRSA Assay (FDA approved for NASAL specimens only), is one component of a comprehensive MRSA colonization surveillance program. It is not intended to diagnose MRSA infection nor to guide or monitor treatment for MRSA infections.   Urine Culture     Status: None   Collection Time: 03/12/17  2:20 AM  Result Value Ref Range Status   Specimen Description URINE, RANDOM  Final   Special Requests NONE  Final   Culture   Final    NO GROWTH Performed at [facility], [address] [place], [place] [address]    Report Status 03/13/2017 FINAL  Final  Blood Culture (routine x 2)     Status: None (Preliminary result)   Collection Time: 03/12/17  7:50 AM  Result Value Ref Range Status   Specimen Description BLOOD RIGHT ARM  Final   Special Requests   Final    IN PEDIATRIC BOTTLE Blood Culture results may not be optimal due to an excessive volume of blood received in culture bottles   Culture   Final    NO GROWTH 1 DAY Performed at [facility], [place] [address], [place], [place] [address]    Report Status PENDING  Incomplete      Radiology Studies: Dg Chest 1 View  Result Date: 03/11/2017 CLINICAL DATA:  Shortness of breath EXAM: CHEST 1 VIEW COMPARISON:  Chest radiograph 01/19/2017 FINDINGS: The lungs are hyperinflated without focal consolidation or pulmonary edema. No pleural effusion or pneumothorax. Normal cardiomediastinal contours. Right shoulder osteoarthrosis. IMPRESSION: Hyperinflated lungs may indicate underlying COPD. No focal airspace disease. Electronically Signed   By: Ulyses Jarred M.D.   On: 03/11/2017 15:13   Ct Head Wo Contrast  Result Date: 03/11/2017 CLINICAL DATA:  Altered mental status. EXAM: CT HEAD WITHOUT CONTRAST TECHNIQUE: Contiguous axial images were obtained from the base of  the skull through the vertex without intravenous contrast. COMPARISON:  CT head dated June 22, 2015. FINDINGS: Brain: No evidence of acute infarction, hemorrhage, hydrocephalus, extra-axial collection or mass lesion/mass effect.  Moderate age-related cerebral atrophy with compensatory dilatation of the ventricles, similar to prior study. Vascular: No hyperdense vessel or unexpected calcification. Skull: Negative for fracture or focal lesion. Sinuses/Orbits: The bilateral paranasal sinuses and mastoid air cells are clear. Bilateral pseudophakia. No acute orbital abnormality. Other: None. IMPRESSION: No acute intracranial abnormality.  Stable cerebral atrophy. Electronically Signed   By: Titus Dubin M.D.   On: 03/11/2017 15:27   Mr Brain Wo Contrast  Result Date: 03/12/2017 CLINICAL DATA:  83 year old female with sepsis. Altered mental status. Possible right upper extremity focal seizure. EXAM: MRI HEAD WITHOUT CONTRAST TECHNIQUE: Multiplanar, multiecho pulse sequences of the brain and surrounding structures were obtained without intravenous contrast. COMPARISON:  Head CT 03/11/2017 and earlier. FINDINGS: Study is mildly degraded by motion artifact despite repeated imaging attempts. Brain: Chronic ventricular prominence is stable since 2017, and includes enlargement of the temporal horns. No evidence of hyperdynamic flow at the cerebral aqueduct. No transependymal edema suspected. No restricted diffusion to suggest acute infarction. No midline shift, mass effect, evidence of mass lesion, extra-axial collection or acute intracranial hemorrhage. Cervicomedullary junction and pituitary are within normal limits. Mild periventricular white matter T2 and FLAIR hyperintensity. No definite cortical encephalomalacia or chronic cerebral blood products. Preserved signal in the deep gray matter nuclei, brainstem, and cerebellum. There is bilateral hippocampal atrophy which does appear disproportionate, although the  degree of bilateral temporal lobe atrophy otherwise appears fairly proportional to the remaining brain. Vascular: Major intracranial vascular flow voids are grossly preserved. Skull and upper cervical spine: Negative. Normal bone marrow signal. Sinuses/Orbits: Postoperative changes to the globes. Otherwise negative orbits soft tissues. Visualized paranasal sinuses and mastoids are stable and well pneumatized. Other: Visible internal auditory structures appear normal. Scalp and face soft tissues appear stable and negative. IMPRESSION: 1. No definite acute intracranial abnormality. 2. Chronic ventriculomegaly has progressed since the earliest 2014 comparisons but is stable since 2017 and favored to be ex vacuo in nature. Electronically Signed   By: Genevie Ann M.D.   On: 03/12/2017 15:11     Scheduled Meds: . aspirin  300 mg Rectal Daily  . mouth rinse  15 mL Mouth Rinse BID   Continuous Infusions: . acyclovir Stopped (03/13/17 2306)  . [START ON 03/15/2017] dextrose 5 % 100 mL with vancomycin (VANCOCIN) 500 mg infusion    . dextrose 100 mL/hr at 03/14/17 0556  . meropenem (MERREM) IV 1 g (03/14/17 1102)  . potassium chloride 10 mEq (03/14/17 0935)     LOS: 3 days    Time spent: 25 minutes  Greater than 50% of the time spent on counseling and coordinating the care.   Pratik Darleen Crocker, DO [facility] Pager [phone]  If 7PM-7AM, please contact night-coverage [email or web address] Password [facility] 03/14/2017, 11:24 AM    Behaviorally she really needs to be seen by palliative care

## 2017-03-15 DIAGNOSIS — E870 Hyperosmolality and hypernatremia: Secondary | ICD-10-CM

## 2017-03-15 DIAGNOSIS — E860 Dehydration: Secondary | ICD-10-CM

## 2017-03-15 DIAGNOSIS — Z515 Encounter for palliative care: Secondary | ICD-10-CM

## 2017-03-15 LAB — GLUCOSE, CAPILLARY: GLUCOSE-CAPILLARY: 126 mg/dL — AB (ref 65–99)

## 2017-03-15 MED ORDER — MORPHINE SULFATE (PF) 4 MG/ML IV SOLN
1.0000 mg | INTRAVENOUS | Status: DC | PRN
  Administered 2017-03-15 – 2017-03-16 (×5): 1 mg via INTRAVENOUS
  Filled 2017-03-15 (×5): qty 1

## 2017-03-15 MED ORDER — LORAZEPAM 2 MG/ML PO CONC: 1.0000 mg | ORAL | Status: DC | PRN

## 2017-03-15 MED ORDER — GLYCOPYRROLATE 0.2 MG/ML IJ SOLN
0.2000 mg | INTRAMUSCULAR | Status: DC | PRN
  Filled 2017-03-15: qty 1

## 2017-03-15 MED ORDER — GLYCOPYRROLATE 0.2 MG/ML IJ SOLN
0.2000 mg | INTRAMUSCULAR | Status: DC | PRN
  Filled 2017-03-15 (×2): qty 1

## 2017-03-15 MED ORDER — OXYCODONE HCL 20 MG/ML PO CONC: 5.0000 mg | ORAL | 0 refills | Status: AC | PRN

## 2017-03-15 MED ORDER — GLYCOPYRROLATE 1 MG PO TABS
1.0000 mg | ORAL_TABLET | ORAL | Status: DC | PRN
  Filled 2017-03-15 (×2): qty 1

## 2017-03-15 MED ORDER — LORAZEPAM 2 MG/ML IJ SOLN: 1.0000 mg | INTRAMUSCULAR | Status: DC | PRN

## 2017-03-15 MED ORDER — OXYCODONE HCL 5 MG/5ML PO SOLN: 5.0000 mg | ORAL | Status: AC

## 2017-03-15 MED ORDER — LORAZEPAM 1 MG PO TABS
1.0000 mg | ORAL_TABLET | ORAL | Status: DC | PRN
  Administered 2017-03-15: 1 mg via ORAL
  Filled 2017-03-15: qty 1

## 2017-03-15 MED ORDER — GLYCOPYRROLATE 1 MG PO TABS: 1.0000 mg | ORAL_TABLET | ORAL | 0 refills | Status: AC | PRN

## 2017-03-15 MED ORDER — SODIUM CHLORIDE 0.9 % IV SOLN: INTRAVENOUS | Status: DC

## 2017-03-15 MED ORDER — LORAZEPAM 2 MG/ML PO CONC: 1.0000 mg | ORAL | 0 refills | Status: AC | PRN

## 2017-03-15 NOTE — Progress Notes (Signed)
Daily Progress Note   Patient Name: Kathleen [name]       Date: 03/15/2017 DOB: [birth year]  Age: 83 y.o. MRN#: [id number] Attending Physician: Rodena Goldmann, DO Primary Care Physician: Jani Gravel, MD Admit Date: 03/11/2017  Reason for Consultation/Follow-up: Establishing goals of care  Subjective: Patient showing no improvement. Discussed transitioning to full comfort care and possible residential hospice with daughter Neoma Laming. She is in agreement.   Review of Systems  Unable to perform ROS: Patient unresponsive    Length of Stay: 4  Current Medications: Scheduled Meds:  . mouth rinse  15 mL Mouth Rinse BID  . oxyCODONE  5 mg Sublingual NOW    Continuous Infusions: . sodium chloride    . dextrose 100 mL/hr at 03/14/17 2238    PRN Meds: glycopyrrolate **OR** glycopyrrolate **OR** glycopyrrolate, LORazepam **OR** LORazepam **OR** LORazepam, morphine injection, oxyCODONE  Physical Exam  Constitutional:  cachexic  Pulmonary/Chest:  Tachypneic, rales, audible secretions at times  Neurological:  unresponsive  Skin: Skin is warm and dry.  Nursing note and vitals reviewed.           Vital Signs: BP (!) 152/68 (BP Location: Right Leg)   Pulse 89   Temp 98.8 F (37.1 C) (Axillary)   Resp 20   Ht 5\' 6"  (1.676 m)   Wt 47.6 kg (105 lb)   SpO2 99%   BMI 16.95 kg/m  SpO2: SpO2: 99 % O2 Device: O2 Device: Nasal Cannula O2 Flow Rate: O2 Flow Rate (L/min): 2 L/min  Intake/output summary:   Intake/Output Summary (Last 24 hours) at 03/15/17 1135 Last data filed at 03/15/17 3646  Gross per 24 hour  Intake             1410 ml  Output              800 ml  Net              610 ml   LBM: Last BM Date: 03/12/17 Baseline Weight: Weight: 47.6 kg (105 lb) Most recent weight:  Weight: 47.6 kg (105 lb)       Palliative Assessment/Data: PPS: 10%      Patient Active Problem List   Diagnosis Date Noted  . Protein-calorie malnutrition, severe 03/13/2017  . Advance care planning   . Palliative care by  specialist   . Hypernatremia 03/11/2017  . Hypercalcemia 03/11/2017  . Elevated troponin 03/11/2017  . AKI (acute kidney injury) ([place]) 03/11/2017  . Abnormal LFTs 03/11/2017  . Normochromic normocytic anemia   . Deep tissue injury 02/10/2017  . Severe malnutrition ([place]) 01/29/2017  . Anemia of chronic disease 01/29/2017  . Psychosis ([place]) 01/29/2017  . Vitamin B 12 deficiency 01/29/2017  . Decubitus ulcer of coccyx, stage IV ([place])   . Encounter for palliative care   . Goals of care, counseling/discussion   . Sepsis ([place]) 01/19/2017  . Hypokalemia 01/19/2017  . Hypoalbuminemia 01/19/2017  . Acute metabolic encephalopathy 38/46/6599  . Atrial fibrillation ([place]) 06/12/2013  . Dementia of Alzheimer's type with behavioral disturbance 06/12/2013  . Severe dehydration 06/12/2013  . History of colon cancer 06/12/2012    Palliative Care Assessment & Plan   Patient Profile: 83 y.o. female  with past medical history of a. Fib, dementia, anemia, colon cancer, alzheimer's, COPD, infected decubitus, malnutrition  admitted on 03/11/2017 with altered mental status.  Workup revealed elevated WBC and lactic acid, significantly elevated serum sodium, CT and MRI negative for acute processes, EEG showed diffuse slowing and dysfunction but no seizure activity. Urine positive for few bacteria, negative for leukocytes- culture pending, blood cultures negative to date. Palliative medicine consulted for [place].  Assessment/Recommendations/Plan   Patient is dying  Comfort orders entered  Discussed stopping noncomfort interventions including IV fluids with Neoma Laming- will order this  Social work order entered for possible residential Hospice bed- if bed is not available today I  anticipate patient will become too unstable for transfer  Code Status:  DNR  Prognosis:   < 2 weeks likely days due to advanced end stage alzheimer's s/p urosepsis, no improvement in mental status, no po intake, unresponsive, transitioning to full comfort measures  Discharge Planning:  To Be Determined  Care plan was discussed with patient's daughter- Terriona Horlacher  Thank you for allowing the Palliative Medicine Team to assist in the care of this patient.   Time In: 1100 Time Out: 1135 Total Time 35 mins Prolonged Time Billed no      Greater than 50%  of this time was spent counseling and coordinating care related to the above assessment and plan.  Mariana Kaufman, AGNP-C Palliative Medicine   Please contact Palliative Medicine Team phone at [phone] for questions and concerns.

## 2017-03-15 NOTE — Clinical Social Work Note (Signed)
Clinical Social Work Assessment  Patient Details  Name: Kathleen [name] MRN: [id number] Date of Birth: 07/28/1925  Date of referral:  03/15/17               Reason for consult:  End of Life/Hospice                Permission sought to share information with:  Family Supports, Chartered certified accountant granted to share information::     Name::     daughter Tax adviser::  [facility]  Relationship::     Contact Information:     Housing/Transportation Living arrangements for the past 2 months:  Single Family Home Source of Information:  Adult Children Patient Interpreter Needed:  None Criminal Activity/Legal Involvement Pertinent to Current Situation/Hospitalization:  No - Comment as needed Significant Relationships:  Adult Children Lives with:  Self Do you feel safe going back to the place where you live?  Yes Need for family participation in patient care:  Yes (Comment) (pt unresponsive)  Care giving concerns:  Pt from home where she resides alone. Pt actively dying per providers. Family requesting referral to residential hospice   Social Worker assessment / plan:  CSW consulted for assistance referring pt to residential hospice.  Met with pt/daughter at bedside. Pt unresponsive. Daughter states her sister is on her way from [place] in anticipating of mother's passing. They would like to find out if she could be transferred to [facility]. Made referral to [facility] and will follow. Updated attending. Pt's safety in transferring will be assessed should residential hospice bed be available.   Employment status:  Retired Nurse, adult PT Recommendations:  Not assessed at this time Information / Referral to community resources:   [facility])  Patient/Family's Response to care:  Daughter appreciative of care  Patient/Family's Understanding of and Emotional Response to Diagnosis, Current Treatment, and Prognosis:  Pt  unresponsive- daughter demonstrates understanding and seems to be processing events. Does clearly state that family wants pt to be in [facility] for end of life care if possible.   Emotional Assessment Appearance:  Appears older than stated age Attitude/Demeanor/Rapport:   (sleeping) Affect (typically observed):   (sleeping) Orientation:   (UTA) Alcohol / Substance use:  Not Applicable Psych involvement (Current and /or in the community):  No (Comment)  Discharge Needs  Concerns to be addressed:  No discharge needs identified Readmission within the last 30 days:  No Current discharge risk:  None Barriers to Discharge:  No Barriers Identified   Nila Nephew, LCSW 03/15/2017, 11:56 AM  [phone]

## 2017-03-15 NOTE — Progress Notes (Signed)
[facility] Liaison:  RN  Received request from [place], [place], for family interest in [facility].  Chart reviewed and spoke with Neoma Laming, daughter to acknowledge referral.  Unfortunately, [facility] is not able to offer a room today.  Family and LCSW are aware [facility] liaison will follow up with LCSW and family tomorrow or sooner if room becomes available.  Please do not hesitate to call with questions.    Thank you for this referral.  Edyth Gunnels, RN, [facility] Liaison [phone]  All hospital liaisons are now on [place].

## 2017-03-15 NOTE — Care Management Important Message (Signed)
Important Message  Patient Details  Name: Kathleen [name] MRN: [id number] Date of Birth: 12/16/1925   Medicare Important Message Given:  Yes    Kerin Salen 03/15/2017, 12:45 [place] Message  Patient Details  Name: Kathleen [name] MRN: [id number] Date of Birth: 05/05/1926   Medicare Important Message Given:  Yes    Kerin Salen 03/15/2017, 12:45 PM

## 2017-03-15 NOTE — Progress Notes (Signed)
Patient ID: Kathleen [name], female   DOB: 25-May-1926, 83 y.o.   MRN: [id number]  PROGRESS NOTE    Kathleen [name]  KWI:[id number] DOB: 06/19/1925 DOA: 03/11/2017  PCP: Kathleen Gravel, MD   Brief Narrative:  83 year old female with history of atrial fibrillation, not on anticoagulation secondary to dementia, psychosis, colon cancer. Patient presented to ED due to worsening confusion, altered mental status progressively getting worse over past couple of days prior to this admission. Patient stopped talking to family members. Her appetite declined. Family noted that pt had some repetitive motions of her right hand. On admission, temperature was 96 F, HR 106, RR 46, BP 76/41. She was [name] to have a white blood cell count of 16.6, lactic acid 2.2, ammonia level 34. She had mild elevation in troponin level 0.13, creatinine 2.9 and sodium 175, calcium 10.6. Urinalysis showed a few bacteria. Chest x-ray showed hyperinflated lungs, COPD. She was started on empiric meropenem while awaiting culture results.  In the interim, patient has had a brain MRI as well as EEG demonstrating progressive atrophy that is generalized as well as moderate diffuse slowing with occasional suppressed pattern as noted and severe encephalopathy with no active seizures, respectively. I spoke with Dr. Rory [name] of neurology on 10/17 who feels that this is end stage and that the patient should be considered for hospice care. Daughter at the bedside refuses any aggressive measures including a lumbar puncture. No acute overnight events noted. Patient's mentation does not appear to be improving despite improvements in serum sodium. Pt now comfort measures only. CSW working on transfer to hospice facility.  Assessment & Plan:   Principal Problem:   Acute metabolic  / dementia without behavioral disturbance - Fluids and abx dc -CMO  Active Problems:   Sepsis / leukocytosis -CMO    Chronic atrial fibrillation (HCC) - Rectal ASA DC    Severe  dehydration / Hypercalcemia / Hypernatremia     Decubitus ulcer of coccyx, stage IV (HCC)    Severe protein calorie malnutrition (HCC)    Anemia of chronic disease    Thrombocytopenia-stable    Elevated troponin-NSTEMI II    AKI (acute kidney injury) (Kathleen [name])  DVT prophylaxis: SCD's Code Status: DNR/DNI-CMO Family Communication: With daughter at bedsie Disposition Plan: To hospice facility in AM   Consultants:   PCT  Neurology   Procedures:   None   Antimicrobials:   Meropenem 03/11/2017 --> 10/19  Vancomycin 03/12/2017->"  Acyclovir 03/12/2017->"   Objective: Vitals:   03/13/17 1832 03/14/17 0453 03/14/17 1855 03/15/17 0607  BP: (!) 131/57 (!) 155/59 (!) 143/64 (!) 152/68  Pulse: 97 (!) 103  89  Resp: 20 18 18 20   Temp: 98.6 F (37 C) 98.1 F (36.7 C) 99.5 F (37.5 C) 98.8 F (37.1 C)  TempSrc: Oral Axillary Axillary Axillary  SpO2: 98% 100% 98% 99%  Weight:      Height:        Intake/Output Summary (Last 24 hours) at 03/15/17 1745 Last data filed at 03/15/17 1030  Gross per 24 hour  Intake             1410 ml  Output             1200 ml  Net              210 ml   Filed Weights   03/11/17 1850  Weight: 47.6 kg (105 lb)    Examination:  General exam: Appears ill, malnourished on [place]; unresponsive Respiratory  system: diminished and coarse breath sounds Cardiovascular system: S1 & S2 heard, tachycardic Gastrointestinal system: Abdomen is nondistended, soft and nontender. No organomegaly or masses felt. Normal bowel sounds heard. Central nervous system: patient is sleeping, responds minimally to verbal stimulation Extremities: no swelling, pulses palpable Skin: No rashes, lesions or ulcers Psychiatry: cannot be evaluated  Data Reviewed: I have personally reviewed following labs and imaging studies  CBC:  Recent Labs Lab 03/11/17 1804 03/12/17 0750 03/13/17 0827 03/14/17 0426  WBC 16.6* 18.4* 17.7* 15.5*  NEUTROABS 14.9*  --   --   --    HGB 9.8* 9.5* 9.7* 9.2*  HCT 34.3* 33.1* 33.4* 31.6*  MCV 94.8 95.1 94.9 93.8  PLT 134* 129* 123* 95*   Basic Metabolic Panel:  Recent Labs Lab 03/13/17 0206 03/13/17 0827 03/13/17 1026 03/13/17 1343 03/14/17 0426  NA 171* 169* 168* 167* 163*  K 3.8 3.2* 3.2* 3.2* 2.9*  CL >130* >130* >130* >130* >130*  CO2 22 22 20* 20* 21*  GLUCOSE 89 132* 131* 139* 134*  BUN 97* 89* 90* 86* 69*  CREATININE 2.33* 2.16* 2.11* 2.09* 1.74*  CALCIUM 10.6* 10.7* 10.6* 10.5* 10.2  MG  --   --   --   --  1.8   GFR: Estimated Creatinine Clearance: 16.5 mL/min (A) (by C-G formula based on SCr of 1.74 mg/dL (H)). Liver Function Tests:  Recent Labs Lab 03/11/17 1804  AST 61*  ALT 92*  ALKPHOS 126  BILITOT 1.9*  PROT 6.6  ALBUMIN 1.7*   No results for input(s): LIPASE, AMYLASE in the last 168 hours.  Recent Labs Lab 03/11/17 1804  AMMONIA 34   Coagulation Profile:  Recent Labs Lab 03/11/17 2242  INR 1.65   Cardiac Enzymes:  Recent Labs Lab 03/11/17 2242 03/12/17 0750  TROPONINI 0.15* 0.10*   BNP (last 3 results) No results for input(s): PROBNP in the last 8760 hours. HbA1C: No results for input(s): HGBA1C in the last 72 hours. CBG:  Recent Labs Lab 03/11/17 1450 03/12/17 0339 03/13/17 0822 03/15/17 0818  GLUCAP 121* 107* 112* 126*   Lipid Profile: No results for input(s): CHOL, HDL, LDLCALC, TRIG, CHOLHDL, LDLDIRECT in the last 72 hours. Thyroid Function Tests: No results for input(s): TSH, T4TOTAL, FREET4, T3FREE, THYROIDAB in the last 72 hours. Anemia Panel: No results for input(s): VITAMINB12, FOLATE, FERRITIN, TIBC, IRON, RETICCTPCT in the last 72 hours. Urine analysis:    Component Value Date/Time   COLORURINE AMBER (A) 03/12/2017 0145   APPEARANCEUR CLOUDY (A) 03/12/2017 0145   LABSPEC 1.020 03/12/2017 0145   PHURINE 5.0 03/12/2017 0145   GLUCOSEU NEGATIVE 03/12/2017 0145   HGBUR MODERATE (A) 03/12/2017 0145   BILIRUBINUR NEGATIVE 03/12/2017 0145     KETONESUR NEGATIVE 03/12/2017 0145   PROTEINUR 30 (A) 03/12/2017 0145   UROBILINOGEN 0.2 06/12/2013 0632   NITRITE NEGATIVE 03/12/2017 0145   LEUKOCYTESUR NEGATIVE 03/12/2017 0145   Sepsis Labs: @LABRCNTIP (procalcitonin:4,lacticidven:4)   ) Recent Results (from the past 240 hour(s))  Blood Culture (routine x 2)     Status: None (Preliminary result)   Collection Time: 03/11/17  6:04 PM  Result Value Ref Range Status   Specimen Description BLOOD RIGHT ANTECUBITAL  Final   Special Requests   Final    BOTTLES DRAWN AEROBIC AND ANAEROBIC Blood Culture adequate volume   Culture   Final    NO GROWTH 4 DAYS Performed at [facility], [place] [address] [place], [place] [address]    Report Status PENDING  Incomplete  MRSA PCR Screening     Status: None   Collection Time: 03/12/17  1:50 AM  Result Value Ref Range Status   MRSA by PCR NEGATIVE NEGATIVE Final    Comment:        The GeneXpert MRSA Assay (FDA approved for NASAL specimens only), is one component of a comprehensive MRSA colonization surveillance program. It is not intended to diagnose MRSA infection nor to guide or monitor treatment for MRSA infections.   Urine Culture     Status: None   Collection Time: 03/12/17  2:20 AM  Result Value Ref Range Status   Specimen Description URINE, RANDOM  Final   Special Requests NONE  Final   Culture   Final    NO GROWTH Performed at [facility], [address], [place], [place] [address]    Report Status 03/13/2017 FINAL  Final  Blood Culture (routine x 2)     Status: None (Preliminary result)   Collection Time: 03/12/17  7:50 AM  Result Value Ref Range Status   Specimen Description BLOOD RIGHT ARM  Final   Special Requests   Final    IN PEDIATRIC BOTTLE Blood Culture results may not be optimal due to an excessive volume of blood received in culture bottles   Culture   Final    NO GROWTH 3 DAYS Performed at [facility], [place] [address] [place],  [place] [address]    Report Status PENDING  Incomplete      Radiology Studies: Mr Brain 57 Contrast  Result Date: 03/12/2017 CLINICAL DATA:  83 year old female with sepsis. Altered mental status. Possible right upper extremity focal seizure. EXAM: MRI HEAD WITHOUT CONTRAST TECHNIQUE: Multiplanar, multiecho pulse sequences of the brain and surrounding structures were obtained without intravenous contrast. COMPARISON:  Head CT 03/11/2017 and earlier. FINDINGS: Study is mildly degraded by motion artifact despite repeated imaging attempts. Brain: Chronic ventricular prominence is stable since 2017, and includes enlargement of the temporal horns. No evidence of hyperdynamic flow at the cerebral aqueduct. No transependymal edema suspected. No restricted diffusion to suggest acute infarction. No midline shift, mass effect, evidence of mass lesion, extra-axial collection or acute intracranial hemorrhage. Cervicomedullary junction and pituitary are within normal limits. Mild periventricular white matter T2 and FLAIR hyperintensity. No definite cortical encephalomalacia or chronic cerebral blood products. Preserved signal in the deep gray matter nuclei, brainstem, and cerebellum. There is bilateral hippocampal atrophy which does appear disproportionate, although the degree of bilateral temporal lobe atrophy otherwise appears fairly proportional to the remaining brain. Vascular: Major intracranial vascular flow voids are grossly preserved. Skull and upper cervical spine: Negative. Normal bone marrow signal. Sinuses/Orbits: Postoperative changes to the globes. Otherwise negative orbits soft tissues. Visualized paranasal sinuses and mastoids are stable and well pneumatized. Other: Visible internal auditory structures appear normal. Scalp and face soft tissues appear stable and negative. IMPRESSION: 1. No definite acute intracranial abnormality. 2. Chronic ventriculomegaly has progressed since the earliest 2014 comparisons but  is stable since 2017 and favored to be ex vacuo in nature. Electronically Signed   By: Genevie Ann M.D.   On: 03/12/2017 15:11     Scheduled Meds: . mouth rinse  15 mL Mouth Rinse BID  . oxyCODONE  5 mg Oral NOW   Continuous Infusions:    LOS: 4 days    Time spent: 25 minutes  Greater than 50% of the time spent on counseling and coordinating the care.   Kathleen [name] Darleen Crocker, DO [facility] Pager [phone]  If 7PM-7AM, please contact night-coverage [email or web address] Password [email or web address] 03/15/2017, 5:45 PM    Behaviorally she really needs to be seen by palliative care

## 2017-03-16 LAB — CULTURE, BLOOD (ROUTINE X 2)
CULTURE: NO GROWTH
Special Requests: ADEQUATE

## 2017-03-16 NOTE — Progress Notes (Signed)
Pt. left hospital via stretcher with PTAR. Family at bedside. Medicated with Morphine 1mg  IV prior to transport. Discharged to [facility], Hospice RN completed arrangements.

## 2017-03-16 NOTE — Progress Notes (Signed)
Report called to Dala Dock RN at [facility]. Family at bedside awaiting ride from [facility] [place].

## 2017-03-16 NOTE — Discharge Summary (Signed)
Physician Discharge Summary  Kathleen [name] CNO:[id number] DOB: 1925/09/14 DOA: 03/11/2017  PCP: Jani Gravel, MD  Admit date: 03/11/2017 Discharge date: 03/16/2017  Admitted From: Home Disposition:   Residential Hospice  Recommendations for Outpatient Follow-up:  None   Home Health:No Equipment/Devices:Oxygen  Discharge Condition:Hospice CODE STATUS:Comfort Care Diet recommendation: NPO  Brief/Interim Summary:  83 year old female with history of atrial fibrillation, not on anticoagulation secondary to dementia, psychosis, colon cancer. Patient presented to ED due to worsening confusion, altered mental status progressively getting worse over past couple of days prior to this admission. Patient stopped talking to family members. Her appetite declined. Family noted that pt had some repetitive motions of her right hand. On admission, temperature was 96 F, HR 106, RR 46, BP 76/41. She was found to have a white blood cell count of 16.6, lactic acid 2.2, ammonia level 34. She had mild elevation in troponin level 0.13, creatinine 2.9 and sodium 175, calcium 10.6. Urinalysis showed a few bacteria. Chest x-ray showed hyperinflated lungs, COPD. She was started on empiric meropenem while awaiting culture results which demonstrated no growth.   In the interim, patient has had a brain MRI as well as EEG demonstrating progressive atrophy that is generalized as well as moderate diffuse slowing with occasional suppressed pattern as noted and severe encephalopathy with no active seizures, respectively. I spoke with Dr. Rory Percy of neurology on 10/17 who feels that this is end stage and that the patient should be considered for hospice care. Daughter at the bedside refuses any aggressive measures including a lumbar puncture. No acute overnight events noted. Patient's mentation does not appear to be improving despite improvements in serum sodium. Pt now comfort measures only. CSW working on transfer to hospice  facility which has been arranged for today.  Discharge Diagnoses:  Principal Problem:   Acute metabolic encephalopathy Active Problems:   Atrial fibrillation (HCC)   Dementia of Alzheimer's type with behavioral disturbance   Severe dehydration   Sepsis (HCC)   Decubitus ulcer of coccyx, stage IV (HCC)   Severe malnutrition (HCC)   Anemia of chronic disease   Hypernatremia   Hypercalcemia   Elevated troponin   AKI (acute kidney injury) ([facility])   Abnormal LFTs   Protein-calorie malnutrition, severe   Advance care planning   Palliative care by specialist   Terminal care   Acute metabolic  / dementia without behavioral disturbance - Fluids and abx dc -[facility]  Active Problems:   Sepsis / leukocytosis -[facility]    Chronic atrial fibrillation (HCC) - Rectal ASA DC    Severe dehydration / Hypercalcemia / Hypernatremia     Decubitus ulcer of coccyx, stage IV (HCC)    Severe protein calorie malnutrition (HCC)    Anemia of chronic disease    Thrombocytopenia-stable    Elevated troponin-NSTEMI II    AKI (acute kidney injury) [facility])  Discharge Instructions  Discharge Instructions    Diet - low sodium heart healthy    Complete by:  As directed    Increase activity slowly    Complete by:  As directed      Allergies as of 03/16/2017      Reactions   Penicillins Other (See Comments)   Pt family and pt does not know the reaction. Per records, she has tolerated Rocephin.      Medication List    STOP taking these medications   acetaminophen 325 MG tablet Commonly known as:  TYLENOL   aspirin EC 81 MG tablet   bisacodyl 10  MG suppository Commonly known as:  DULCOLAX   cholestyramine 4 g packet Commonly known as:  QUESTRAN   DECUBI-VITE Caps   donepezil 10 MG tablet Commonly known as:  ARICEPT   feeding supplement (PRO-STAT SUGAR FREE 64) Liqd   ferrous sulfate 325 (65 FE) MG tablet   GERITOL PO   memantine 10 MG tablet Commonly known as:  NAMENDA    NUTRITIONAL SUPPLEMENT PO   polyethylene glycol packet Commonly known as:  MIRALAX / GLYCOLAX   sulfamethoxazole-trimethoprim 800-160 MG tablet Commonly known as:  BACTRIM DS,SEPTRA DS   VITAMIN B-12 CR PO   Vitamin D (Ergocalciferol) 50000 units Caps capsule Commonly known as:  DRISDOL     TAKE these medications   glycopyrrolate 1 MG tablet Commonly known as:  ROBINUL Take 1 tablet (1 mg total) by mouth every 4 (four) hours as needed (excessive secretions).   LORazepam 2 MG/ML concentrated solution Commonly known as:  ATIVAN Place 0.5 mLs (1 mg total) under the tongue every 4 (four) hours as needed for anxiety.   oxyCODONE 20 MG/ML concentrated solution Commonly known as:  ROXICODONE INTENSOL Place 0.3 mLs (6 mg total) under the tongue every 4 (four) hours as needed for moderate pain (or other signs of discomfort).       Allergies  Allergen Reactions  . Penicillins Other (See Comments)    Pt family and pt does not know the reaction. Per records, she has tolerated Rocephin.    Consultations:  None   Procedures/Studies: Dg Chest 1 View  Result Date: 03/11/2017 CLINICAL DATA:  Shortness of breath EXAM: CHEST 1 VIEW COMPARISON:  Chest radiograph 01/19/2017 FINDINGS: The lungs are hyperinflated without focal consolidation or pulmonary edema. No pleural effusion or pneumothorax. Normal cardiomediastinal contours. Right shoulder osteoarthrosis. IMPRESSION: Hyperinflated lungs may indicate underlying COPD. No focal airspace disease. Electronically Signed   By: Ulyses Jarred M.D.   On: 03/11/2017 15:13   Ct Head Wo Contrast  Result Date: 03/11/2017 CLINICAL DATA:  Altered mental status. EXAM: CT HEAD WITHOUT CONTRAST TECHNIQUE: Contiguous axial images were obtained from the base of the skull through the vertex without intravenous contrast. COMPARISON:  CT head dated June 22, 2015. FINDINGS: Brain: No evidence of acute infarction, hemorrhage, hydrocephalus, extra-axial  collection or mass lesion/mass effect. Moderate age-related cerebral atrophy with compensatory dilatation of the ventricles, similar to prior study. Vascular: No hyperdense vessel or unexpected calcification. Skull: Negative for fracture or focal lesion. Sinuses/Orbits: The bilateral paranasal sinuses and mastoid air cells are clear. Bilateral pseudophakia. No acute orbital abnormality. Other: None. IMPRESSION: No acute intracranial abnormality.  Stable cerebral atrophy. Electronically Signed   By: Titus Dubin M.D.   On: 03/11/2017 15:27   Mr Brain Wo Contrast  Result Date: 03/12/2017 CLINICAL DATA:  83 year old female with sepsis. Altered mental status. Possible right upper extremity focal seizure. EXAM: MRI HEAD WITHOUT CONTRAST TECHNIQUE: Multiplanar, multiecho pulse sequences of the brain and surrounding structures were obtained without intravenous contrast. COMPARISON:  Head CT 03/11/2017 and earlier. FINDINGS: Study is mildly degraded by motion artifact despite repeated imaging attempts. Brain: Chronic ventricular prominence is stable since 2017, and includes enlargement of the temporal horns. No evidence of hyperdynamic flow at the cerebral aqueduct. No transependymal edema suspected. No restricted diffusion to suggest acute infarction. No midline shift, mass effect, evidence of mass lesion, extra-axial collection or acute intracranial hemorrhage. Cervicomedullary junction and pituitary are within normal limits. Mild periventricular white matter T2 and FLAIR hyperintensity. No definite cortical encephalomalacia or chronic  cerebral blood products. Preserved signal in the deep gray matter nuclei, brainstem, and cerebellum. There is bilateral hippocampal atrophy which does appear disproportionate, although the degree of bilateral temporal lobe atrophy otherwise appears fairly proportional to the remaining brain. Vascular: Major intracranial vascular flow voids are grossly preserved. Skull and upper  cervical spine: Negative. Normal bone marrow signal. Sinuses/Orbits: Postoperative changes to the globes. Otherwise negative orbits soft tissues. Visualized paranasal sinuses and mastoids are stable and well pneumatized. Other: Visible internal auditory structures appear normal. Scalp and face soft tissues appear stable and negative. IMPRESSION: 1. No definite acute intracranial abnormality. 2. Chronic ventriculomegaly has progressed since the earliest 2014 comparisons but is stable since 2017 and favored to be ex vacuo in nature. Electronically Signed   By: Genevie Ann M.D.   On: 03/12/2017 15:11   Discharge Exam: Vitals:   03/15/17 0607 03/16/17 0534  BP: (!) 152/68 (!) 167/62  Pulse: 89 90  Resp: 20 18  Temp: 98.8 F (37.1 C) 97.9 F (36.6 C)  SpO2: 99% 100%   Vitals:   03/14/17 0453 03/14/17 1855 03/15/17 0607 03/16/17 0534  BP: (!) 155/59 (!) 143/64 (!) 152/68 (!) 167/62  Pulse: (!) 103  89 90  Resp: 18 18 20 18   Temp: 98.1 F (36.7 C) 99.5 F (37.5 C) 98.8 F (37.1 C) 97.9 F (36.6 C)  TempSrc: Axillary Axillary Axillary Axillary  SpO2: 100% 98% 99% 100%  Weight:      Height:        General: Non responsive on [place] Cardiovascular: RRR, S1/S2 +, no rubs, no gallops Respiratory: CTA bilaterally, no wheezing, no rhonchi Abdominal: Soft, NT, ND, bowel sounds + Extremities: no edema, no cyanosis    The results of significant diagnostics from this hospitalization (including imaging, microbiology, ancillary and laboratory) are listed below for reference.     Microbiology: Recent Results (from the past 240 hour(s))  Blood Culture (routine x 2)     Status: None (Preliminary result)   Collection Time: 03/11/17  6:04 PM  Result Value Ref Range Status   Specimen Description BLOOD RIGHT ANTECUBITAL  Final   Special Requests   Final    BOTTLES DRAWN AEROBIC AND ANAEROBIC Blood Culture adequate volume   Culture   Final    NO GROWTH 4 DAYS Performed at [facility], [address], [place], [place] [address]    Report Status PENDING  Incomplete  MRSA PCR Screening     Status: None   Collection Time: 03/12/17  1:50 AM  Result Value Ref Range Status   MRSA by PCR NEGATIVE NEGATIVE Final    Comment:        The GeneXpert MRSA Assay (FDA approved for NASAL specimens only), is one component of a comprehensive MRSA colonization surveillance program. It is not intended to diagnose MRSA infection nor to guide or monitor treatment for MRSA infections.   Urine Culture     Status: None   Collection Time: 03/12/17  2:20 AM  Result Value Ref Range Status   Specimen Description URINE, RANDOM  Final   Special Requests NONE  Final   Culture   Final    NO GROWTH Performed at [facility], [address], [place], [place] [address]    Report Status 03/13/2017 FINAL  Final  Blood Culture (routine x 2)     Status: None (Preliminary result)   Collection Time: 03/12/17  7:50 AM  Result Value Ref Range Status   Specimen  Description BLOOD RIGHT ARM  Final   Special Requests   Final    IN PEDIATRIC BOTTLE Blood Culture results may not be optimal due to an excessive volume of blood received in culture bottles   Culture   Final    NO GROWTH 3 DAYS Performed at [facility], [place] [address], [place], [place] [address]    Report Status PENDING  Incomplete     Labs: BNP (last 3 results) No results for input(s): BNP in the last 8760 hours. Basic Metabolic Panel:  Recent Labs Lab 03/13/17 0206 03/13/17 0827 03/13/17 1026 03/13/17 1343 03/14/17 0426  NA 171* 169* 168* 167* 163*  K 3.8 3.2* 3.2* 3.2* 2.9*  CL >130* >130* >130* >130* >130*  CO2 22 22 20* 20* 21*  GLUCOSE 89 132* 131* 139* 134*  BUN 97* 89* 90* 86* 69*  CREATININE 2.33* 2.16* 2.11* 2.09* 1.74*  CALCIUM 10.6* 10.7* 10.6* 10.5* 10.2  MG  --   --   --   --  1.8   Liver Function Tests:  Recent Labs Lab 03/11/17 1804  AST 61*  ALT 92*  ALKPHOS 126  BILITOT 1.9*  PROT 6.6  ALBUMIN  1.7*   No results for input(s): LIPASE, AMYLASE in the last 168 hours.  Recent Labs Lab 03/11/17 1804  AMMONIA 34   CBC:  Recent Labs Lab 03/11/17 1804 03/12/17 0750 03/13/17 0827 03/14/17 0426  WBC 16.6* 18.4* 17.7* 15.5*  NEUTROABS 14.9*  --   --   --   HGB 9.8* 9.5* 9.7* 9.2*  HCT 34.3* 33.1* 33.4* 31.6*  MCV 94.8 95.1 94.9 93.8  PLT 134* 129* 123* 95*   Cardiac Enzymes:  Recent Labs Lab 03/11/17 2242 03/12/17 0750  TROPONINI 0.15* 0.10*   BNP: Invalid input(s): POCBNP CBG:  Recent Labs Lab 03/11/17 1450 03/12/17 0339 03/13/17 0822 03/15/17 0818  GLUCAP 121* 107* 112* 126*   D-Dimer No results for input(s): DDIMER in the last 72 hours. Hgb A1c No results for input(s): HGBA1C in the last 72 hours. Lipid Profile No results for input(s): CHOL, HDL, LDLCALC, TRIG, CHOLHDL, LDLDIRECT in the last 72 hours. Thyroid function studies No results for input(s): TSH, T4TOTAL, T3FREE, THYROIDAB in the last 72 hours.  Invalid input(s): FREET3 Anemia work up No results for input(s): VITAMINB12, FOLATE, FERRITIN, TIBC, IRON, RETICCTPCT in the last 72 hours. Urinalysis    Component Value Date/Time   COLORURINE AMBER (A) 03/12/2017 0145   APPEARANCEUR CLOUDY (A) 03/12/2017 0145   LABSPEC 1.020 03/12/2017 0145   PHURINE 5.0 03/12/2017 0145   GLUCOSEU NEGATIVE 03/12/2017 0145   HGBUR MODERATE (A) 03/12/2017 0145   BILIRUBINUR NEGATIVE 03/12/2017 0145   KETONESUR NEGATIVE 03/12/2017 0145   PROTEINUR 30 (A) 03/12/2017 0145   UROBILINOGEN 0.2 06/12/2013 0632   NITRITE NEGATIVE 03/12/2017 0145   LEUKOCYTESUR NEGATIVE 03/12/2017 0145   Sepsis Labs Invalid input(s): PROCALCITONIN,  WBC,  LACTICIDVEN Microbiology Recent Results (from the past 240 hour(s))  Blood Culture (routine x 2)     Status: None (Preliminary result)   Collection Time: 03/11/17  6:04 PM  Result Value Ref Range Status   Specimen Description BLOOD RIGHT ANTECUBITAL  Final   Special Requests    Final    BOTTLES DRAWN AEROBIC AND ANAEROBIC Blood Culture adequate volume   Culture   Final    NO GROWTH 4 DAYS Performed at [facility], [place] [address] [place], [place] [address]    Report Status PENDING  Incomplete  MRSA PCR Screening     Status: None   Collection Time: 03/12/17  1:50 AM  Result Value Ref Range Status   MRSA by PCR NEGATIVE NEGATIVE Final    Comment:        The GeneXpert MRSA Assay (FDA approved for NASAL specimens only), is one component of a comprehensive MRSA colonization surveillance program. It is not intended to diagnose MRSA infection nor to guide or monitor treatment for MRSA infections.   Urine Culture     Status: None   Collection Time: 03/12/17  2:20 AM  Result Value Ref Range Status   Specimen Description URINE, RANDOM  Final   Special Requests NONE  Final   Culture   Final    NO GROWTH Performed at [facility], [address] [place], [place] [address]    Report Status 03/13/2017 FINAL  Final  Blood Culture (routine x 2)     Status: None (Preliminary result)   Collection Time: 03/12/17  7:50 AM  Result Value Ref Range Status   Specimen Description BLOOD RIGHT ARM  Final   Special Requests   Final    IN PEDIATRIC BOTTLE Blood Culture results may not be optimal due to an excessive volume of blood received in culture bottles   Culture   Final    NO GROWTH 3 DAYS Performed at [facility], [place] [address], [place], [place] [address]    Report Status PENDING  Incomplete     Time coordinating discharge: Over 30 minutes  SIGNED:   Rodena Goldmann, DO  [facility] 03/16/2017, 10:54 AM Pager   If 7PM-7AM, please contact night-coverage [email or web address] Password [email or web address]

## 2017-03-16 NOTE — Progress Notes (Signed)
Pt discharging to transfer to [facility] today for residential hospice care. [facility] liaison completed admission paperwork at bedside with daughter. Pt still unresponsive, but daughter agreeable to plan. Completed medical necessity form and liaison arranged transportation. Report # for RN [phone].  Sharren Bridge, MSW, LCSW Clinical Social Work 03/16/2017 [phone] Weekend coverage

## 2017-03-16 NOTE — Progress Notes (Signed)
1100--[facility]--[facility] RN--[facility] Referral  Bed available at [facility] today. Kathleen [name], daughter still desires [facility]. Met with Kathleen [name] to obtain informed consent . Patient unable to participate due to unresponsiveness. Kathleen [name] agreeable to transfer today. Meghan, [place] aware. Registration paperwork completed. Dr. Orpah Melter to assume care for patient per family request.   Discharge summary was faxed to [phone].  Brayton Layman, RN has called report to Helene Kelp, Therapist, sports at [employer] at [phone].   [facility] was called at 11:30 am, and we were informed we are 4th on the list.  Thank you, Margaretmary Eddy, RN, [facility] Liaison [phone]  [place] are on AMION.

## 2017-03-17 LAB — CULTURE, BLOOD (ROUTINE X 2): CULTURE: NO GROWTH

## 2017-03-28 DEATH — deceased

## 2018-09-20 IMAGING — MR MR HEAD W/O CM
9 of 12 series · 34 of 48 positions shown · non-contrast
Comparison: Head CT 03/11/2017 and earlier.

CLINICAL DATA: 89-year-old female with sepsis. Altered mental
status. Possible right upper extremity focal seizure.

EXAM:
MRI HEAD WITHOUT CONTRAST
TECHNIQUE: Multiplanar, multiecho pulse sequences of the brain and surrounding
structures were obtained without intravenous contrast.

[Series 3: DWI · axial · 3.0mm · 1.09mm/px · z∈[-75,+64]mm · 8 of 96 slices shown (1 of 4)]
[im 1/96]
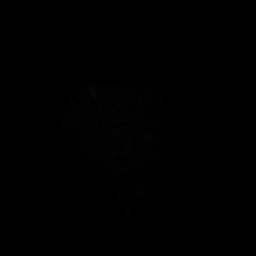
[im 11/96]
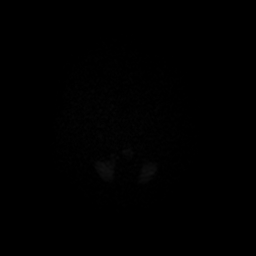
[im 32/96]
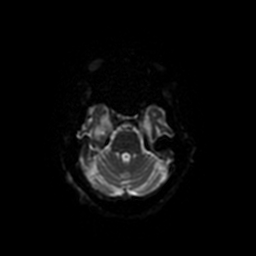
[im 43/96]
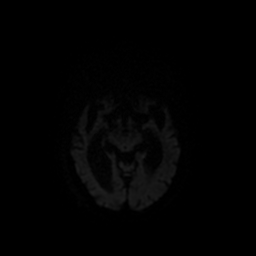
[im 53/96]
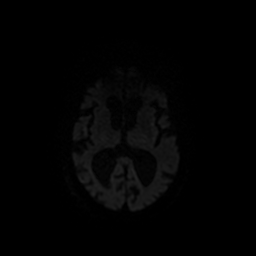
[im 64/96]
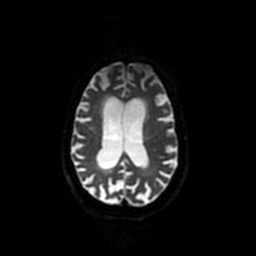
[im 85/96]
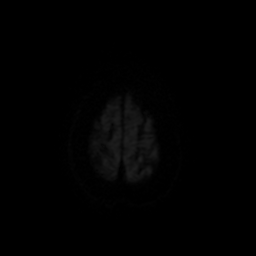
[im 96/96]
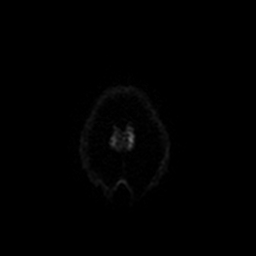

[Series 4: FLAIR · axial · 5.0mm · 0.43mm/px · z∈[-70,+67]mm · 2 of 24 slices shown]
[im 1/24]
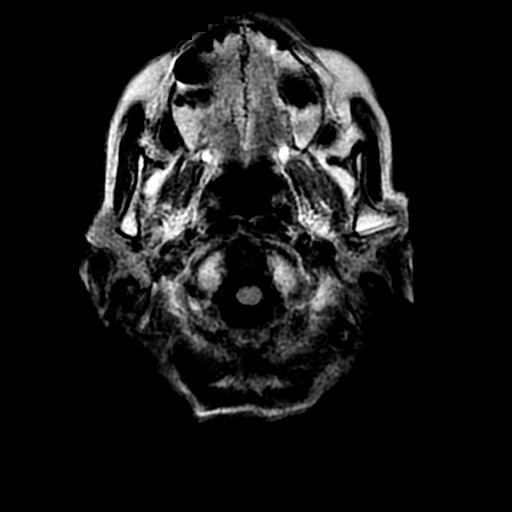
[im 24/24]
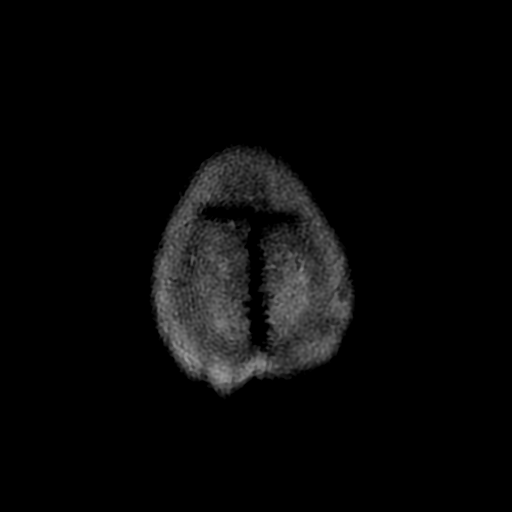

[Series 5: T1 · sagittal · 5.0mm · 0.47mm/px · 1 of 22 slices shown]
[im 1/22]
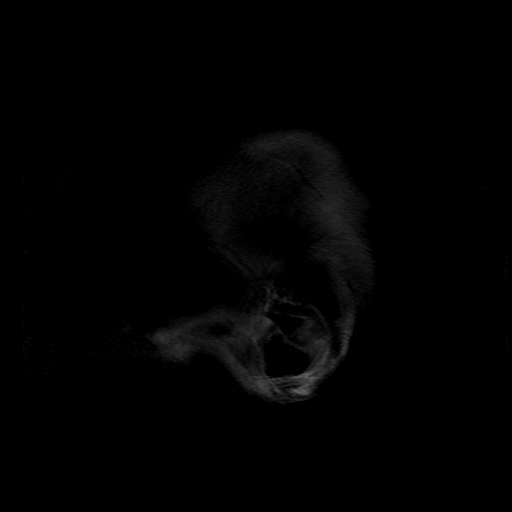

[Series 6: T2 · axial · 5.0mm · 0.43mm/px · z∈[-70,+67]mm · 2 of 24 slices shown (1 of 3)]
[im 1/24]
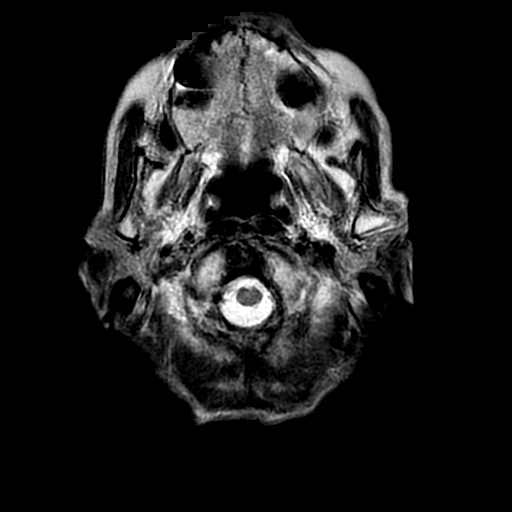
[im 24/24]
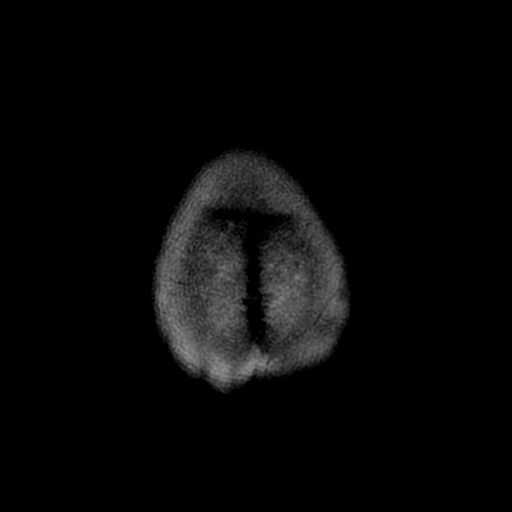

[Series 8: DWI · coronal · 5.0mm · 1.09mm/px · 7 of 66 slices shown (2 of 4)]
[im 1/66]
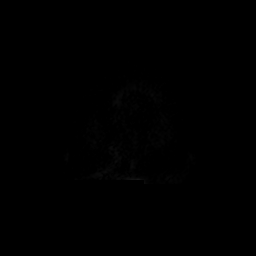
[im 11/66]
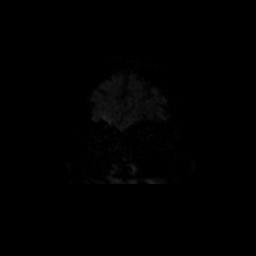
[im 22/66]
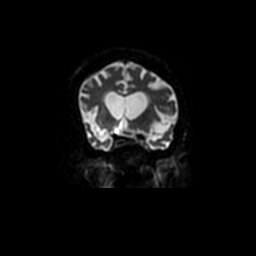
[im 33/66]
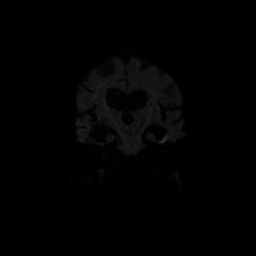
[im 44/66]
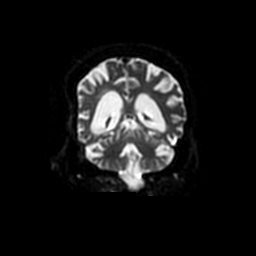
[im 55/66]
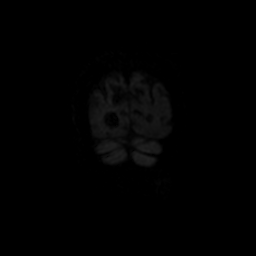
[im 66/66]
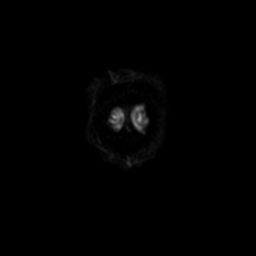

[Series 9: T2 · coronal · 5.0mm · 0.47mm/px · 3 of 26 slices shown (2 of 3)]
[im 1/26]
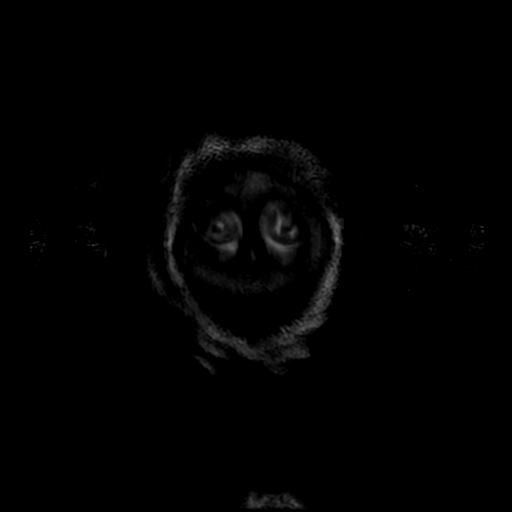
[im 13/26]
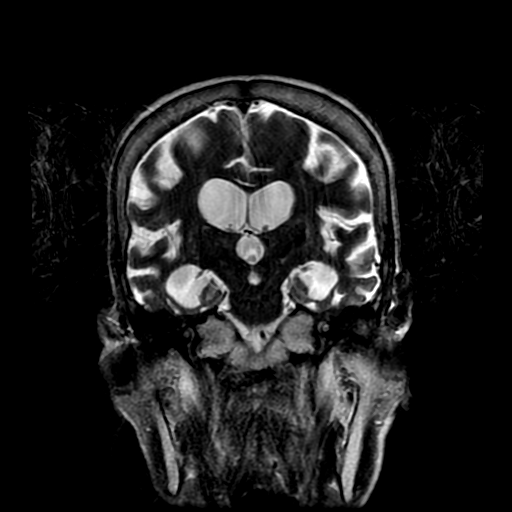
[im 26/26]
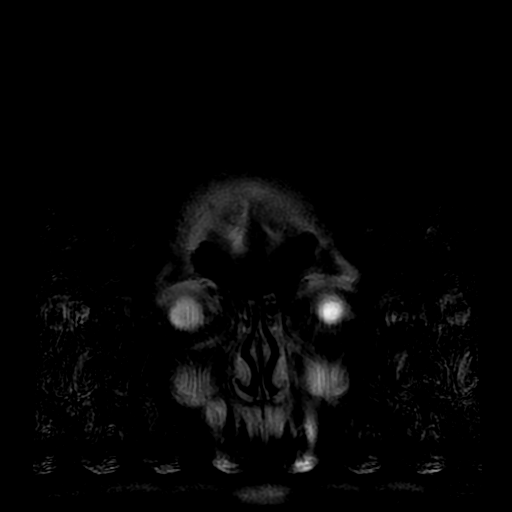

[Series 11: T2 · coronal · 3.0mm · 0.39mm/px · 3 of 30 slices shown (3 of 3)]
[im 1/30]
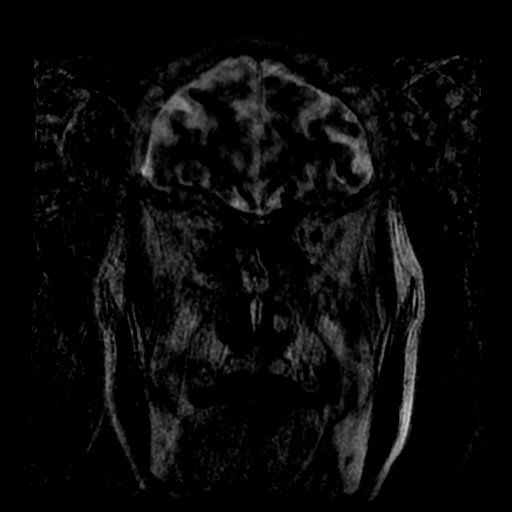
[im 15/30]
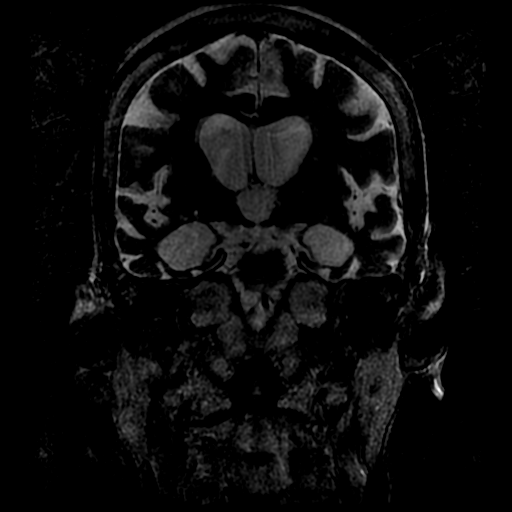
[im 30/30]
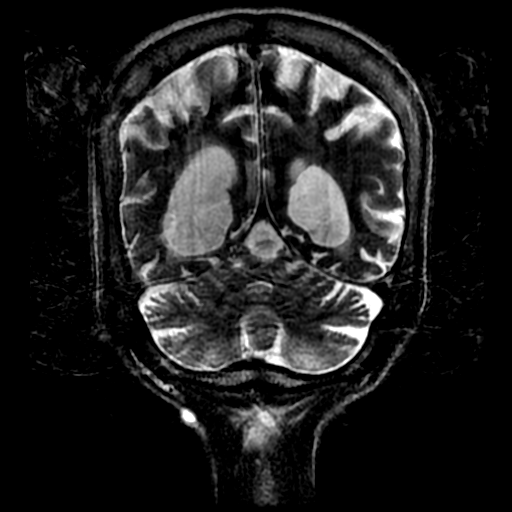

[Series 300: DWI · axial · 3.0mm · 1.09mm/px · z∈[-75,+64]mm · 5 of 48 slices shown (3 of 4)]
[im 1/48]
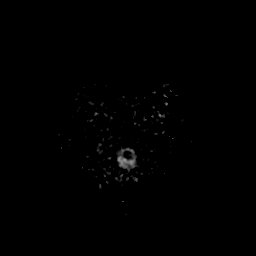
[im 12/48]
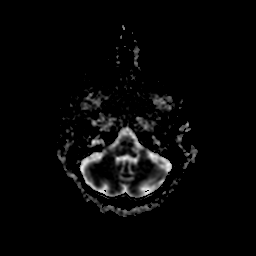
[im 24/48]
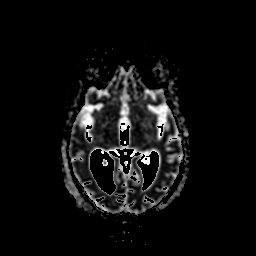
[im 36/48]
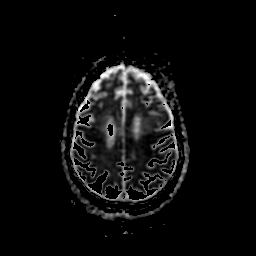
[im 48/48]
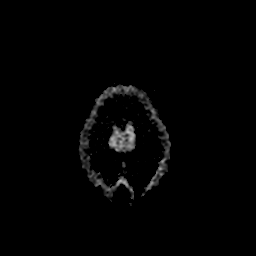

[Series 800: DWI · coronal · 5.0mm · 1.09mm/px · 3 of 33 slices shown (4 of 4)]
[im 1/33]
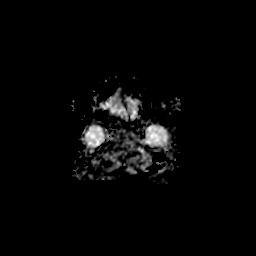
[im 17/33]
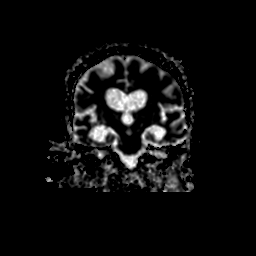
[im 33/33]
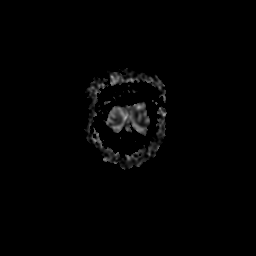

[34 of 48 positions shown; findings below may reference images not displayed]

FINDINGS: Study is mildly degraded by motion artifact despite repeated imaging
attempts.

Brain: Chronic ventricular prominence is stable since 5992, and
includes enlargement of the temporal horns. No evidence of
hyperdynamic flow at the cerebral aqueduct. No transependymal edema
suspected.

No restricted diffusion to suggest acute infarction. No midline
shift, mass effect, evidence of mass lesion, extra-axial collection
or acute intracranial hemorrhage. Cervicomedullary junction and
pituitary are within normal limits.

Mild periventricular white matter T2 and FLAIR hyperintensity. No
definite cortical encephalomalacia or chronic cerebral blood
products. Preserved signal in the deep gray matter nuclei,
brainstem, and cerebellum. There is bilateral hippocampal atrophy
which does appear disproportionate, although the degree of bilateral
temporal lobe atrophy otherwise appears fairly proportional to the
remaining brain.

Vascular: Major intracranial vascular flow voids are grossly
preserved.

Skull and upper cervical spine: Negative. Normal bone marrow signal.

Sinuses/Orbits: Postoperative changes to the globes. Otherwise
negative orbits soft tissues.

Visualized paranasal sinuses and mastoids are stable and well
pneumatized.

Other: Visible internal auditory structures appear normal. Scalp and
face soft tissues appear stable and negative.
IMPRESSION: 1. No definite acute intracranial abnormality.
2. Chronic ventriculomegaly has progressed since the earliest 3906
comparisons but is stable since 5992 and favored to be ex vacuo in
nature.
